# Patient Record
Sex: Female | Born: 1935 | Race: White | Hispanic: No | Smoking: Never smoker
Health system: Southern US, Community
[De-identification: ages and names within clinical notes are randomized; demographics above are authoritative.]

## PROBLEM LIST (undated history)

## (undated) DIAGNOSIS — J189 Pneumonia, unspecified organism: Secondary | ICD-10-CM

## (undated) DIAGNOSIS — I340 Nonrheumatic mitral (valve) insufficiency: Secondary | ICD-10-CM

## (undated) DIAGNOSIS — R569 Unspecified convulsions: Secondary | ICD-10-CM

## (undated) DIAGNOSIS — G40909 Epilepsy, unspecified, not intractable, without status epilepticus: Secondary | ICD-10-CM

## (undated) DIAGNOSIS — Z87442 Personal history of urinary calculi: Secondary | ICD-10-CM

## (undated) DIAGNOSIS — I4891 Unspecified atrial fibrillation: Secondary | ICD-10-CM

## (undated) DIAGNOSIS — K219 Gastro-esophageal reflux disease without esophagitis: Secondary | ICD-10-CM

## (undated) DIAGNOSIS — H3530 Unspecified macular degeneration: Secondary | ICD-10-CM

## (undated) DIAGNOSIS — F32A Depression, unspecified: Secondary | ICD-10-CM

## (undated) DIAGNOSIS — G8929 Other chronic pain: Secondary | ICD-10-CM

## (undated) DIAGNOSIS — M069 Rheumatoid arthritis, unspecified: Secondary | ICD-10-CM

## (undated) DIAGNOSIS — E785 Hyperlipidemia, unspecified: Secondary | ICD-10-CM

## (undated) DIAGNOSIS — C801 Malignant (primary) neoplasm, unspecified: Secondary | ICD-10-CM

## (undated) DIAGNOSIS — M1990 Unspecified osteoarthritis, unspecified site: Secondary | ICD-10-CM

## (undated) DIAGNOSIS — M353 Polymyalgia rheumatica: Secondary | ICD-10-CM

## (undated) DIAGNOSIS — F419 Anxiety disorder, unspecified: Secondary | ICD-10-CM

## (undated) DIAGNOSIS — I10 Essential (primary) hypertension: Secondary | ICD-10-CM

## (undated) DIAGNOSIS — D649 Anemia, unspecified: Secondary | ICD-10-CM

## (undated) HISTORY — PX: TONSILLECTOMY: SUR1361

## (undated) HISTORY — PX: BREAST SURGERY: SHX581

## (undated) HISTORY — PX: ABDOMINAL HYSTERECTOMY: SHX81

## (undated) HISTORY — PX: CARPAL TUNNEL RELEASE: SHX101

---

## 1998-09-08 ENCOUNTER — Encounter: Admission: RE | Admit: 1998-09-08 | Discharge: 1998-10-02 | Payer: Self-pay | Admitting: Family Medicine

## 1999-02-03 ENCOUNTER — Other Ambulatory Visit: Admission: RE | Admit: 1999-02-03 | Discharge: 1999-02-03 | Payer: Self-pay | Admitting: Family Medicine

## 2000-03-11 ENCOUNTER — Encounter: Payer: Self-pay | Admitting: Family Medicine

## 2000-03-11 ENCOUNTER — Encounter: Admission: RE | Admit: 2000-03-11 | Discharge: 2000-03-11 | Payer: Self-pay | Admitting: Family Medicine

## 2002-10-22 ENCOUNTER — Ambulatory Visit (HOSPITAL_COMMUNITY): Admission: RE | Admit: 2002-10-22 | Discharge: 2002-10-22 | Payer: Self-pay | Admitting: Gastroenterology

## 2004-11-27 ENCOUNTER — Encounter: Admission: RE | Admit: 2004-11-27 | Discharge: 2004-11-27 | Payer: Self-pay | Admitting: Family Medicine

## 2004-12-05 ENCOUNTER — Encounter: Admission: RE | Admit: 2004-12-05 | Discharge: 2004-12-05 | Payer: Self-pay | Admitting: Family Medicine

## 2005-02-01 ENCOUNTER — Encounter: Admission: RE | Admit: 2005-02-01 | Discharge: 2005-02-01 | Payer: Self-pay | Admitting: Orthopedic Surgery

## 2005-02-02 ENCOUNTER — Ambulatory Visit (HOSPITAL_COMMUNITY): Admission: RE | Admit: 2005-02-02 | Discharge: 2005-02-02 | Payer: Self-pay | Admitting: Orthopedic Surgery

## 2005-02-02 ENCOUNTER — Ambulatory Visit (HOSPITAL_BASED_OUTPATIENT_CLINIC_OR_DEPARTMENT_OTHER): Admission: RE | Admit: 2005-02-02 | Discharge: 2005-02-02 | Payer: Self-pay | Admitting: Orthopedic Surgery

## 2006-06-27 ENCOUNTER — Encounter: Admission: RE | Admit: 2006-06-27 | Discharge: 2006-06-27 | Payer: Self-pay | Admitting: Family Medicine

## 2008-04-03 ENCOUNTER — Emergency Department (HOSPITAL_COMMUNITY): Admission: EM | Admit: 2008-04-03 | Discharge: 2008-04-03 | Payer: Self-pay | Admitting: Family Medicine

## 2008-10-17 ENCOUNTER — Encounter: Admission: RE | Admit: 2008-10-17 | Discharge: 2008-10-17 | Payer: Self-pay | Admitting: Gastroenterology

## 2009-12-31 ENCOUNTER — Emergency Department (HOSPITAL_BASED_OUTPATIENT_CLINIC_OR_DEPARTMENT_OTHER): Admission: EM | Admit: 2009-12-31 | Discharge: 2009-12-31 | Payer: Self-pay | Admitting: Emergency Medicine

## 2009-12-31 ENCOUNTER — Ambulatory Visit: Payer: Self-pay | Admitting: Diagnostic Radiology

## 2010-01-03 ENCOUNTER — Ambulatory Visit (HOSPITAL_COMMUNITY): Admission: RE | Admit: 2010-01-03 | Discharge: 2010-01-03 | Payer: Self-pay | Admitting: Orthopedic Surgery

## 2010-08-22 LAB — CBC
HCT: 40.2 % (ref 36.0–46.0)
MCH: 30.9 pg (ref 26.0–34.0)
MCHC: 34 g/dL (ref 30.0–36.0)
Platelets: 157 10*3/uL (ref 150–400)
RDW: 13.7 % (ref 11.5–15.5)
WBC: 8.8 10*3/uL (ref 4.0–10.5)

## 2010-08-22 LAB — COMPREHENSIVE METABOLIC PANEL
Alkaline Phosphatase: 99 U/L (ref 39–117)
BUN: 8 mg/dL (ref 6–23)
CO2: 26 mEq/L (ref 19–32)
Chloride: 107 mEq/L (ref 96–112)
GFR calc Af Amer: >60 mL/min (ref >60)
GFR calc non Af Amer: >60 mL/min (ref >60)
Sodium: 141 mEq/L (ref 135–145)
Total Protein: 6.7 g/dL (ref 6.0–8.3)

## 2010-08-22 LAB — DIFFERENTIAL
Basophils Relative: 0 % (ref 0–1)
Eosinophils Relative: 2 % (ref 0–5)
Lymphs Abs: 4.2 10*3/uL — ABNORMAL HIGH (ref 0.7–4.0)
Monocytes Relative: 6 % (ref 3–12)
Neutro Abs: 3.9 10*3/uL (ref 1.7–7.7)

## 2010-08-22 LAB — SURGICAL PCR SCREEN

## 2010-10-23 NOTE — Op Note (Signed)
NAMEJAMESETTA, [name]             ACCOUNT NO.:  [id number]   MEDICAL RECORD NO.:  [id number]          PATIENT TYPE:  AMB   LOCATION:  DSC                          FACILITY:  [facility]   PHYSICIAN:  Leonides Grills, M.D.     DATE OF BIRTH:  1929/10/09   DATE OF PROCEDURE:  02/02/2005  DATE OF DISCHARGE:                                 OPERATIVE REPORT   PREOPERATIVE DIAGNOSIS:  Left second, third and fourth hammer toes.   POSTOPERATIVE DIAGNOSIS:  Left second, third and fourth hammer toes.   OPERATIONS:  1.  Left second, third and fourth toe metatarsophalangeal joint dorsal      capsulotomies with collateral release.  2.  Left second, third and fourth toe proximal phalanx head resections.  3.  Left second, third and fourth toe flexor digitorum longus to proximal      phalanx tendon transfers.  4.  Left second, third and fourth toe extensor digitorum brevis to extensor      digitorum longus tendon transfers.   ANESTHESIA:  General with block.   SURGEON:  Leonides Grills, M.D.   ASSISTANT:  Lianne Cure, P.A.   ESTIMATED BLOOD LOSS:  Minimal.   TOURNIQUET TIME:  Approximately 45 minutes.   COMPLICATIONS:  None.   DISPOSITION:  Stable to the PAR.   INDICATIONS:  This is a 77 year old female who has had longstanding left  hammer toe pain that was interfering with her life to the point that she  cannot do what she wants to do.  She was consented for the above procedure.  All risks, which include infection, nerve or vessel injury, persistent pain,  worse pain, prolonged recovery, stiffness, recurrence of deformity, were all  explained.  Questions were encouraged and answered.   OPERATION:  The patient was brought to the operating room and placed in  supine position after adequate general endotracheal tube anesthesia with  block was administered as well as Ancef 1 g IV piggyback.  The left lower  extremity was then prepped and draped in a sterile manner over a proximally-  placed  thigh tourniquet, and the limb was gravity-exsanguinated.  The  tourniquet was elevated to 290 mmHg.  A longitudinal incision over the  second toe was then made.  Dissection was carried down through skin, and  hemostasis was obtained.  The extensor digitorum longus and brevis tendons  were identified and tenotomized proximal medial and the brevis distal  lateral and retracted out of harm's way for later transfer.  MTP joint  dorsal capsulotomy was then performed with a 15 blade scalpel, protecting  soft tissues both medially and lateral by our system, and collateral  ligaments were released as well.  The distal aspect of the proximal phalanx  was then skeletonized sharply, protecting the soft tissues medially and  laterally, and the head was then removed with a rongeur, followed by a bone  cutter.  The FDL tendon was then identified by a longitudinal incision in  the plantar plate and once this was identified, this was tenotomized as  distal as possible for later transfer.  We then made consecutive drill  holes, 2.5-3.2 mm drill holes in the base of the proximal phalanx.  The FDL  tendon was then pulled from plantar to dorsal through the drill hole.  We  then placed a 0.045 K-wire antegrade through the middle and distal phalanx,  reduced the PIP joint with the toe held in a reduced position with the ankle  in neutral dorsiflexion and tension on the FDL tendon through the drill  hole.  The K-wire was then fired across the MTP joint.  This held the toe in  excellent position.  We then completed the transfer with the EDB to EDL  using 3-0 PDS suture, and this was sewn to the stump of the FDL tendon as  well.  This had outstanding repair.  We did the same exact procedure to the  third and fourth toes, respectively, through separate incisions.  Once all  toes were completed, the tourniquet was deflated and hemostasis was  obtained.  The toes pinked up nicely and bleeding out the tip of the toe.   Skin-relieving incisions were made on either side of the K-wire.  K-wires  were bent, cut and capped.  Wounds were closed with 4-0 nylon suture over  all wounds.  Sterile dressing was applied and a hard-soled shoe was applied.  The patient was stable to the PAR.      Leonides Grills, M.D.  Electronically Signed     PB/MEDQ  D:  02/02/2005  T:  02/02/2005  Job:  [id number]

## 2010-10-23 NOTE — Op Note (Signed)
   [name], [name]                       ACCOUNT NO.:  [id number]   MEDICAL RECORD NO.:  [id number]                   PATIENT TYPE:  AMB   LOCATION:  ENDO                                 FACILITY:  [facility]   PHYSICIAN:  John C. Madilyn Fireman, M.D.                 DATE OF BIRTH:  1929/08/26   DATE OF PROCEDURE:  10/22/2002  DATE OF DISCHARGE:                                 OPERATIVE REPORT   PROCEDURE:  Colonoscopy.   INDICATIONS FOR PROCEDURE:  Colon cancer screening.   DESCRIPTION OF PROCEDURE:  The patient was placed in the left lateral  decubitus position then placed on the pulse monitor with continuous low flow  oxygen delivered by nasal cannula. She was sedated with 50 mcg IV fentanyl  and 6 mg IV Versed. The Olympus video colonoscope was inserted into the  rectum and advanced to the cecum, confirmed by transillumination at  McBurney's point and visualization of the ileocecal valve and appendiceal  orifice. The prep was excellent. The cecum, ascending, transverse,  descending and sigmoid colon all appeared normal with no masses, polyps,  diverticula or other mucosal abnormalities. The rectum likewise appeared  normal and retroflexed view of the anus revealed no obvious internal  hemorrhoids. The colonoscope was then withdrawn and the patient returned to  the recovery room in stable condition. The patient tolerated the procedure  well and there were no immediate complications.   IMPRESSION:  Normal screening colonoscopy.   PLAN:  Next colon screening by sigmoidoscopy in five years.                                               John C. Madilyn Fireman, M.D.    JCH/MEDQ  D:  10/22/2002  T:  10/22/2002  Job:  [id number]   cc:   Talmadge Coventry, M.D.  [address], [address]  [place]  [place] [address]  Fax: [phone]

## 2013-03-07 ENCOUNTER — Other Ambulatory Visit: Payer: Self-pay | Admitting: Gastroenterology

## 2013-03-07 DIAGNOSIS — R109 Unspecified abdominal pain: Secondary | ICD-10-CM

## 2013-03-09 ENCOUNTER — Ambulatory Visit
Admission: RE | Admit: 2013-03-09 | Discharge: 2013-03-09 | Disposition: A | Discharge status: Home / Self Care | Payer: Medicare Other | Source: Ambulatory Visit | Attending: Gastroenterology | Admitting: Gastroenterology

## 2013-03-09 DIAGNOSIS — R109 Unspecified abdominal pain: Secondary | ICD-10-CM

## 2014-11-28 ENCOUNTER — Other Ambulatory Visit: Payer: Self-pay | Admitting: Gastroenterology

## 2014-11-28 DIAGNOSIS — R1310 Dysphagia, unspecified: Secondary | ICD-10-CM

## 2014-12-04 ENCOUNTER — Ambulatory Visit
Admission: RE | Admit: 2014-12-04 | Discharge: 2014-12-04 | Disposition: A | Discharge status: Home / Self Care | Payer: Medicare Other | Source: Ambulatory Visit | Attending: Gastroenterology | Admitting: Gastroenterology

## 2014-12-04 DIAGNOSIS — R1310 Dysphagia, unspecified: Secondary | ICD-10-CM

## 2015-02-11 ENCOUNTER — Encounter (INDEPENDENT_AMBULATORY_CARE_PROVIDER_SITE_OTHER): Payer: Medicare Other | Admitting: Ophthalmology

## 2015-02-11 DIAGNOSIS — H3531 Nonexudative age-related macular degeneration: Secondary | ICD-10-CM

## 2015-02-11 DIAGNOSIS — H43813 Vitreous degeneration, bilateral: Secondary | ICD-10-CM

## 2017-05-23 ENCOUNTER — Other Ambulatory Visit: Payer: Self-pay | Admitting: Physician Assistant

## 2017-05-23 DIAGNOSIS — R1012 Left upper quadrant pain: Secondary | ICD-10-CM

## 2017-05-23 DIAGNOSIS — R1011 Right upper quadrant pain: Secondary | ICD-10-CM

## 2017-05-27 ENCOUNTER — Ambulatory Visit
Admission: RE | Admit: 2017-05-27 | Discharge: 2017-05-27 | Disposition: A | Discharge status: Home / Self Care | Payer: Medicare Other | Source: Ambulatory Visit | Attending: Physician Assistant | Admitting: Physician Assistant

## 2017-05-27 DIAGNOSIS — R1011 Right upper quadrant pain: Secondary | ICD-10-CM

## 2017-05-27 DIAGNOSIS — R1012 Left upper quadrant pain: Secondary | ICD-10-CM

## 2018-07-09 ENCOUNTER — Encounter (HOSPITAL_BASED_OUTPATIENT_CLINIC_OR_DEPARTMENT_OTHER): Payer: Self-pay | Admitting: Emergency Medicine

## 2018-07-09 ENCOUNTER — Emergency Department (HOSPITAL_BASED_OUTPATIENT_CLINIC_OR_DEPARTMENT_OTHER)
Admission: EM | Admit: 2018-07-09 | Discharge: 2018-07-09 | Disposition: A | Discharge status: Home / Self Care | Payer: Medicare Other | Attending: Emergency Medicine | Admitting: Emergency Medicine

## 2018-07-09 ENCOUNTER — Other Ambulatory Visit: Payer: Self-pay

## 2018-07-09 ENCOUNTER — Emergency Department (HOSPITAL_BASED_OUTPATIENT_CLINIC_OR_DEPARTMENT_OTHER): Payer: Medicare Other

## 2018-07-09 DIAGNOSIS — W010XXA Fall on same level from slipping, tripping and stumbling without subsequent striking against object, initial encounter: Secondary | ICD-10-CM | POA: Diagnosis not present

## 2018-07-09 DIAGNOSIS — Y929 Unspecified place or not applicable: Secondary | ICD-10-CM | POA: Diagnosis not present

## 2018-07-09 DIAGNOSIS — S86911A Strain of unspecified muscle(s) and tendon(s) at lower leg level, right leg, initial encounter: Secondary | ICD-10-CM | POA: Insufficient documentation

## 2018-07-09 DIAGNOSIS — S8001XA Contusion of right knee, initial encounter: Secondary | ICD-10-CM | POA: Insufficient documentation

## 2018-07-09 DIAGNOSIS — Y999 Unspecified external cause status: Secondary | ICD-10-CM | POA: Diagnosis not present

## 2018-07-09 DIAGNOSIS — S0083XA Contusion of other part of head, initial encounter: Secondary | ICD-10-CM | POA: Diagnosis not present

## 2018-07-09 DIAGNOSIS — Y939 Activity, unspecified: Secondary | ICD-10-CM | POA: Insufficient documentation

## 2018-07-09 DIAGNOSIS — W19XXXA Unspecified fall, initial encounter: Secondary | ICD-10-CM

## 2018-07-09 DIAGNOSIS — S0990XA Unspecified injury of head, initial encounter: Secondary | ICD-10-CM

## 2018-07-09 MED ORDER — ONDANSETRON 4 MG PO TBDP
4.0000 mg | ORAL_TABLET | Freq: Once | ORAL | Status: AC
  Administered 2018-07-09: 4 mg via ORAL
  Filled 2018-07-09: qty 1

## 2018-07-09 MED ORDER — ACETAMINOPHEN 500 MG PO TABS: 1000.0000 mg | ORAL_TABLET | Freq: Four times a day (QID) | ORAL | 0 refills | Status: AC | PRN

## 2018-07-09 MED ORDER — ACETAMINOPHEN 500 MG PO TABS
1000.0000 mg | ORAL_TABLET | Freq: Once | ORAL | Status: AC
  Administered 2018-07-09: 1000 mg via ORAL
  Filled 2018-07-09: qty 2

## 2018-07-09 MED ORDER — ONDANSETRON 4 MG PO TBDP: 4.0000 mg | ORAL_TABLET | ORAL | 0 refills | Status: DC | PRN

## 2018-07-09 NOTE — ED Notes (Signed)
Patient transported to CT 

## 2018-07-09 NOTE — ED Notes (Signed)
ED Provider at bedside. 

## 2018-07-09 NOTE — ED Triage Notes (Signed)
Pt had fall on ice this morning and hit her head and right knee. Patient denies LOC with no blood thinners.

## 2018-07-09 NOTE — ED Provider Notes (Signed)
[facility] EMERGENCY DEPARTMENT Provider Note   CSN: [id number] Arrival date & time: 07/09/18  2376     History   Chief Complaint Chief Complaint  Patient presents with  . Fall    HPI [name] is a 85 y.o. female.  HPI Patient slipped on an outside deck incline.  She fell off the edge and struck her head with no loss of consciousness.  She reports she does have generalized headache.  Patient feels nauseated.  No visual changes.  No neck pain.  No weakness numbness or tingling of extremities.  Patient reports she also hit her right knee.  She reports her right knee is painful.  No pain at the hip or lower back.  Patient has been ambulatory.  No anticoagulants. History reviewed. No pertinent past medical history.  There are no active problems to display for this patient.   History reviewed. No pertinent surgical history.   OB History   No obstetric history on file.      Home Medications    Prior to Admission medications   Medication Sig Start Date End Date Taking? Authorizing Provider  amLODipine (NORVASC) 5 MG tablet Take by mouth. 07/03/16  Yes [provider]  celecoxib (CELEBREX) 200 MG capsule Take by mouth. 05/11/16  Yes [provider]  fluticasone (FLONASE) 50 MCG/ACT nasal spray Place into the nose. 02/03/18 02/03/19 Yes [provider]  pantoprazole (PROTONIX) 40 MG tablet Take by mouth. 01/14/14  Yes [provider]  PHENobarbital (LUMINAL) 97.2 MG tablet Take by mouth. 05/17/16  Yes [provider]  predniSONE (DELTASONE) 5 MG tablet Take by mouth. 02/22/18  Yes [provider]  acetaminophen (TYLENOL) 500 MG tablet Take 2 tablets (1,000 mg total) by mouth every 6 (six) hours as needed. 07/09/18   Charlesetta Shanks, MD  Multiple Vitamin (MULTIVITAMIN) tablet Take by mouth.    [provider]  ondansetron (ZOFRAN ODT) 4 MG disintegrating tablet Take 1 tablet (4 mg total) by mouth every 4  (four) hours as needed for nausea or vomiting. 07/09/18   Charlesetta Shanks, MD    Family History History reviewed. No pertinent family history.  Social History Social History   Tobacco Use  . Smoking status: Never Smoker  . Smokeless tobacco: Never Used  Substance Use Topics  . Alcohol use: Never    Frequency: Never  . Drug use: Never     Allergies   Penicillins   Review of Systems Review of Systems 10 Systems reviewed and are negative for acute change except as noted in the HPI.   Physical Exam Updated Vital Signs BP (!) 165/67 (BP Location: Left Arm)   Pulse 60   Temp 98.3 F (36.8 C) (Oral)   Resp 16   Ht 5' 2.5" (1.588 m)   Wt 68 kg   LMP  (LMP Unknown)   SpO2 98%   BMI 27.00 kg/m   Physical Exam Constitutional:      Appearance: She is well-developed.  HENT:     Head:     Comments: 4 cm hematoma to right forehead.  Superficial overlying abrasion.  No other facial trauma.    Nose: Nose normal.     Mouth/Throat:     Mouth: Mucous membranes are moist.     Pharynx: Oropharynx is clear.  Eyes:     Pupils: Pupils are equal, round, and reactive to light.  Neck:     Musculoskeletal: Neck supple.     Comments: No C-spine  tenderness to palpation. Cardiovascular:     Rate and Rhythm: Normal rate and regular rhythm.     Heart sounds: Normal heart sounds.  Pulmonary:     Effort: Pulmonary effort is normal.     Breath sounds: Normal breath sounds.  Abdominal:     General: Bowel sounds are normal. There is no distension.     Palpations: Abdomen is soft.     Tenderness: There is no abdominal tenderness.  Musculoskeletal: Normal range of motion.     Comments: Normal range of motion at the right hip and ankle.  Patient has pain to palpation along the right joint line.  No visible effusion at this time.  Minor contusion to the anterior aspect of the knee without swelling at this time.  Lower leg is soft and nontender.  Skin:    General: Skin is warm and dry.    Neurological:     General: No focal deficit present.     Mental Status: She is alert and oriented to person, place, and time.     GCS: GCS eye subscore is 4. GCS verbal subscore is 5. GCS motor subscore is 6.     Motor: No weakness.     Coordination: Coordination normal.  Psychiatric:        Mood and Affect: Mood normal.      ED Treatments / Results  Labs (all labs ordered are listed, but only abnormal results are displayed) Labs Reviewed - No data to display  EKG None  Radiology Ct Head Wo Contrast  Result Date: 07/09/2018 CLINICAL DATA:  Fall, right forehead injury EXAM: CT HEAD WITHOUT CONTRAST TECHNIQUE: Contiguous axial images were obtained from the base of the skull through the vertex without intravenous contrast. COMPARISON:  05/25/2012 FINDINGS: Brain: No evidence of acute infarction, hemorrhage, hydrocephalus, extra-axial collection or mass lesion/mass effect. Mild cortical atrophy. Old right basal ganglia lacunar infarct. Mild subcortical white matter and periventricular small vessel ischemic changes. Vascular: Mild intracranial atherosclerosis. Skull: Normal. Negative for fracture or focal lesion. Sinuses/Orbits: The visualized paranasal sinuses are essentially clear. The mastoid air cells are unopacified. Other: None. IMPRESSION: No evidence of acute intracranial abnormality. Mild atrophy with small vessel ischemic changes. Old right basal ganglia lacunar infarct. Electronically Signed   By: Julian Hy M.D.   On: 07/09/2018 11:09   Dg Knee Complete 4 Views Right  Result Date: 07/09/2018 CLINICAL DATA:  Fall EXAM: RIGHT KNEE - COMPLETE 4+ VIEW COMPARISON:  None. FINDINGS: No acute fracture. No dislocation. Unremarkable soft tissues. Moderate tricompartment osteoarthritic change. Small joint effusion. IMPRESSION: No acute bony pathology. Electronically Signed   By: Marybelle Killings M.D.   On: 07/09/2018 11:06    Procedures Procedures (including critical care  time)  Medications Ordered in ED Medications  acetaminophen (TYLENOL) tablet 1,000 mg (1,000 mg Oral Given 07/09/18 1036)  ondansetron (ZOFRAN-ODT) disintegrating tablet 4 mg (4 mg Oral Given 07/09/18 1037)     Initial Impression / Assessment and Plan / ED Course  I have reviewed the triage vital signs and the nursing notes.  Pertinent labs & imaging results that were available during my care of the patient were reviewed by me and considered in my medical decision making (see chart for details).     Patient has mechanical fall with head injury without loss of consciousness not on anticoagulants.  Normal mental status.  Recommend Tylenol for headache.  Follow-up plan reviewed.  Return precautions reviewed.  Patient also has right knee sprain.  Reviewed management  and follow-up plan.  Final Clinical Impressions(s) / ED Diagnoses   Final diagnoses:  Fall, initial encounter  Contusion of face, initial encounter  Minor head injury, initial encounter  Contusion of right knee, initial encounter  Strain of right knee, initial encounter    ED Discharge Orders         Ordered    acetaminophen (TYLENOL) 500 MG tablet  Every 6 hours PRN     07/09/18 1140    ondansetron (ZOFRAN ODT) 4 MG disintegrating tablet  Every 4 hours PRN     07/09/18 1140           Charlesetta Shanks, MD 07/09/18 1143

## 2018-12-28 ENCOUNTER — Ambulatory Visit: Payer: Medicare Other | Attending: Specialist | Admitting: Physical Therapy

## 2018-12-28 ENCOUNTER — Encounter: Payer: Self-pay | Admitting: Physical Therapy

## 2018-12-28 ENCOUNTER — Other Ambulatory Visit: Payer: Self-pay

## 2018-12-28 DIAGNOSIS — R262 Difficulty in walking, not elsewhere classified: Secondary | ICD-10-CM | POA: Insufficient documentation

## 2018-12-28 DIAGNOSIS — R2681 Unsteadiness on feet: Secondary | ICD-10-CM | POA: Diagnosis present

## 2018-12-28 DIAGNOSIS — M25561 Pain in right knee: Secondary | ICD-10-CM | POA: Insufficient documentation

## 2018-12-28 DIAGNOSIS — M6281 Muscle weakness (generalized): Secondary | ICD-10-CM | POA: Insufficient documentation

## 2018-12-28 DIAGNOSIS — M25661 Stiffness of right knee, not elsewhere classified: Secondary | ICD-10-CM | POA: Diagnosis present

## 2018-12-28 NOTE — Therapy (Signed)
[facility] [address]  [place], [place], [address] Phone: [phone]   Fax:  [phone]  Physical Therapy Evaluation  Patient Details  Name: [name] MRN: [id number] Date of Birth: 07-26-1929 Referring Provider (PT): Sydnee Cabal, MD   Encounter Date: 12/28/2018  PT End of Session - 12/28/18 1149    Visit Number  1    Number of Visits  13    Date for PT Re-Evaluation  02/08/19    Authorization Type  [employer] Medicare    PT Start Time  1105    PT Stop Time  1144    PT Time Calculation (min)  39 min    Activity Tolerance  Patient tolerated treatment well;Patient limited by pain    Behavior During Therapy  [facility] for tasks assessed/performed       History reviewed. No pertinent past medical history.  History reviewed. No pertinent surgical history.  There were no vitals filed for this visit.   Subjective Assessment - 12/28/18 1106    Subjective  Patient reports that this past Saturday she fell, landing on R knee and bumping her head. Had to have her daughter to help her get up from the floor. Denies HA, dizziness, nausea/vomiting since the fall. R knee is bruised and painful in medial and lateral aspects of knee. Worse with prolonged walking, stairs. Better with ice and elevation. Reports her balance is not good- feels wobbly when walking to her mailbox, trying to walk in a straight line.    Pertinent History  RA, GERD, epilepsy, anemia, HLD, polymyalgia rheumatica, HTN, hx breast CA    Limitations  Lifting;Standing;Walking;House hold activities    How long can you sit comfortably?  unlimited    How long can you stand comfortably?  1 hour    How long can you walk comfortably?  1/4 mile    Diagnostic tests  per patient- had xray which showed arthritis    Patient Stated Goals  get better at walking and keep that joint mobile    Currently in Pain?  Yes    Pain Score  0-No pain    Pain Location  Knee    Pain Orientation  Right;Medial;Lateral    Pain Type  Acute pain         [facility] PT Assessment - 12/28/18 1118      Assessment   Medical Diagnosis  Pain in R knee    Referring Provider (PT)  Sydnee Cabal, MD    Onset Date/Surgical Date  12/23/18    Next MD Visit  02/05/19    Prior Therapy  yes      Balance Screen   Has the patient fallen in the past 6 months  Yes    How many times?  1    Has the patient had a decrease in activity level because of a fear of falling?   No    Is the patient reluctant to leave their home because of a fear of falling?   No      Home Environment   Living Environment  Private residence    Living Arrangements  Alone    Available Help at Discharge  Family    Type of [place] - single point;Cane - quad;Walker - 4 wheels;Transport chair;Wheelchair - power;Shower seat  Prior Function   Level of Independence  Independent    Vocation  Retired    Leisure  walking for exercise      Cognition   Overall Cognitive Status  Within Functional Limits for tasks assessed      Observation/Other Assessments   Observations  bruising to lateral aspect of R patella; edema to medial aspect    Focus on Therapeutic Outcomes (FOTO)   Knee: 56 (44% limited, 32% predicted)      Sensation   Light Touch  Appears Intact      Coordination   Gross Motor Movements are Fluid and Coordinated  Yes      Posture/Postural Control   Posture/Postural Control  Postural limitations    Postural Limitations  Rounded Shoulders;Forward head;Weight shift left;Posterior pelvic tilt      ROM / Strength   AROM / PROM / Strength  AROM;PROM;Strength      AROM   AROM Assessment Site  Knee    Right/Left Knee  Left;Right    Right Knee Extension  3   pain   Right Knee Flexion  114   pain   Left Knee Extension  0    Left Knee Flexion  120      PROM   PROM Assessment Site  Knee    Right/Left Knee   Left;Right    Right Knee Extension  1   pain   Right Knee Flexion  115   pain   Left Knee Extension  0    Left Knee Flexion  123      Strength   Strength Assessment Site  Hip;Knee;Ankle    Right/Left Hip  Right;Left    Right Hip Flexion  4+/5    Right Hip ABduction  4/5    Right Hip ADduction  4/5    Left Hip Flexion  4/5    Left Hip ABduction  4+/5    Left Hip ADduction  4/5    Right/Left Knee  Right;Left    Right Knee Flexion  4/5    Right Knee Extension  4+/5    Left Knee Flexion  4-/5    Left Knee Extension  4+/5    Right/Left Ankle  Right;Left    Right Ankle Dorsiflexion  4+/5    Right Ankle Plantar Flexion  4/5    Left Ankle Dorsiflexion  4+/5    Left Ankle Plantar Flexion  4/5      Palpation   Patella mobility  R patella hypomobile in all directions    Palpation comment  R medial and lateral jt line tenderness as well as pes anserine and lateral aspect of knee      Ambulation/Gait   Assistive device  None    Gait Pattern  Step-to pattern;Step-through pattern;Decreased stance time - right;Decreased step length - left;Decreased hip/knee flexion - right;Decreased weight shift to right;Antalgic   increased R knee valgus and ankle pronation on R   Ambulation Surface  Level;Indoor    Gait velocity  decreased                Objective measurements completed on examination: See above findings.              PT Education - 12/28/18 1149    Education Details  prognosis, POC, HEP    Person(s) Educated  Patient    Methods  Explanation;Demonstration;Tactile cues;Verbal cues;Handout    Comprehension  Verbalized understanding;Returned demonstration       PT Short Term Goals - 12/28/18  [place] #1   Title  Patient to be independent with initial HEP.    Time  3    Period  Weeks    Status  New    Target Date  01/18/19        PT Long Term Goals - 12/28/18 1155      PT LONG TERM GOAL #1   Title  Patient to be independent with  advanced HEP.    Time  6    Period  Weeks    Status  New    Target Date  02/08/19      PT LONG TERM GOAL #2   Title  Patient to demonstrate >=4+/5 strength in B LEs.    Time  6    Period  Weeks    Status  New    Target Date  02/08/19      PT LONG TERM GOAL #3   Title  Patient to demonstrate R knee AROM nonpainful and symmetrical to opposite LE.    Time  6    Period  Weeks    Status  New    Target Date  02/08/19      PT LONG TERM GOAL #4   Title  Patient to demonstrate floor to stand transfer with min A.    Time  6    Period  Weeks    Status  New    Target Date  02/08/19      PT LONG TERM GOAL #5   Title  Patient to score >19/24 on DGI to decrease risk of falls.    Time  6    Period  Weeks    Status  New    Target Date  02/08/19             Plan - 12/28/18 1149    Clinical Impression Statement  Patient is an 85y/o F presenting to OPPT with c/o R knee pain after a fall on 12/23/18. Reports pain is located over medial and lateral aspects of knee. Worse with prolonged walking, stairs. Also notes that her balance is not good- feels wobbly when walking to her mailbox and trying to walk in a straight line. Patient today with visible bruising and edema to R knee, decreased and painful R knee ROM, decreased LE strength, decreased R patellar mobility, and gait deviations. Educated patient on gentle strengthening, stretching, and balance HEP- advised patient to use a chair for standing exercises d/t patient's c/o buckling and catching in her knee. Patient reported understanding. Would benefit form skilled PT services 2x/week for 6 weeks to address aforementioned impairments.    Personal Factors and Comorbidities  Age;Comorbidity 3+;Time since onset of injury/illness/exacerbation;Past/Current Experience;Fitness    Comorbidities  RA, GERD, epilepsy, anemia, HLD, polymyalgia rheumatica, HTN, hx breast CA    Examination-Activity Limitations  Bathing;Bend;Squat;Caring for  Others;Stairs;Carry;Stand;Lift;Locomotion Level;Reach Overhead;Transfers    Examination-Participation Restrictions  Church;Cleaning;Shop;Community Activity;Driving;Yard Work;Interpersonal Relationship;Laundry;Meal Prep    Stability/Clinical Decision Making  Stable/Uncomplicated    Clinical Decision Making  Low    Rehab Potential  Good    PT Frequency  2x / week    PT Duration  6 weeks    PT Treatment/Interventions  ADLs/Self Care Home Management;Cryotherapy;Electrical Stimulation;Iontophoresis 4mg /ml Dexamethasone;Moist Heat;Balance training;Therapeutic exercise;Therapeutic activities;Functional mobility training;Stair training;Gait training;Ultrasound;Neuromuscular re-education;Patient/family education;Manual techniques;Vasopneumatic Device;Taping;Energy conservation;Dry needling;Passive range of motion    PT Next Visit Plan  reassess HEP; DGI    Consulted and Agree with Plan of Care  Patient       Patient will benefit from skilled therapeutic intervention in order to improve the following deficits and impairments:  Abnormal gait, Hypomobility, Increased edema, Decreased activity tolerance, Decreased strength, Pain, Difficulty walking, Decreased balance, Decreased range of motion, Improper body mechanics, Postural dysfunction, Impaired flexibility  Visit Diagnosis: 1. Acute pain of right knee   2. Stiffness of right knee, not elsewhere classified   3. Muscle weakness (generalized)   4. Difficulty in walking, not elsewhere classified   5. Unsteadiness on feet        Problem List There are no active problems to display for this patient.   Janene Harvey, PT, DPT 12/28/18 11:58 AM   [facility] [address]  [place], [place], [address] Phone: [phone]   Fax:  [phone]  Name: [name] MRN: [id number] Date of Birth: [birth year]

## 2019-01-02 ENCOUNTER — Other Ambulatory Visit: Payer: Self-pay

## 2019-01-02 ENCOUNTER — Other Ambulatory Visit: Payer: Self-pay | Admitting: Chiropractic Medicine

## 2019-01-02 ENCOUNTER — Ambulatory Visit: Payer: Medicare Other

## 2019-01-02 DIAGNOSIS — M25661 Stiffness of right knee, not elsewhere classified: Secondary | ICD-10-CM

## 2019-01-02 DIAGNOSIS — M5416 Radiculopathy, lumbar region: Secondary | ICD-10-CM

## 2019-01-02 DIAGNOSIS — M6281 Muscle weakness (generalized): Secondary | ICD-10-CM

## 2019-01-02 DIAGNOSIS — M25561 Pain in right knee: Secondary | ICD-10-CM

## 2019-01-02 DIAGNOSIS — R2681 Unsteadiness on feet: Secondary | ICD-10-CM

## 2019-01-02 DIAGNOSIS — R262 Difficulty in walking, not elsewhere classified: Secondary | ICD-10-CM

## 2019-01-02 NOTE — Therapy (Signed)
[facility] [address]  [place], [place], [address] Phone: [phone]   Fax:  [phone]  Physical Therapy Treatment  Patient Details  Name: [name] MRN: [id number] Date of Birth: [birth year] Referring Provider (PT): Sydnee Cabal, MD   Encounter Date: 01/02/2019  PT End of Session - 01/02/19 1118    Visit Number  2    Number of Visits  13    Date for PT Re-Evaluation  02/08/19    Authorization Type  UHC Medicare    PT Start Time  1103    PT Stop Time  1200    PT Time Calculation (min)  57 min    Activity Tolerance  Patient tolerated treatment well;Patient limited by pain    Behavior During Therapy  [facility] for tasks assessed/performed       No past medical history on file.  No past surgical history on file.  There were no vitals filed for this visit.  Subjective Assessment - 01/02/19 1120    Subjective  Doing well.  Pt. reporting she was able to walk around [employer] yesterday for 45 min    Pertinent History  RA, GERD, epilepsy, anemia, HLD, polymyalgia rheumatica, HTN, hx breast CA    Diagnostic tests  per patient- had xray which showed arthritis    Patient Stated Goals  get better at walking and keep that joint mobile    Currently in Pain?  No/denies    Pain Score  0-No pain         [facility] PT Assessment - 01/02/19 0001      AROM   AROM Assessment Site  Knee    Right/Left Knee  Left;Right    Right Knee Flexion  124      Standardized Balance Assessment   Standardized Balance Assessment  Dynamic Gait Index      Dynamic Gait Index   Level Surface  Normal    Change in Gait Speed  Mild Impairment    Gait with Horizontal Head Turns  Mild Impairment    Gait with Vertical Head Turns  Normal    Gait and Pivot Turn  Mild Impairment    Step Over Obstacle  Mild Impairment    Step Around Obstacles  Mild Impairment    Steps  Moderate Impairment    Total Score  17    DGI comment:  17/24                    [facility] Adult PT Treatment/Exercise - 01/02/19 0001      Knee/Hip Exercises: Stretches   Hip Flexor Stretch  Right;30 seconds;2 reps    Hip Flexor Stretch Limitations  mod thomas position with strap       Knee/Hip Exercises: Supine   Quad Sets  Right;10 reps;Strengthening    Quad Sets Limitations  5" hold     Heel Slides  Right;10 reps;AROM    Heel Slides Limitations  towel around foot    Straight Leg Raises  Right;10 reps;Strengthening    Straight Leg Raises Limitations  no visible quad lag    Knee Flexion  Right;10 reps;AAROM    Knee Flexion Limitations  peanut p-ball with strap       Modalities   Modalities  Vasopneumatic      Vasopneumatic   Number Minutes Vasopneumatic   10 minutes    Vasopnuematic Location   Knee    Vasopneumatic Pressure  Low  Vasopneumatic Temperature   coldest temp.       Manual Therapy   Manual Therapy  Joint mobilization    Manual therapy comments  supine    Joint Mobilization  R patellar mobs all directions - min limited superior/inferior               PT Short Term Goals - 01/02/19 1118      PT SHORT TERM GOAL #1   Title  Patient to be independent with initial HEP.    Time  3    Period  Weeks    Status  On-going    Target Date  01/18/19        PT Long Term Goals - 01/02/19 1119      PT LONG TERM GOAL #1   Title  Patient to be independent with advanced HEP.    Time  6    Period  Weeks    Status  On-going      PT LONG TERM GOAL #2   Title  Patient to demonstrate >=4+/5 strength in B LEs.    Time  6    Period  Weeks    Status  On-going      PT LONG TERM GOAL #3   Title  Patient to demonstrate R knee AROM nonpainful and symmetrical to opposite LE.    Time  6    Period  Weeks    Status  On-going      PT LONG TERM GOAL #4   Title  Patient to demonstrate floor to stand transfer with min A.    Time  6    Period  Weeks    Status  On-going      PT LONG TERM GOAL #5   Title  Patient to score  >19/24 on DGI to decrease risk of falls.    Time  6    Period  Weeks    Status  On-going            Plan - 01/02/19 1212    Clinical Impression Statement  [place] reporting some improvement in R knee pain since starting therapy.  Reviewed HEP with only min correction required for proper technique with quad set.  Pt. demonstrating some unsteadiness with multitasking with gait as evident with score of 17/24 on Dynamic Gait Index today. Pt. most unsteady with step-over activities and horizontal head turns with overt stray off straight line gait pattern.  Was able to demonstrate much improved R knee flexion ROM to 124 dg today and able to demo AROM extension to 0dg with quad set.  progressing well toward established goals.    Personal Factors and Comorbidities  Age;Comorbidity 3+;Time since onset of injury/illness/exacerbation;Past/Current Experience;Fitness    Comorbidities  RA, GERD, epilepsy, anemia, HLD, polymyalgia rheumatica, HTN, hx breast CA    Rehab Potential  Good    PT Treatment/Interventions  ADLs/Self Care Home Management;Cryotherapy;Electrical Stimulation;Iontophoresis 4mg /ml Dexamethasone;Moist Heat;Balance training;Therapeutic exercise;Therapeutic activities;Functional mobility training;Stair training;Gait training;Ultrasound;Neuromuscular re-education;Patient/family education;Manual techniques;Vasopneumatic Device;Taping;Energy conservation;Dry needling;Passive range of motion    PT Next Visit Plan  Review tandem balance at counter top    Consulted and Agree with Plan of Care  Patient       Patient will benefit from skilled therapeutic intervention in order to improve the following deficits and impairments:  Abnormal gait, Hypomobility, Increased edema, Decreased activity tolerance, Decreased strength, Pain, Difficulty walking, Decreased balance, Decreased range of motion, Improper body mechanics, Postural dysfunction, Impaired flexibility  Visit Diagnosis: 1. Acute pain  of  right knee   2. Stiffness of right knee, not elsewhere classified   3. Muscle weakness (generalized)   4. Difficulty in walking, not elsewhere classified   5. Unsteadiness on feet        Problem List There are no active problems to display for this patient.   Bess Harvest, PTA 01/02/19 12:26 PM   [facility] [address]  [place], [place], [address] Phone: [phone]   Fax:  [phone]  Name: YAHIRA TIMBERMAN MRN: [id number] Date of Birth: [birth year]

## 2019-01-04 ENCOUNTER — Other Ambulatory Visit: Payer: Self-pay

## 2019-01-04 ENCOUNTER — Encounter: Payer: Self-pay | Admitting: Physical Therapy

## 2019-01-04 ENCOUNTER — Ambulatory Visit: Payer: Medicare Other | Admitting: Physical Therapy

## 2019-01-04 DIAGNOSIS — M25561 Pain in right knee: Secondary | ICD-10-CM

## 2019-01-04 DIAGNOSIS — M6281 Muscle weakness (generalized): Secondary | ICD-10-CM

## 2019-01-04 DIAGNOSIS — M25661 Stiffness of right knee, not elsewhere classified: Secondary | ICD-10-CM

## 2019-01-04 DIAGNOSIS — R262 Difficulty in walking, not elsewhere classified: Secondary | ICD-10-CM

## 2019-01-04 DIAGNOSIS — R2681 Unsteadiness on feet: Secondary | ICD-10-CM

## 2019-01-04 NOTE — Therapy (Signed)
[facility] [address]  [place], [place], [address] Phone: [phone]   Fax:  [phone]  Physical Therapy Treatment  Patient Details  Name: [name] MRN: [id number] Date of Birth: [birth year] Referring Provider (PT): Sydnee Cabal, MD   Encounter Date: 01/04/2019  PT End of Session - 01/04/19 1205    Visit Number  3    Number of Visits  13    Date for PT Re-Evaluation  02/08/19    Authorization Type  UHC Medicare    PT Start Time  1016    PT Stop Time  1100    PT Time Calculation (min)  44 min    Activity Tolerance  Patient tolerated treatment well    Behavior During Therapy  [facility] for tasks assessed/performed       History reviewed. No pertinent past medical history.  History reviewed. No pertinent surgical history.  There were no vitals filed for this visit.  Subjective Assessment - 01/04/19 1018    Subjective  Knee is hurting her this AM. Has been performing HEP.    Pertinent History  RA, GERD, epilepsy, anemia, HLD, polymyalgia rheumatica, HTN, hx breast CA    Diagnostic tests  per patient- had xray which showed arthritis    Patient Stated Goals  get better at walking and keep that joint mobile    Currently in Pain?  Yes    Pain Score  6     Pain Location  Knee    Pain Orientation  Right;Medial;Lateral    Pain Descriptors / Indicators  Aching;Constant         [facility] PT Assessment - 01/04/19 0001      AROM   Right Knee Flexion  120                   [facility] Adult PT Treatment/Exercise - 01/04/19 0001      Exercises   Exercises  Knee/Hip      Knee/Hip Exercises: Stretches   Passive Hamstring Stretch  Right;2 reps;30 seconds    Passive Hamstring Stretch Limitations  supine strap    Hip Flexor Stretch  Right;30 seconds;2 reps    Hip Flexor Stretch Limitations  mod thomas position with strap     Gastroc Stretch  Right;Left;2 reps;30 seconds    Gastroc Stretch Limitations   at counter top      Knee/Hip Exercises: Aerobic   Stationary Bike  L1 x 70min   pillow behind back     Knee/Hip Exercises: Seated   Long Arc Quad  Strengthening;Right;1 set;10 reps;Weights    Long Arc Quad Weight  3 lbs.    Long [employer] Limitations  good control and speed    Sit to [employer]  1 set;10 reps;without UE support   cues for set up and increased eccentric lowering     Knee/Hip Exercises: Supine   Public librarian Limitations  10" hold    Heel Slides  AAROM;Right;1 set;10 reps    Heel Slides Limitations  with strap and pball    Bridges  Strengthening;Both;1 set;10 reps    Bridges Limitations  good speed but with hip instability    Straight Leg Raises  Right;10 reps;Strengthening    Straight Leg Raises Limitations  cues for quad set before each rep             PT Education - 01/04/19 1204    Education Details  update/consolidation of HEP    Person(s) Educated  Patient    Methods  Explanation;Demonstration;Tactile cues;Verbal cues;Handout    Comprehension  Verbalized understanding;Returned demonstration       PT Short Term Goals - 01/04/19 1207      PT SHORT TERM GOAL #1   Title  Patient to be independent with initial HEP.    Time  3    Period  Weeks    Status  Achieved    Target Date  01/18/19        PT Long Term Goals - 01/02/19 1119      PT LONG TERM GOAL #1   Title  Patient to be independent with advanced HEP.    Time  6    Period  Weeks    Status  On-going      PT LONG TERM GOAL #2   Title  Patient to demonstrate >=4+/5 strength in B LEs.    Time  6    Period  Weeks    Status  On-going      PT LONG TERM GOAL #3   Title  Patient to demonstrate R knee AROM nonpainful and symmetrical to opposite LE.    Time  6    Period  Weeks    Status  On-going      PT LONG TERM GOAL #4   Title  Patient to demonstrate floor to stand transfer with min A.    Time  6    Period  Weeks    Status  On-going      PT LONG  TERM GOAL #5   Title  Patient to score >19/24 on DGI to decrease risk of falls.    Time  6    Period  Weeks    Status  On-going            Plan - 01/04/19 1205    Clinical Impression Statement  Patient arrived with no new complaints. Notes that HEP is getting easier and is able to walk better. R knee TTP to distal medial HS at beginning of session and with visible edema to medial knee. Thus, initiated HS stretching for hopeful pain relief and tissue extensibility. Demonstrating 120 degrees of R knee flexion after performing heel slides, now symmetrical with opposite LE. Introduced bridges with patient demonstrating hip instability d/t hip and core weakness. Worked on Astronomer with patient showing difficulty with tandem stance- better ability to perform semi tandem. Thus updated and consolidated HEP with exercises that were well-tolerated today. Patient showing good tolerance for exercise progression. No complaints at end of session.    Comorbidities  RA, GERD, epilepsy, anemia, HLD, polymyalgia rheumatica, HTN, hx breast CA    Rehab Potential  Good    PT Treatment/Interventions  ADLs/Self Care Home Management;Cryotherapy;Electrical Stimulation;Iontophoresis 4mg /ml Dexamethasone;Moist Heat;Balance training;Therapeutic exercise;Therapeutic activities;Functional mobility training;Stair training;Gait training;Ultrasound;Neuromuscular re-education;Patient/family education;Manual techniques;Vasopneumatic Device;Taping;Energy conservation;Dry needling;Passive range of motion    PT Next Visit Plan  progress quad strengthening and balance exercises    Consulted and Agree with Plan of Care  Patient       Patient will benefit from skilled therapeutic intervention in order to improve the following deficits and impairments:  Abnormal gait, Hypomobility, Increased edema, Decreased activity tolerance, Decreased strength, Pain, Difficulty walking, Decreased balance, Decreased range of motion,  Improper body mechanics, Postural dysfunction, Impaired flexibility  Visit Diagnosis: 1. Acute pain of right knee   2. Stiffness of right knee, not elsewhere classified   3. Muscle weakness (  generalized)   4. Difficulty in walking, not elsewhere classified   5. Unsteadiness on feet        Problem List There are no active problems to display for this patient.    Janene Harvey, PT, DPT 01/04/19 12:09 PM   [facility] [address]  [address] [place], [place], [address] Phone: [phone]   Fax:  [phone]  Name: ROSEY EIDE MRN: [id number] Date of Birth: February 11, 1930

## 2019-01-09 ENCOUNTER — Ambulatory Visit: Payer: Medicare Other | Attending: Specialist

## 2019-01-09 ENCOUNTER — Other Ambulatory Visit: Payer: Self-pay

## 2019-01-09 DIAGNOSIS — M6281 Muscle weakness (generalized): Secondary | ICD-10-CM | POA: Insufficient documentation

## 2019-01-09 DIAGNOSIS — M25561 Pain in right knee: Secondary | ICD-10-CM | POA: Diagnosis not present

## 2019-01-09 DIAGNOSIS — R2681 Unsteadiness on feet: Secondary | ICD-10-CM | POA: Insufficient documentation

## 2019-01-09 DIAGNOSIS — R262 Difficulty in walking, not elsewhere classified: Secondary | ICD-10-CM | POA: Diagnosis present

## 2019-01-09 DIAGNOSIS — M25661 Stiffness of right knee, not elsewhere classified: Secondary | ICD-10-CM | POA: Diagnosis present

## 2019-01-09 NOTE — Therapy (Signed)
[facility] [address]  [place], [place], [address] Phone: [phone]   Fax:  [phone]  Physical Therapy Treatment  Patient Details  Name: [name] MRN: [id number] Date of Birth: Jun 27, 1929 Referring Provider (PT): Sydnee Cabal, MD   Encounter Date: 01/09/2019  PT End of Session - 01/09/19 1102    Visit Number  4    Number of Visits  13    Date for PT Re-Evaluation  02/08/19    Authorization Type  UHC Medicare    PT Start Time  1057    PT Stop Time  1145    PT Time Calculation (min)  48 min    Activity Tolerance  Patient tolerated treatment well    Behavior During Therapy  [facility] for tasks assessed/performed       No past medical history on file.  No past surgical history on file.  There were no vitals filed for this visit.  Subjective Assessment - 01/09/19 1101    Subjective  Pt. noting R knee pain is less frequent than it used to be.    Pertinent History  RA, GERD, epilepsy, anemia, HLD, polymyalgia rheumatica, HTN, hx breast CA    Diagnostic tests  per patient- had xray which showed arthritis    Patient Stated Goals  get better at walking and keep that joint mobile    Currently in Pain?  No/denies    Pain Score  0-No pain   pain rising to 3/10   Pain Location  Knee    Pain Orientation  Right;Medial;Lateral    Pain Descriptors / Indicators  Aching    Pain Type  Acute pain    Pain Frequency  Intermittent                       [facility] Adult PT Treatment/Exercise - 01/09/19 0001      Self-Care   Self-Care  Other Self-Care Comments    Other Self-Care Comments   Discussion of current HEP to check for tolerance for updated portion of HEP and to check for understanding and challange level with staggered vs tandem stance balance training activities;  Pt. currently reporting challange with staggered stance balance at counter top      Neuro Re-ed    Neuro Re-ed Details   B tandem  stance balance at chair with intermittent UE support 3 x 10 sec each way; therapist supervision       Knee/Hip Exercises: Stretches   Passive Hamstring Stretch  Right;30 seconds;1 rep    Passive Hamstring Stretch Limitations  supine strap      Knee/Hip Exercises: Aerobic   Stationary Bike  L1 x 37min      Knee/Hip Exercises: Standing   Terminal Knee Extension  Right;15 reps;Theraband;Strengthening;1 set    Theraband Level (Terminal Knee Extension)  Level 3 (Green)    Terminal Knee Extension Limitations  Cues for positioning and TKE      Knee/Hip Exercises: Seated   Long Arc Quad  Right;10 reps;Weights;Strengthening;1 set    [employer] Weight  3 lbs.    Long Arc [employer] for [employer]    Other Seated Knee/Hip Exercises  R seated leg press (1 blue, 1 black band) x 10 rpes       Knee/Hip Exercises: Supine   Straight Leg Raises  Right   x 12 rpes    Straight Leg Raises Limitations  no quad lag initially  in set however fatigues near end       Knee/Hip Exercises: Sidelying   Clams  R clam shell x 10 resp with red TB at knees                PT Short Term Goals - 01/04/19 1207      PT SHORT TERM GOAL #1   Title  Patient to be independent with initial HEP.    Time  3    Period  Weeks    Status  Achieved    Target Date  01/18/19        PT Long Term Goals - 01/02/19 1119      PT LONG TERM GOAL #1   Title  Patient to be independent with advanced HEP.    Time  6    Period  Weeks    Status  On-going      PT LONG TERM GOAL #2   Title  Patient to demonstrate >=4+/5 strength in B LEs.    Time  6    Period  Weeks    Status  On-going      PT LONG TERM GOAL #3   Title  Patient to demonstrate R knee AROM nonpainful and symmetrical to opposite LE.    Time  6    Period  Weeks    Status  On-going      PT LONG TERM GOAL #4   Title  Patient to demonstrate floor to stand transfer with min A.    Time  6    Period  Weeks    Status  On-going      PT LONG TERM  GOAL #5   Title  Patient to score >19/24 on DGI to decrease risk of falls.    Time  6    Period  Weeks    Status  On-going            Plan - 01/09/19 1215    Clinical Impression Statement  Pt. seen to start session ambulating without AD.  Pt. reporting she plans to bring [facility] in to session next visit and therapist briefly instructed pt. in proper SPC height using clinic cane and instructed to use cane on L UE as pt. use on right UE.  Session focused on progression of closed chain quad strengthening and mild progression of tandem stance balance activities.  Pt. with intermittent low-level R knee pain during standing therex which quickly subsided with rest.  Ended visit with pt. reporting R knee pain free thus deferred modalities.  Will plan to further instruction with SPC use in future visits.    Personal Factors and Comorbidities  Age;Comorbidity 3+;Time since onset of injury/illness/exacerbation;Past/Current Experience;Fitness    Comorbidities  RA, GERD, epilepsy, anemia, HLD, polymyalgia rheumatica, HTN, hx breast CA    Rehab Potential  Good    PT Treatment/Interventions  ADLs/Self Care Home Management;Cryotherapy;Electrical Stimulation;Iontophoresis 4mg /ml Dexamethasone;Moist Heat;Balance training;Therapeutic exercise;Therapeutic activities;Functional mobility training;Stair training;Gait training;Ultrasound;Neuromuscular re-education;Patient/family education;Manual techniques;Vasopneumatic Device;Taping;Energy conservation;Dry needling;Passive range of motion    PT Next Visit Plan  progress quad strengthening and balance exercises    Consulted and Agree with Plan of Care  Patient       Patient will benefit from skilled therapeutic intervention in order to improve the following deficits and impairments:  Abnormal gait, Hypomobility, Increased edema, Decreased activity tolerance, Decreased strength, Pain, Difficulty walking, Decreased balance, Decreased range of motion, Improper body  mechanics, Postural dysfunction, Impaired flexibility  Visit Diagnosis: 1. Acute pain of  right knee   2. Stiffness of right knee, not elsewhere classified   3. Muscle weakness (generalized)   4. Difficulty in walking, not elsewhere classified   5. Unsteadiness on feet        Problem List There are no active problems to display for this patient.   Bess Harvest, PTA 01/09/19 12:20 PM   [facility] [address]  [place], [place], [address] Phone: [phone]   Fax:  [phone]  Name: [name] MRN: [id number] Date of Birth: 1930/05/02

## 2019-01-11 ENCOUNTER — Other Ambulatory Visit: Payer: Self-pay | Admitting: Physician Assistant

## 2019-01-11 ENCOUNTER — Ambulatory Visit: Payer: Medicare Other

## 2019-01-11 ENCOUNTER — Other Ambulatory Visit: Payer: Self-pay

## 2019-01-11 DIAGNOSIS — M6281 Muscle weakness (generalized): Secondary | ICD-10-CM

## 2019-01-11 DIAGNOSIS — R1013 Epigastric pain: Secondary | ICD-10-CM

## 2019-01-11 DIAGNOSIS — K219 Gastro-esophageal reflux disease without esophagitis: Secondary | ICD-10-CM

## 2019-01-11 DIAGNOSIS — R2681 Unsteadiness on feet: Secondary | ICD-10-CM

## 2019-01-11 DIAGNOSIS — M25561 Pain in right knee: Secondary | ICD-10-CM

## 2019-01-11 DIAGNOSIS — M25661 Stiffness of right knee, not elsewhere classified: Secondary | ICD-10-CM

## 2019-01-11 DIAGNOSIS — R262 Difficulty in walking, not elsewhere classified: Secondary | ICD-10-CM

## 2019-01-11 NOTE — Therapy (Addendum)
[facility] [address]  [place], [place], [address] Phone: [phone]   Fax:  [phone]  Physical Therapy Treatment  Patient Details  Name: [name] MRN: [id number] Date of Birth: 09/30/1929 Referring Provider (PT): Sydnee Cabal, MD   Encounter Date: 01/11/2019  PT End of Session - 01/11/19 1320    Visit Number  5    Number of Visits  13    Date for PT Re-Evaluation  02/08/19    Authorization Type  [employer] Medicare    PT Start Time  1324    PT Stop Time  1405    PT Time Calculation (min)  50 min    Activity Tolerance  Patient tolerated treatment well    Behavior During Therapy  [facility] for tasks assessed/performed       No past medical history on file.  No past surgical history on file.  There were no vitals filed for this visit.  Subjective Assessment - 01/11/19 1321    Subjective  Pt. reporting abdominal discomfort this morning which has subsided and attributes this to her suspected IBS flare-up.    Pertinent History  RA, GERD, epilepsy, anemia, HLD, polymyalgia rheumatica, HTN, hx breast CA    Diagnostic tests  per patient- had xray which showed arthritis    Patient Stated Goals  get better at walking and keep that joint mobile    Currently in Pain?  Yes    Pain Score  5     Pain Location  Knee    Pain Orientation  Right;Lateral    Pain Descriptors / Indicators  Aching    Pain Type  Acute pain    Multiple Pain Sites  No                       [facility] Adult PT Treatment/Exercise - 01/11/19 0001      Knee/Hip Exercises: Stretches   Passive Hamstring Stretch  Right;30 seconds;1 rep    Passive Hamstring Stretch Limitations  supine strap    Hip Flexor Stretch  Right;30 seconds;2 reps    Hip Flexor Stretch Limitations  mod thomas position with strap     Piriformis Stretch  Right;2 reps;30 seconds    Piriformis Stretch Limitations  KTOS; Figure-4 with towel        Knee/Hip  Exercises: Aerobic   Nustep  Lvl 3, 6 min (UE/LE)      Knee/Hip Exercises: Standing   Terminal Knee Extension  Right;20 reps;Strengthening    Theraband Level (Terminal Knee Extension)  Level 3 (Green)    Terminal Knee Extension Limitations  Cues for positioning and TKE      Knee/Hip Exercises: Seated   Long Arc Quad  Right;15 reps;Strengthening    Long Arc Quad Weight  3 lbs.    Long Arc [employer] Limitations  Cues for [employer]      Knee/Hip Exercises: Supine   Bridges  --   x 12 reps    Knee Flexion  Right;10 reps;AAROM    Knee Flexion Limitations  peanut p-ball     Other Supine Knee/Hip Exercises  Hooklying alternating clam shell with red looped TB at knees x 10 reps       Vasopneumatic   Number Minutes Vasopneumatic   10 minutes    Vasopnuematic Location   Knee    Vasopneumatic Pressure  Low    Vasopneumatic Temperature   coldest temp.       Manual  Therapy   Manual Therapy  Passive ROM;Joint mobilization    Manual therapy comments  supine     Joint Mobilization  R patellar mobs all directions - min limited superior/inferior    Passive ROM  Manual R glute, piriformis, ITB, HS stretch with therapist              PT Education - 01/11/19 1601    Education Details  HEP update    Person(s) Educated  Patient    Methods  Explanation;Demonstration;Verbal cues;Handout    Comprehension  Verbalized understanding;Returned demonstration;Verbal cues required       PT Short Term Goals - 01/04/19 1207      PT SHORT TERM GOAL #1   Title  Patient to be independent with initial HEP.    Time  3    Period  Weeks    Status  Achieved    Target Date  01/18/19        PT Long Term Goals - 01/02/19 1119      PT LONG TERM GOAL #1   Title  Patient to be independent with advanced HEP.    Time  6    Period  Weeks    Status  On-going      PT LONG TERM GOAL #2   Title  Patient to demonstrate >=4+/5 strength in B LEs.    Time  6    Period  Weeks    Status  On-going      PT LONG TERM  GOAL #3   Title  Patient to demonstrate R knee AROM nonpainful and symmetrical to opposite LE.    Time  6    Period  Weeks    Status  On-going      PT LONG TERM GOAL #4   Title  Patient to demonstrate floor to stand transfer with min A.    Time  6    Period  Weeks    Status  On-going      PT LONG TERM GOAL #5   Title  Patient to score >19/24 on DGI to decrease risk of falls.    Time  6    Period  Weeks    Status  On-going            Plan - 01/11/19 1323    Clinical Impression Statement  Pt. seen to start session ambulating without AD reporting she forgot and left SPC at home.  Gait training with [place] deferred today with pt. verbalizing she will bring [facility] into next session.  Tolerated all proximal hip/quad strengthening activities well today.  Does require cueing with standing TB resisted TKE and stretching for proper pacing and duration of stretch at times however good carryover following cueing.  LE stretching targeting tightness in R lateral hip/LE musculature likely contributing to lateral knee pain today.  Ended visit with ice/compression to R knee as pt. noting some pain following standing therex.  Progressing well toward goals.    Personal Factors and Comorbidities  Age;Comorbidity 3+;Time since onset of injury/illness/exacerbation;Past/Current Experience;Fitness    Comorbidities  RA, GERD, epilepsy, anemia, HLD, polymyalgia rheumatica, HTN, hx breast CA    Rehab Potential  Good    PT Treatment/Interventions  ADLs/Self Care Home Management;Cryotherapy;Electrical Stimulation;Iontophoresis 4mg /ml Dexamethasone;Moist Heat;Balance training;Therapeutic exercise;Therapeutic activities;Functional mobility training;Stair training;Gait training;Ultrasound;Neuromuscular re-education;Patient/family education;Manual techniques;Vasopneumatic Device;Taping;Energy conservation;Dry needling;Passive range of motion    PT Next Visit Plan  progress quad strengthening and balance exercises     Consulted and Agree with Plan of Care  Patient       Patient will benefit from skilled therapeutic intervention in order to improve the following deficits and impairments:  Abnormal gait, Hypomobility, Increased edema, Decreased activity tolerance, Decreased strength, Pain, Difficulty walking, Decreased balance, Decreased range of motion, Improper body mechanics, Postural dysfunction, Impaired flexibility  Visit Diagnosis: 1. Acute pain of right knee   2. Stiffness of right knee, not elsewhere classified   3. Muscle weakness (generalized)   4. Difficulty in walking, not elsewhere classified   5. Unsteadiness on feet        Problem List There are no active problems to display for this patient.   Bess Harvest, PTA 01/11/19 4:01 PM     [facility] [address]  [place], [place], [address] Phone: [phone]   Fax:  [phone]  Name: KACHINA NIEDERER MRN: [id number] Date of Birth: 19-Apr-1930

## 2019-01-16 ENCOUNTER — Ambulatory Visit: Payer: Medicare Other

## 2019-01-16 ENCOUNTER — Other Ambulatory Visit: Payer: Self-pay

## 2019-01-16 DIAGNOSIS — R2681 Unsteadiness on feet: Secondary | ICD-10-CM

## 2019-01-16 DIAGNOSIS — R262 Difficulty in walking, not elsewhere classified: Secondary | ICD-10-CM

## 2019-01-16 DIAGNOSIS — M25561 Pain in right knee: Secondary | ICD-10-CM

## 2019-01-16 DIAGNOSIS — M6281 Muscle weakness (generalized): Secondary | ICD-10-CM

## 2019-01-16 DIAGNOSIS — M25661 Stiffness of right knee, not elsewhere classified: Secondary | ICD-10-CM

## 2019-01-16 NOTE — Therapy (Addendum)
[facility] [address]  [place], [place], [address] Phone: [phone]   Fax:  [phone]  Physical Therapy Treatment  Patient Details  Name: [name] MRN: [id number] Date of Birth: 05/30/1930 Referring Provider (PT): Sydnee Cabal, MD   Encounter Date: 01/16/2019  PT End of Session - 01/16/19 1114    Visit Number  6    Number of Visits  13    Date for PT [name]  02/08/19    Authorization Type  [employer] Medicare    PT Start Time  1104    PT Stop Time  1142    PT Time Calculation (min)  38 min    Activity Tolerance  Patient tolerated treatment well    Behavior During Therapy  [facility] for tasks assessed/performed       No past medical history on file.  No past surgical history on file.  There were no vitals filed for this visit.  Subjective Assessment - 01/16/19 1108    Subjective  Pt. reporting some R knee "stiffness" and pain mostly when standing and walking.    Pertinent History  RA, GERD, epilepsy, anemia, HLD, polymyalgia rheumatica, HTN, hx breast CA    Diagnostic tests  per patient- had xray which showed arthritis    Patient Stated Goals  get better at walking and keep that joint mobile    Currently in Pain?  Yes    Pain Score  2     Pain Location  Knee    Pain Orientation  Right;Lateral    Pain Descriptors / Indicators  Aching    Pain Type  Acute pain    Aggravating Factors   weight bearing, walking    Pain Relieving Factors  sitting    Multiple Pain Sites  No         [facility] PT Assessment - 01/16/19 0001      Assessment   Medical Diagnosis  Pain in R knee    Referring Provider (PT)  Sydnee Cabal, MD    Onset Date/Surgical Date  12/23/18    Next MD Visit  02/05/19    Prior Therapy  yes      AROM   AROM Assessment Site  Knee    Right/Left Knee  Right    Right Knee Extension  0   no quad lag present with SLR   Right Knee Flexion  124                   [facility]  Adult PT Treatment/Exercise - 01/16/19 0001      Neuro Nancy-ed    Neuro Nancy-ed Details   Alternating one nock over/righting focusing on SLS stability (lateral hip weakness evident) 2 x 7 cones; 1 light HH support from therapist       Knee/Hip Exercises: Stretches   Passive Hamstring Stretch  Right;30 seconds;1 rep    Passive Hamstring Stretch Limitations  supine strap      Knee/Hip Exercises: Aerobic   Nustep  Lvl 4, 6 min (UE/LE)      Knee/Hip Exercises: Standing   Heel Raises  Both;15 reps    Heel Raises Limitations  chair    Hip Abduction  Right;Left;10 reps;Knee straight    Abduction Limitations  yellow band at ankles    Hip Extension  Right;Left;10 reps;Knee straight;Stengthening    Extension Limitations  yellow band at ankles       Knee/Hip Exercises: Seated   Long [employer]  Right;15 reps;Strengthening    Long Arc Quad Weight  3 lbs.    Long Arc [employer] for [employer]    Other Seated Knee/Hip Exercises  R seated leg press (1 blue, 1 black band) x 15      Knee/Hip Exercises: Supine   Bridges  Both;15 reps    Straight Leg Raises  Right;15 reps;Strengthening             PT Education - 01/16/19 1247    Education Details  HEP update; yellow looped TB issued to pt. for standing hip abduction, ext    Person(s) Educated  Patient    Methods  Explanation;Demonstration;Verbal cues    Comprehension  Verbalized understanding;Returned demonstration;Verbal cues required       PT Short Term Goals - 01/04/19 1207      PT SHORT TERM GOAL #1   Title  Patient to be independent with initial HEP.    Time  3    Period  Weeks    Status  Achieved    Target Date  01/18/19        PT Long Term Goals - 01/16/19 1215      PT LONG TERM GOAL #1   Title  Patient to be independent with advanced HEP.    Time  6    Period  Weeks    Status  On-going      PT LONG TERM GOAL #2   Title  Patient to demonstrate >=4+/5 strength in B LEs.    Time  6    Period  Weeks    Status   On-going      PT LONG TERM GOAL #3   Title  Patient to demonstrate R knee AROM nonpainful and symmetrical to opposite LE.    Time  6    Period  Weeks    Status  Partially Met      PT LONG TERM GOAL #4   Title  Patient to demonstrate floor to stand transfer with min A.    Time  6    Period  Weeks    Status  On-going      PT LONG TERM GOAL #5   Title  Patient to score >19/24 on DGI to decrease risk of falls.    Time  6    Period  Weeks    Status  On-going            Plan - 01/16/19 1206    Clinical Impression Statement  [name] progressing well with therapy.  partially achieved LTG #2.  Now able to perform SLR without quad lag evident and R knee AROM 0-124 dg nearly symmetrical to L opposite knee.  Seems to be progressing toward LTG #3 demonstrating improved quad/hamstring strength however not formally measured with MMT today.  Some lateral glute weakness evident today thus progressed standing glute max/med strengthening and updated HEP accordingly.  [name] progressing well toward remaining LTGs.    Personal Factors and Comorbidities  Age;Comorbidity 3+;Time since onset of injury/illness/exacerbation;Past/Current Experience;Fitness    Comorbidities  RA, GERD, epilepsy, anemia, HLD, polymyalgia rheumatica, HTN, hx breast CA    Examination-Participation Restrictions  Church;Cleaning;Shop;Community Activity;Driving;Yard Work;Interpersonal Relationship;Laundry;Meal Prep    Rehab Potential  Good    PT Treatment/Interventions  ADLs/Self Care Home Management;Cryotherapy;Electrical Stimulation;Iontophoresis 74m/ml Dexamethasone;Moist Heat;Balance training;Therapeutic exercise;Therapeutic activities;Functional mobility training;Stair training;Gait training;Ultrasound;Neuromuscular Nancy-education;Patient/family education;Manual techniques;Vasopneumatic Device;Taping;Energy conservation;Dry needling;Passive range of motion    Consulted and Agree with Plan of Care  Patient  Patient will  benefit from skilled therapeutic intervention in order to improve the following deficits and impairments:  Abnormal gait, Hypomobility, Increased edema, Decreased activity tolerance, Decreased strength, Pain, Difficulty walking, Decreased balance, Decreased range of motion, Improper body mechanics, Postural dysfunction, Impaired flexibility  Visit Diagnosis: 1. Acute pain of right knee   2. Stiffness of right knee, not elsewhere classified   3. Muscle weakness (generalized)   4. Difficulty in walking, not elsewhere classified   5. Unsteadiness on feet        Problem List There are no active problems to display for this patient.   Bess Harvest, PTA 01/16/19 12:48 PM     [facility] [address]  [place], [place], [address] Phone: [phone]   Fax:  [phone]  Name: [name] MRN: [id number] Date of Birth: 1929/10/01

## 2019-01-18 ENCOUNTER — Encounter: Payer: Self-pay | Admitting: Physical Therapy

## 2019-01-18 ENCOUNTER — Other Ambulatory Visit: Payer: Self-pay

## 2019-01-18 ENCOUNTER — Ambulatory Visit: Payer: Medicare Other | Admitting: Physical Therapy

## 2019-01-18 DIAGNOSIS — M25661 Stiffness of right knee, not elsewhere classified: Secondary | ICD-10-CM

## 2019-01-18 DIAGNOSIS — R262 Difficulty in walking, not elsewhere classified: Secondary | ICD-10-CM

## 2019-01-18 DIAGNOSIS — M25561 Pain in right knee: Secondary | ICD-10-CM

## 2019-01-18 DIAGNOSIS — R2681 Unsteadiness on feet: Secondary | ICD-10-CM

## 2019-01-18 DIAGNOSIS — M6281 Muscle weakness (generalized): Secondary | ICD-10-CM

## 2019-01-18 NOTE — Therapy (Signed)
[facility] [address]  [place], [place], [address] Phone: [phone]   Fax:  [phone]  Physical Therapy Treatment  Patient Details  Name: [name] MRN: [id number] Date of Birth: 05/26/1930 Referring Provider (PT): Sydnee Cabal, MD   Encounter Date: 01/18/2019  PT End of Session - 01/18/19 1153    Visit Number  7    Number of Visits  13    Date for PT Re-Evaluation  02/08/19    Authorization Type  [employer]HC Medicare    PT Start Time  1100    PT Stop Time  1143    PT Time Calculation (min)  43 min    Equipment Utilized During Treatment  Gait belt    Activity Tolerance  Patient tolerated treatment well    Behavior During Therapy  [facility] for tasks assessed/performed       History reviewed. No pertinent past medical history.  History reviewed. No pertinent surgical history.  There were no vitals filed for this visit.  Subjective Assessment - 01/18/19 1102    Subjective  Feels like her knee is feeling better. Still having pain but believes this is d/t arthritis.    Pertinent History  RA, GERD, epilepsy, anemia, HLD, polymyalgia rheumatica, HTN, hx breast CA    Diagnostic tests  per patient- had xray which showed arthritis    Patient Stated Goals  get better at walking and keep that joint mobile    Currently in Pain?  Yes    Pain Score  4     Pain Location  Knee    Pain Orientation  Right    Pain Descriptors / Indicators  Aching    Pain Type  Acute pain    Multiple Pain Sites  Yes    Pain Score  4    Pain Location  Back    Pain Orientation  Right;Left;Lower    Pain Descriptors / Indicators  Aching    Pain Type  Chronic pain                       [facility] Adult PT Treatment/Exercise - 01/18/19 0001      Neuro Re-ed    Neuro Re-ed Details   lateral stepping over cone with hands hovering over counter x15      Knee/Hip Exercises: Stretches   Piriformis Stretch  Right;2 reps;30 seconds     Piriformis Stretch Limitations  supine figure 4    Gastroc Stretch  Right;Left;30 seconds;1 rep    Press photographer Limitations  at counter top    Other Knee/Hip Stretches  sitting R knee flexion stretch 5x10" to tolerance      Knee/Hip Exercises: Aerobic   Nustep  Lvl 4, 6 min (UE/LE)      Knee/Hip Exercises: Standing   Heel Raises  Both;15 reps    Heel Raises Limitations  hands hovering over sink    Other Standing Knee Exercises  R/L step up + down on foam x10 each with intermittent UE support on R, consistent support on L   cues for larger step and foot placement   Other Standing Knee Exercises  B toe raise with hands hovering over counter top x15      Knee/Hip Exercises: Supine   Short Arc Quad Sets  Strengthening;Right;1 set;15 reps    Short Arc Quad Sets Limitations  3# with foam under knees  PT Education - 01/18/19 1153    Education Details  update to HEP    Person(s) Educated  Patient    Methods  Explanation;Demonstration;Tactile cues;Verbal cues;Handout    Comprehension  Verbalized understanding;Returned demonstration       PT Short Term Goals - 01/04/19 1207      PT SHORT TERM GOAL #1   Title  Patient to be independent with initial HEP.    Time  3    Period  Weeks    Status  Achieved    Target Date  01/18/19        PT Long Term Goals - 01/16/19 1215      PT LONG TERM GOAL #1   Title  Patient to be independent with advanced HEP.    Time  6    Period  Weeks    Status  On-going      PT LONG TERM GOAL #2   Title  Patient to demonstrate >=4+/5 strength in B LEs.    Time  6    Period  Weeks    Status  On-going      PT LONG TERM GOAL #3   Title  Patient to demonstrate R knee AROM nonpainful and symmetrical to opposite LE.    Time  6    Period  Weeks    Status  Partially Met      PT LONG TERM GOAL #4   Title  Patient to demonstrate floor to stand transfer with min A.    Time  6    Period  Weeks    Status  On-going      PT LONG  TERM GOAL #5   Title  Patient to score >19/24 on DGI to decrease risk of falls.    Time  6    Period  Weeks    Status  On-going            Plan - 01/18/19 1153    Clinical Impression Statement  Patient arrived to session with report of improvement in R knee, but remarking that she is still struggling with balance. Thus, focused on dynamic balance training with firm and compliant surfaces. Patient with tendency to rush through movements and look straight ahead rather than looking where she places her feet, leading to imbalance. Better control demonstrated with decrease in speed after cues given. Patient with one episode of requiring mod A to recover balance with lateral stepping activity. Also demonstrated tendency to catch toes during stepping on foam, thus initiated gastroc stretch and updated HEP with this exercise. Updated HEP with other exercises that patient performed safely today. Patient reported understanding and with no complaints at end of session.    Personal Factors and Comorbidities  Age;Comorbidity 3+;Time since onset of injury/illness/exacerbation;Past/Current Experience;Fitness    Comorbidities  RA, GERD, epilepsy, anemia, HLD, polymyalgia rheumatica, HTN, hx breast CA    Examination-Participation Restrictions  Church;Cleaning;Shop;Community Activity;Driving;Yard Work;Interpersonal Relationship;Laundry;Meal Prep    Rehab Potential  Good    PT Treatment/Interventions  ADLs/Self Care Home Management;Cryotherapy;Electrical Stimulation;Iontophoresis 72m/ml Dexamethasone;Moist Heat;Balance training;Therapeutic exercise;Therapeutic activities;Functional mobility training;Stair training;Gait training;Ultrasound;Neuromuscular re-education;Patient/family education;Manual techniques;Vasopneumatic Device;Taping;Energy conservation;Dry needling;Passive range of motion    PT Next Visit Plan  progress quad strengthening and balance exercises    Consulted and Agree with Plan of Care  Patient        Patient will benefit from skilled therapeutic intervention in order to improve the following deficits and impairments:  Abnormal gait, Hypomobility, Increased edema, Decreased activity tolerance, Decreased strength,  Pain, Difficulty walking, Decreased balance, Decreased range of motion, Improper body mechanics, Postural dysfunction, Impaired flexibility  Visit Diagnosis: 1. Acute pain of right knee   2. Stiffness of right knee, not elsewhere classified   3. Muscle weakness (generalized)   4. Difficulty in walking, not elsewhere classified   5. Unsteadiness on feet        Problem List There are no active problems to display for this patient.    Janene Harvey, PT, DPT 01/18/19 12:01 PM   [facility] [address]  [place], [place], [address] Phone: [phone]   Fax:  [phone]  Name: [name] MRN: [id number] Date of Birth: [birth year]

## 2019-01-19 ENCOUNTER — Ambulatory Visit
Admission: RE | Admit: 2019-01-19 | Discharge: 2019-01-19 | Disposition: A | Discharge status: Home / Self Care | Payer: Medicare Other | Source: Ambulatory Visit | Attending: Physician Assistant | Admitting: Physician Assistant

## 2019-01-19 DIAGNOSIS — K219 Gastro-esophageal reflux disease without esophagitis: Secondary | ICD-10-CM

## 2019-01-19 DIAGNOSIS — R1013 Epigastric pain: Secondary | ICD-10-CM

## 2019-01-22 ENCOUNTER — Ambulatory Visit: Payer: Medicare Other | Admitting: Physical Therapy

## 2019-01-22 ENCOUNTER — Other Ambulatory Visit: Payer: Self-pay

## 2019-01-22 ENCOUNTER — Encounter: Payer: Self-pay | Admitting: Physical Therapy

## 2019-01-22 VITALS — BP 142/65 | HR 79

## 2019-01-22 DIAGNOSIS — M25661 Stiffness of right knee, not elsewhere classified: Secondary | ICD-10-CM

## 2019-01-22 DIAGNOSIS — M25561 Pain in right knee: Secondary | ICD-10-CM

## 2019-01-22 DIAGNOSIS — M6281 Muscle weakness (generalized): Secondary | ICD-10-CM

## 2019-01-22 DIAGNOSIS — R262 Difficulty in walking, not elsewhere classified: Secondary | ICD-10-CM

## 2019-01-22 DIAGNOSIS — R2681 Unsteadiness on feet: Secondary | ICD-10-CM

## 2019-01-22 NOTE — Therapy (Signed)
[facility] [address]  [place], [place], [address] Phone: [phone]   Fax:  [phone]  Physical Therapy Treatment  Patient Details  Name: [name] MRN: [id number] Date of Birth: 10-11-1929 Referring Provider (PT): Sydnee Cabal, MD   Encounter Date: 01/22/2019  PT End of Session - 01/22/19 1147    Visit Number  8    Number of Visits  13    Date for PT Re-Evaluation  02/08/19    Authorization Type  [employer] Medicare    PT Start Time  1102    PT Stop Time  1146    PT Time Calculation (min)  44 min    Equipment Utilized During Treatment  Gait belt    Activity Tolerance  Patient tolerated treatment well    Behavior During Therapy  [facility] for tasks assessed/performed       History reviewed. No pertinent past medical history.  History reviewed. No pertinent surgical history.  Vitals:   01/22/19 1103  BP: (!) 142/65  Pulse: 79  SpO2: 96%    Subjective Assessment - 01/22/19 1103    Subjective  Reports that she is feeling pretty good.    Pertinent History  RA, GERD, epilepsy, anemia, HLD, polymyalgia rheumatica, HTN, hx breast CA    Diagnostic tests  per patient- had xray which showed arthritis    Patient Stated Goals  get better at walking and keep that joint mobile    Currently in Pain?  Yes    Pain Score  3     Pain Location  Knee    Pain Orientation  Right    Pain Descriptors / Indicators  Aching    Pain Type  Acute pain                       [facility] Adult PT Treatment/Exercise - 01/22/19 0001      Neuro Re-ed    Neuro Re-ed Details   backwards walking, tandem walking with 1 UE on TM rail, ant/pos and M/L wt shifts on foam       Knee/Hip Exercises: Stretches   Gastroc Stretch  Right;Left;30 seconds;1 rep    Press photographer Limitations  at [employer] rail      Knee/Hip Exercises: Aerobic   Stationary Bike  L2 x 2mn      Knee/Hip Exercises: Standing   Other Standing Knee Exercises   sidestepping over pincushion with 2 UE, 1 UE, no UE support on TM rail x20   heavy cues for foot placement and slower speed   Other Standing Knee Exercises  heel/toe walking with 1 UE on TM rail x4 min    mild R knee pain     Knee/Hip Exercises: Supine   Bridges with Clamshell  Strengthening;Both;1 set;10 reps   red TB above knees   Other Supine Knee/Hip Exercises  hooklying alt marching with red TB around toes x20      Knee/Hip Exercises: Sidelying   Clams  clam shell x 15 with red TB at knees each LE             PT Education - 01/22/19 1147    Education Details  update to HEP- to be performed at counter top and with chair behind for safety    Person(s) Educated  Patient    Methods  Explanation;Demonstration;Tactile cues;Verbal cues;Handout    Comprehension  Verbalized understanding;Returned demonstration       PT Short Term  Goals - 01/04/19 1207      PT SHORT TERM GOAL #1   Title  Patient to be independent with initial HEP.    Time  3    Period  Weeks    Status  Achieved    Target Date  01/18/19        PT Long Term Goals - 01/16/19 1215      PT LONG TERM GOAL #1   Title  Patient to be independent with advanced HEP.    Time  6    Period  Weeks    Status  On-going      PT LONG TERM GOAL #2   Title  Patient to demonstrate >=4+/5 strength in B LEs.    Time  6    Period  Weeks    Status  On-going      PT LONG TERM GOAL #3   Title  Patient to demonstrate R knee AROM nonpainful and symmetrical to opposite LE.    Time  6    Period  Weeks    Status  Partially Met      PT LONG TERM GOAL #4   Title  Patient to demonstrate floor to stand transfer with min A.    Time  6    Period  Weeks    Status  On-going      PT LONG TERM GOAL #5   Title  Patient to score >19/24 on DGI to decrease risk of falls.    Time  6    Period  Weeks    Status  On-going            Plan - 01/22/19 1148    Clinical Impression Statement  Patient without new complaints.  Worked on progressive hip strengthening ther-ex on mat with patient demonstrating good form and tolerance. Proceeded to challenge dynamic balance in standing with intermittent UE support on treadmill rail. Patient continues to have challenges with lateral stepping over obstacle, but able to improve safety today with cues to decrease speed. Attempted heel/toe walking with patient demonstrating decreased B ankle strength and ROM. Patient already performing gastroc stretch at home which should help with this. Patient requiring intermittent sitting rest breaks d/t fatigue- reporting that she has been having stomach problems lately. Vitals WFL. Updated HEP with standing weight shifts at counter top to continue challenging balance, as patient with difficulty with posterior weight shift today. Patient reported understanding and with no complaints at end of session.    Personal Factors and Comorbidities  Age;Comorbidity 3+;Time since onset of injury/illness/exacerbation;Past/Current Experience;Fitness    Comorbidities  RA, GERD, epilepsy, anemia, HLD, polymyalgia rheumatica, HTN, hx breast CA    Examination-Participation Restrictions  Church;Cleaning;Shop;Community Activity;Driving;Yard Work;Interpersonal Relationship;Laundry;Meal Prep    Rehab Potential  Good    PT Treatment/Interventions  ADLs/Self Care Home Management;Cryotherapy;Electrical Stimulation;Iontophoresis '4mg'$ /ml Dexamethasone;Moist Heat;Balance training;Therapeutic exercise;Therapeutic activities;Functional mobility training;Stair training;Gait training;Ultrasound;Neuromuscular re-education;Patient/family education;Manual techniques;Vasopneumatic Device;Taping;Energy conservation;Dry needling;Passive range of motion    PT Next Visit Plan  progress quad strengthening and balance exercises    Consulted and Agree with Plan of Care  Patient       Patient will benefit from skilled therapeutic intervention in order to improve the following deficits and  impairments:  Abnormal gait, Hypomobility, Increased edema, Decreased activity tolerance, Decreased strength, Pain, Difficulty walking, Decreased balance, Decreased range of motion, Improper body mechanics, Postural dysfunction, Impaired flexibility  Visit Diagnosis: 1. Acute pain of right knee   2. Stiffness of right knee, not elsewhere classified  3. Muscle weakness (generalized)   4. Difficulty in walking, not elsewhere classified   5. Unsteadiness on feet        Problem List There are no active problems to display for this patient.    Janene Harvey, PT, DPT 01/22/19 11:53 AM   [facility] [place], [place], [address] Phone: [phone]   Fax:  [phone]  Name: [name] MRN: [id number] Date of Birth: 1930/01/17

## 2019-01-25 ENCOUNTER — Other Ambulatory Visit: Payer: Self-pay

## 2019-01-25 ENCOUNTER — Ambulatory Visit: Payer: Medicare Other

## 2019-01-25 DIAGNOSIS — R2681 Unsteadiness on feet: Secondary | ICD-10-CM

## 2019-01-25 DIAGNOSIS — M25561 Pain in right knee: Secondary | ICD-10-CM

## 2019-01-25 DIAGNOSIS — M6281 Muscle weakness (generalized): Secondary | ICD-10-CM

## 2019-01-25 DIAGNOSIS — M25661 Stiffness of right knee, not elsewhere classified: Secondary | ICD-10-CM

## 2019-01-25 DIAGNOSIS — R262 Difficulty in walking, not elsewhere classified: Secondary | ICD-10-CM

## 2019-01-25 NOTE — Therapy (Signed)
[facility] [address]  [place], [place], [address] Phone: [phone]   Fax:  [phone]  Physical Therapy Treatment  Patient Details  Name: [name] MRN: [id number] Date of Birth: August 08, 1929 Referring Provider (PT): Sydnee Cabal, MD   Encounter Date: 01/25/2019  PT End of Session - 01/25/19 1112    Visit Number  9    Number of Visits  13    Date for PT Re-Evaluation  02/08/19    Authorization Type  [employer]HC Medicare    PT Start Time  1055    PT Stop Time  1140    PT Time Calculation (min)  45 min    Equipment Utilized During Treatment  Gait belt    Activity Tolerance  Patient tolerated treatment well    Behavior During Therapy  [facility] for tasks assessed/performed       No past medical history on file.  No past surgical history on file.  There were no vitals filed for this visit.  Subjective Assessment - 01/25/19 1101    Subjective  Pt. getting back injection epidural on 02/06/19.    Pertinent History  RA, GERD, epilepsy, anemia, HLD, polymyalgia rheumatica, HTN, hx breast CA    Diagnostic tests  per patient- had xray which showed arthritis    Patient Stated Goals  get better at walking and keep that joint mobile    Currently in Pain?  Yes    Pain Score  3     Pain Location  Knee    Pain Orientation  Right    Pain Descriptors / Indicators  Aching    Pain Type  Acute pain                       [facility] Adult PT Treatment/Exercise - 01/25/19 0001      Transfers   Transfers  Floor to Transfer    Floor to Transfer  5: Supervision    Floor to Transfer Details (indicate cue type and reason)  floor to mat table transfer started with pt. in long-sitting position; performed with pt. min A UE assistance from mat table in order to stand up from floor/mats       Neuro Re-ed    Neuro Re-ed Details   Standing on double red mat; alternating toe-clears to 6" step with CGA from therapist x 10 rpes  each LE; Alternating step to 6" step with cross-over UE reach to stack cones on white bolster x 7 cones; CGA from therapist      Knee/Hip Exercises: Stretches   Gastroc Stretch  Right;Left;30 seconds;1 rep    Press photographer Limitations  counter      Knee/Hip Exercises: Aerobic   Nustep  Lvl 4, 6 min (UE/LE)      Knee/Hip Exercises: Standing   Heel Raises  Both;20 reps    Heel Raises Limitations  counter    Wall Squat  10 reps;3 seconds   + adduction ball squeeze    Wall Squat Limitations  shallow "mini" sits                PT Short Term Goals - 01/04/19 1207      PT SHORT TERM GOAL #1   Title  Patient to be independent with initial HEP.    Time  3    Period  Weeks    Status  Achieved    Target Date  01/18/19  PT Long Term Goals - 01/25/19 1302      PT LONG TERM GOAL #1   Title  Patient to be independent with advanced HEP.    Time  6    Period  Weeks    Status  On-going      PT LONG TERM GOAL #2   Title  Patient to demonstrate >=4+/5 strength in B LEs.    Time  6    Period  Weeks    Status  On-going      PT LONG TERM GOAL #3   Title  Patient to demonstrate R knee AROM nonpainful and symmetrical to opposite LE.    Time  6    Period  Weeks    Status  Partially Met      PT LONG TERM GOAL #4   Title  Patient to demonstrate floor to stand transfer with min A.    Time  6    Period  Weeks    Status  Achieved      PT LONG TERM GOAL #5   Title  Patient to score >19/24 on DGI to decrease risk of falls.    Time  6    Period  Weeks    Status  On-going            Plan - 01/25/19 1112    Clinical Impression Statement  Pt. making good progress with therapy.  Notes she has started back with walking program around neighborhood yesterday and wanting to get back to more consistent exercise at home.  Able to achieve LTG #4 today demonstrating floor to mat table transfer with min A UE assistance on edge of table without requiring therapist manual  assistance.  Pt. reporting she feels R knee ROM has improved significantly since starting therapy however still having some overall R knee dull soreness after long walks.  Ended visit pain free despite physically exerting session today.  Progressing well toward LTGs.    Personal Factors and Comorbidities  Age;Comorbidity 3+;Time since onset of injury/illness/exacerbation;Past/Current Experience;Fitness    Comorbidities  RA, GERD, epilepsy, anemia, HLD, polymyalgia rheumatica, HTN, hx breast CA    Rehab Potential  Good    PT Treatment/Interventions  ADLs/Self Care Home Management;Cryotherapy;Electrical Stimulation;Iontophoresis 21m/ml Dexamethasone;Moist Heat;Balance training;Therapeutic exercise;Therapeutic activities;Functional mobility training;Stair training;Gait training;Ultrasound;Neuromuscular re-education;Patient/family education;Manual techniques;Vasopneumatic Device;Taping;Energy conservation;Dry needling;Passive range of motion    PT Next Visit Plan  progress quad strengthening and balance exercises    Consulted and Agree with Plan of Care  Patient       Patient will benefit from skilled therapeutic intervention in order to improve the following deficits and impairments:  Abnormal gait, Hypomobility, Increased edema, Decreased activity tolerance, Decreased strength, Pain, Difficulty walking, Decreased balance, Decreased range of motion, Improper body mechanics, Postural dysfunction, Impaired flexibility  Visit Diagnosis: Acute pain of right knee  Stiffness of right knee, not elsewhere classified  Muscle weakness (generalized)  Difficulty in walking, not elsewhere classified  Unsteadiness on feet     Problem List There are no active problems to display for this patient.   MBess Harvest PTA 01/25/19 3:54 PM   [facility] [address] [place] [address]Phone: [phone]  Fax:  [phone] Name:  Nancy FREEBURGMRN: [id number] of Birth: 203-19-1931

## 2019-01-29 ENCOUNTER — Encounter: Payer: Self-pay | Admitting: Physical Therapy

## 2019-01-29 ENCOUNTER — Other Ambulatory Visit: Payer: Self-pay

## 2019-01-29 ENCOUNTER — Ambulatory Visit: Payer: Medicare Other | Admitting: Physical Therapy

## 2019-01-29 VITALS — BP 131/54 | HR 83

## 2019-01-29 DIAGNOSIS — M25661 Stiffness of right knee, not elsewhere classified: Secondary | ICD-10-CM

## 2019-01-29 DIAGNOSIS — M6281 Muscle weakness (generalized): Secondary | ICD-10-CM

## 2019-01-29 DIAGNOSIS — R262 Difficulty in walking, not elsewhere classified: Secondary | ICD-10-CM

## 2019-01-29 DIAGNOSIS — R2681 Unsteadiness on feet: Secondary | ICD-10-CM

## 2019-01-29 DIAGNOSIS — M25561 Pain in right knee: Secondary | ICD-10-CM

## 2019-01-29 NOTE — Therapy (Signed)
[facility] [address]  [place], [place], [address] Phone: [phone]   Fax:  [phone]  Physical Therapy Progress Note  Patient Details  Name: [name] MRN: [id number] Date of Birth: Apr 07, 1930 Referring Provider (PT): Sydnee Cabal, MD  Progress Note Reporting Period 12/28/18 to 01/29/19  See note below for Objective Data and Assessment of Progress/Goals.     Encounter Date: 01/29/2019  PT End of Session - 01/29/19 1150    Visit Number  10    Number of Visits  13    Date for PT Re-Evaluation  02/08/19    Authorization Type  [employer]HC Medicare    PT Start Time  1102    PT Stop Time  1147    PT Time Calculation (min)  45 min    Equipment Utilized During Treatment  Gait belt    Activity Tolerance  Patient tolerated treatment well    Behavior During Therapy  WFL for tasks assessed/performed       History reviewed. No pertinent past medical history.  History reviewed. No pertinent surgical history.  Vitals:   01/29/19 1106  BP: (!) 131/54  Pulse: 83  SpO2: 95%    Subjective Assessment - 01/29/19 1103    Subjective  Reports that she didn't get very much sleep last night. Feels like she had a good session on Thursday. Reports that she notices quite a bit of improvement in the R knee and notes that it is no longer stiff. Notes 75-80% improvement. Would like to continue working on balance.    Pertinent History  RA, GERD, epilepsy, anemia, HLD, polymyalgia rheumatica, HTN, hx breast CA    Diagnostic tests  per patient- had xray which showed arthritis    Patient Stated Goals  get better at walking and keep that joint mobile    Currently in Pain?  No/denies         [facility] PT Assessment - 01/29/19 0001      Observation/Other Assessments   Focus on Therapeutic Outcomes (FOTO)   Knee: 63 (37% limited, 32% predicted)      AROM   Right Knee Extension  0    Right Knee Flexion  126      PROM   Right Knee Extension  0    Right Knee Flexion  130      Strength   Right Hip Flexion  4/5    Right Hip ABduction  4+/5    Right Hip ADduction  4+/5    Left Hip Flexion  4+/5    Left Hip ABduction  4+/5    Left Hip ADduction  4+/5    Right Knee Flexion  5/5    Right Knee Extension  5/5    Left Knee Flexion  4/5    Left Knee Extension  5/5    Right Ankle Dorsiflexion  5/5    Right Ankle Plantar Flexion  4+/5    Left Ankle Dorsiflexion  4+/5    Left Ankle Plantar Flexion  4+/5      Dynamic Gait Index   Level Surface  Normal    Change in Gait Speed  Mild Impairment    Gait with Horizontal Head Turns  Normal    Gait with Vertical Head Turns  Mild Impairment    Gait and Pivot Turn  Mild Impairment    Step Over Obstacle  Moderate Impairment    Step Around Obstacles  Normal    Steps  Moderate Impairment    Total Score  17                   [facility] Adult PT Treatment/Exercise - 01/29/19 0001      Knee/Hip Exercises: Aerobic   Stationary Bike  L2 x 55mn      Manual Therapy   Manual Therapy  Taping    Kinesiotex  Create Space      Kinesiotix   Create Space  R knee chondramalacia pattern             PT Education - 01/29/19 1150    Education Details  discussion on objective progress with PT thus far; edu on KT tape benefits, wear time, removal, precautions    Person(s) Educated  Patient    Methods  Explanation;Demonstration;Tactile cues;Verbal cues;Handout    Comprehension  Verbalized understanding;Returned demonstration       PT Short Term Goals - 01/29/19 1116      PT SHORT TERM GOAL #1   Title  Patient to be independent with initial HEP.    Time  3    Period  Weeks    Status  Achieved    Target Date  01/18/19        PT Long Term Goals - 01/29/19 1116      PT LONG TERM GOAL #1   Title  Patient to be independent with advanced HEP.    Time  6    Period  Weeks    Status  Partially Met   met for current     PT LONG TERM GOAL #2   Title   Patient to demonstrate >=4+/5 strength in B LEs.    Time  6    Period  Weeks    Status  Partially Met   improvements in R hip abduction, B adduction, B knee flexion and extension, B PF, and R DF strength. Still limited in R hip flexion and L knee flexion.     PT LONG TERM GOAL #3   Title  Patient to demonstrate R knee AROM nonpainful and symmetrical to opposite LE.    Time  6    Period  Weeks    Status  Achieved   AROM 0-126 degrees, PROM 0-130 degrees without pain     PT LONG TERM GOAL #4   Title  Patient to demonstrate floor to stand transfer with min A.    Time  6    Period  Weeks    Status  Achieved      PT LONG TERM GOAL #5   Title  Patient to score >19/24 on DGI to decrease risk of falls.    Time  6    Period  Weeks    Status  On-going   17/24           Plan - 01/29/19 1159    Clinical Impression Statement  Patient arrived to session with report of 75-80% improvement in R knee since starting PT. Notes that her knee no longer feels stiff, but would like to continue working on her balance. Strength testing revealed improvements in R hip abduction, B adduction, B knee flexion and extension, B PF, and R DF strength. Still limited in R hip flexion and L knee flexion. Patient has now met ROM goal, with R knee AROM 0-126 degrees, PROM 0-130 degrees without pain. Patient has also previously met floor transfer goal. Still scored 17/24 on DGI this session, indicating increased risk of falls. Overall, patient  is demonstrating good progress with PT. Would like to continue addressing strength limitations and balance to decrease patient's fall risk. Patient still with intermittent catching sensation in R knee throughout testing today, thus ended session with KT tape to R knee for hopeful improvement in pain and stability. Patient reported understanding to all edu provided today, and with no complaints at end of session.    Comorbidities  RA, GERD, epilepsy, anemia, HLD, polymyalgia  rheumatica, HTN, hx breast CA    Rehab Potential  Good    PT Treatment/Interventions  ADLs/Self Care Home Management;Cryotherapy;Electrical Stimulation;Iontophoresis 52m/ml Dexamethasone;Moist Heat;Balance training;Therapeutic exercise;Therapeutic activities;Functional mobility training;Stair training;Gait training;Ultrasound;Neuromuscular re-education;Patient/family education;Manual techniques;Vasopneumatic Device;Taping;Energy conservation;Dry needling;Passive range of motion    PT Next Visit Plan  progress hip and flexion strength and balance exercises    Consulted and Agree with Plan of Care  Patient       Patient will benefit from skilled therapeutic intervention in order to improve the following deficits and impairments:  Abnormal gait, Hypomobility, Increased edema, Decreased activity tolerance, Decreased strength, Pain, Difficulty walking, Decreased balance, Decreased range of motion, Improper body mechanics, Postural dysfunction, Impaired flexibility  Visit Diagnosis: Acute pain of right knee  Stiffness of right knee, not elsewhere classified  Muscle weakness (generalized)  Difficulty in walking, not elsewhere classified  Unsteadiness on feet     Problem List There are no active problems to display for this patient.    YJanene Harvey PT, DPT 01/29/19 12:01 PM   [facility] [address] [address]Phone: [phone]  Fax:  [phone] Name: Nancy ESQUERMRN: [id number] of Birth: [birth year]

## 2019-02-01 ENCOUNTER — Encounter: Payer: Self-pay | Admitting: Physical Therapy

## 2019-02-01 ENCOUNTER — Emergency Department (HOSPITAL_BASED_OUTPATIENT_CLINIC_OR_DEPARTMENT_OTHER)
Admission: EM | Admit: 2019-02-01 | Discharge: 2019-02-01 | Disposition: A | Discharge status: Home / Self Care | Payer: Medicare Other | Source: Home / Self Care | Attending: Emergency Medicine | Admitting: Emergency Medicine

## 2019-02-01 ENCOUNTER — Other Ambulatory Visit: Payer: Self-pay

## 2019-02-01 ENCOUNTER — Encounter (HOSPITAL_BASED_OUTPATIENT_CLINIC_OR_DEPARTMENT_OTHER): Payer: Self-pay

## 2019-02-01 ENCOUNTER — Emergency Department (HOSPITAL_BASED_OUTPATIENT_CLINIC_OR_DEPARTMENT_OTHER): Payer: Medicare Other

## 2019-02-01 ENCOUNTER — Ambulatory Visit: Payer: Medicare Other | Admitting: Physical Therapy

## 2019-02-01 VITALS — BP 150/92 | HR 110

## 2019-02-01 DIAGNOSIS — R110 Nausea: Secondary | ICD-10-CM | POA: Insufficient documentation

## 2019-02-01 DIAGNOSIS — R2681 Unsteadiness on feet: Secondary | ICD-10-CM

## 2019-02-01 DIAGNOSIS — M25561 Pain in right knee: Secondary | ICD-10-CM

## 2019-02-01 DIAGNOSIS — R262 Difficulty in walking, not elsewhere classified: Secondary | ICD-10-CM

## 2019-02-01 DIAGNOSIS — M6281 Muscle weakness (generalized): Secondary | ICD-10-CM

## 2019-02-01 DIAGNOSIS — Z79899 Other long term (current) drug therapy: Secondary | ICD-10-CM | POA: Insufficient documentation

## 2019-02-01 DIAGNOSIS — I10 Essential (primary) hypertension: Secondary | ICD-10-CM | POA: Insufficient documentation

## 2019-02-01 DIAGNOSIS — K219 Gastro-esophageal reflux disease without esophagitis: Secondary | ICD-10-CM

## 2019-02-01 DIAGNOSIS — M25661 Stiffness of right knee, not elsewhere classified: Secondary | ICD-10-CM

## 2019-02-01 HISTORY — DX: Polymyalgia rheumatica: M35.3

## 2019-02-01 HISTORY — DX: Unspecified osteoarthritis, unspecified site: M19.90

## 2019-02-01 HISTORY — DX: Unspecified convulsions: R56.9

## 2019-02-01 HISTORY — DX: Gastro-esophageal reflux disease without esophagitis: K21.9

## 2019-02-01 HISTORY — DX: Essential (primary) hypertension: I10

## 2019-02-01 LAB — BASIC METABOLIC PANEL
Anion gap: 11 (ref 5–15)
BUN: 10 mg/dL (ref 8–23)
CO2: 24 mmol/L (ref 22–32)
Calcium: 8.9 mg/dL (ref 8.9–10.3)
Chloride: 102 mmol/L (ref 98–111)
Creatinine, Ser: 0.63 mg/dL (ref 0.44–1.00)
GFR calc Af Amer: >60 mL/min (ref >60)
GFR calc non Af Amer: >60 mL/min (ref >60)
Glucose, Bld: 105 mg/dL — ABNORMAL HIGH (ref 70–99)
Potassium: 4 mmol/L (ref 3.5–5.1)
Sodium: 137 mmol/L (ref 135–145)

## 2019-02-01 LAB — CBC WITH DIFFERENTIAL/PLATELET
Abs Immature Granulocytes: 0.04 10*3/uL (ref 0.00–0.07)
Basophils Absolute: 0 10*3/uL (ref 0.0–0.1)
Basophils Relative: 0 %
Eosinophils Absolute: 0.1 10*3/uL (ref 0.0–0.5)
Eosinophils Relative: 1 %
HCT: 40.1 % (ref 36.0–46.0)
Hemoglobin: 13 g/dL (ref 12.0–15.0)
Immature Granulocytes: 0 %
Lymphocytes Relative: 40 %
Lymphs Abs: 5.9 10*3/uL — ABNORMAL HIGH (ref 0.7–4.0)
MCH: 30.6 pg (ref 26.0–34.0)
MCHC: 32.4 g/dL (ref 30.0–36.0)
MCV: 94.4 fL (ref 80.0–100.0)
Monocytes Absolute: 0.8 10*3/uL (ref 0.1–1.0)
Monocytes Relative: 6 %
Neutro Abs: 7.9 10*3/uL — ABNORMAL HIGH (ref 1.7–7.7)
Neutrophils Relative %: 53 %
Platelets: 177 10*3/uL (ref 150–400)
RBC: 4.25 MIL/uL (ref 3.87–5.11)
RDW: 13.9 % (ref 11.5–15.5)
WBC: 14.6 10*3/uL — ABNORMAL HIGH (ref 4.0–10.5)
nRBC: 0 % (ref 0.0–0.2)

## 2019-02-01 LAB — TROPONIN I (HIGH SENSITIVITY)
Troponin I (High Sensitivity): 7 ng/L (ref <18)
Troponin I (High Sensitivity): 8 ng/L (ref <18)

## 2019-02-01 MED ORDER — ONDANSETRON 4 MG PO TBDP
4.0000 mg | ORAL_TABLET | Freq: Once | ORAL | Status: AC
  Administered 2019-02-01: 14:00:00 4 mg via ORAL
  Filled 2019-02-01: qty 1

## 2019-02-01 MED ORDER — ALUM & MAG HYDROXIDE-SIMETH 200-200-20 MG/5ML PO SUSP
30.0000 mL | Freq: Once | ORAL | Status: AC
  Administered 2019-02-01: 17:00:00 30 mL via ORAL
  Filled 2019-02-01: qty 30

## 2019-02-01 NOTE — Discharge Instructions (Addendum)
Your lab work is reassuring today. Continue treating your acid reflux as directed by your PCP/Gi doctor. Follow up with your primary care regarding  your visit today. Return to the ER for new or concerning symptoms.

## 2019-02-01 NOTE — ED Notes (Signed)
Pt amb to [facility]

## 2019-02-01 NOTE — ED Provider Notes (Signed)
[facility] EMERGENCY DEPARTMENT Provider Note   CSN: [id number] Arrival date & time: 02/01/19  1152     History   Chief Complaint Chief Complaint  Patient presents with  . Tachycardia    HPI [name] is a 85 y.o. female with past medical history of hypertension, GERD, polymyalgia rheumatica, presenting to the emergency department with complaint of abdominal upset and nausea since this morning.  She states she has a problem with GERD, and treats this with Pepcid and Protonix.  She states this morning she felt as though her stomach was bothering her little bit more than usual.  She also had some associated nausea.  She went to physical therapy for her right knee today and was working on balance and using a cane.  She states she was still feeling bad from this morning and her physical therapist noticed it.  When she sat down she took her vital signs and noticed that her heart rate was 119 and she was hypertensive.  She was also feeling more nauseous at the time.  She states she is not had any chest pain, shortness of breath, diaphoresis, or other associated symptoms.  Her symptoms do feel typical of her GERD, however were bothering her more today.  She feels better on arrival to the ED though is still feeling somewhat nauseous.  She had a reassuring myocardial perfusion scan done in 2017.  No known history of CAD.     The history is provided by the patient and medical records.    Past Medical History:  Diagnosis Date  . Arthritis   . GERD (gastroesophageal reflux disease)   . Hypertension   . Polymyalgia rheumatica ([place])   . Seizures ([place])     There are no active problems to display for this patient.   History reviewed. No pertinent surgical history.   OB History   No obstetric history on file.      Home Medications    Prior to Admission medications   Medication Sig Start Date End Date Taking? Authorizing Provider  acetaminophen (TYLENOL) 500 MG tablet  Take 2 tablets (1,000 mg total) by mouth every 6 (six) hours as needed. 07/09/18   Charlesetta Shanks, MD  amLODipine (NORVASC) 5 MG tablet Take by mouth. 07/03/16   [provider]  celecoxib (CELEBREX) 200 MG capsule Take by mouth. 05/11/16   [provider]  famotidine (PEPCID) 20 MG tablet Take 20 mg by mouth 2 (two) times daily.    [provider]  fluticasone (FLONASE) 50 MCG/ACT nasal spray Place into the nose. 02/03/18 02/03/19  [provider]  Multiple Vitamin (MULTIVITAMIN) tablet Take by mouth.    [provider]  ondansetron (ZOFRAN ODT) 4 MG disintegrating tablet Take 1 tablet (4 mg total) by mouth every 4 (four) hours as needed for nausea or vomiting. Patient not taking: Reported on 12/28/2018 07/09/18   Charlesetta Shanks, MD  pantoprazole (PROTONIX) 40 MG tablet Take by mouth. 01/14/14   [provider]  PHENobarbital (LUMINAL) 97.2 MG tablet Take by mouth. 05/17/16   [provider]  predniSONE (DELTASONE) 5 MG tablet Take by mouth. 02/22/18   [provider]    Family History No family history on file.  Social History Social History   Tobacco Use  . Smoking status: Never Smoker  . Smokeless tobacco: Never Used  Substance Use Topics  . Alcohol use: Never    Frequency: Never  . Drug use: Never  Allergies   Penicillins   Review of Systems Review of Systems  Respiratory: Negative for shortness of breath.   Cardiovascular: Negative for chest pain.  Gastrointestinal: Positive for abdominal pain and nausea. Negative for vomiting.  All other systems reviewed and are negative.    Physical Exam Updated Vital Signs BP (!) 140/58   Pulse 66   Temp 99 F (37.2 C) (Oral)   Resp 17   Ht 5\' 2"  (1.575 m)   Wt 69.4 kg   LMP  (LMP Unknown)   SpO2 95%   BMI 27.98 kg/m   Physical Exam Vitals signs and nursing note reviewed.  Constitutional:      General: She is not in acute distress.    Appearance: She  is well-developed. She is not ill-appearing.  HENT:     Head: Normocephalic and atraumatic.  Eyes:     Conjunctiva/sclera: Conjunctivae normal.  Neck:     Musculoskeletal: Normal range of motion and neck supple.  Cardiovascular:     Rate and Rhythm: Normal rate and regular rhythm.  Pulmonary:     Effort: Pulmonary effort is normal. No respiratory distress.     Breath sounds: Normal breath sounds.  Abdominal:     General: Bowel sounds are normal.     Palpations: Abdomen is soft.     Tenderness: There is no abdominal tenderness. There is no guarding or rebound.  Musculoskeletal:     Right lower leg: No edema.     Left lower leg: No edema.  Skin:    General: Skin is warm.  Neurological:     Mental Status: She is alert.  Psychiatric:        Behavior: Behavior normal.      ED Treatments / Results  Labs (all labs ordered are listed, but only abnormal results are displayed) Labs Reviewed  BASIC METABOLIC PANEL - Abnormal; Notable for the following components:      Result Value   Glucose, Bld 105 (*)    All other components within normal limits  CBC WITH DIFFERENTIAL/PLATELET - Abnormal; Notable for the following components:   WBC 14.6 (*)    Neutro Abs 7.9 (*)    Lymphs Abs 5.9 (*)    All other components within normal limits  TROPONIN I (HIGH SENSITIVITY)  TROPONIN I (HIGH SENSITIVITY)    EKG EKG Interpretation  Date/Time:  Thursday February 01 2019 12:27:26 EDT Ventricular Rate:  76 PR Interval:  144 QRS Duration: 66 QT Interval:  366 QTC Calculation: 411 R Axis:   0 Text Interpretation:  Normal sinus rhythm Cannot rule out Anterior infarct , age undetermined Abnormal ECG Confirmed by Veryl Speak [id number]) on 02/01/2019 2:46:35 PM   Radiology Dg Chest 2 View  Result Date: 02/01/2019 CLINICAL DATA:  Epigastric pain, tachycardia, nausea EXAM: CHEST - 2 VIEW COMPARISON:  02/02/2018 FINDINGS: The heart size and mediastinal contours are within normal limits. Both  lungs are clear. Disc degenerative disease of the thoracic spine. IMPRESSION: No acute abnormality of the lungs. Electronically Signed   By: Eddie Candle M.D.   On: 02/01/2019 14:18    Procedures Procedures (including critical care time)  Medications Ordered in ED Medications  ondansetron (ZOFRAN-ODT) disintegrating tablet 4 mg (4 mg Oral Given 02/01/19 1348)  alum & mag hydroxide-simeth (MAALOX/MYLANTA) 200-200-20 MG/5ML suspension 30 mL (30 mLs Oral Given 02/01/19 1658)     Initial Impression / Assessment and Plan / ED Course  I have reviewed the triage vital signs and the nursing  notes.  Pertinent labs & imaging results that were available during my care of the patient were reviewed by me and considered in my medical decision making (see chart for details).        Pt w hx of GERD, presenting with worsening symptoms today. She states she woke up with abdominal upset and nausea consistent with her typical GERD sx, though it was bothering her more throughout the day.  She was at physical therapy today for her knee, and began feeling more nauseated when she was working on balance and walking with a cane.  She is on sat down and they took her vital signs and noted a tachycardia of 119 with some hypertension.  She denies any associated chest pain, shortness of breath, diaphoresis.  On arrival to the ED she is feeling somewhat better though is still having some nausea.  She had recent myocardial perfusion scan done in 2017 which was normal.  Exam today is reassuring.  Vital signs mild hypertension, otherwise unremarkable.  EKG reviewed by Dr. Stark Jock, without concern for acute ischemia.  Troponin is within normal limits x2.  Chest x-ray is negative.  Patient discussed with and evaluated by Dr. Stark Jock.  Shared decision making was done with patient.  She feels as though her symptoms today of nausea and abdominal discomfort are very consistent with her ongoing troubles with GERD.  She is feeling better after  Zofran.  Lower suspicion for cardiac etiology of symptoms given reassuring work-up.  At this time patient is agreeable to discharge with close primary care follow-up.  She is instructed return to the emergency department for any new or worsening symptoms.  Discussed results, findings, treatment and follow up. Patient advised of return precautions. Patient verbalized understanding and agreed with plan.  Final Clinical Impressions(s) / ED Diagnoses   Final diagnoses:  Nausea  Gastroesophageal reflux disease, esophagitis presence not specified    ED Discharge Orders    None       , [place] N, PA-C 02/01/19 1740    Veryl Speak, MD 02/02/19 [id number]

## 2019-02-01 NOTE — Therapy (Signed)
[facility] [address]  [place], [place], [address] Phone: [phone]   Fax:  [phone]  Physical Therapy Treatment  Patient Details  Name: [name] MRN: [id number] Date of Birth: [birth year] Referring Provider (PT): Sydnee Cabal, MD   Encounter Date: 02/01/2019  PT End of Session - 02/01/19 1139    Visit Number  11    Number of Visits  13    Date for PT Re-Evaluation  02/08/19    Authorization Type  [employer] Medicare    PT Start Time  1101    PT Stop Time  1136    PT Time Calculation (min)  35 min    Equipment Utilized During Treatment  Gait belt    Activity Tolerance  Patient tolerated treatment well;Other (comment);Patient limited by fatigue   limited by nausea   Behavior During Therapy  [facility] for tasks assessed/performed       History reviewed. No pertinent past medical history.  History reviewed. No pertinent surgical history.  Vitals:   02/01/19 1102 02/01/19 1126  BP:  (!) 150/92  Pulse: (!) 120 (!) 110  SpO2: 91% 95%    Subjective Assessment - 02/01/19 1102    Subjective  Reports that she has been taking meds for her stomach that have been making her nervous. Planning to F/U with her MD about this.    Pertinent History  RA, GERD, epilepsy, anemia, HLD, polymyalgia rheumatica, HTN, hx breast CA    Diagnostic tests  per patient- had xray which showed arthritis    Patient Stated Goals  get better at walking and keep that joint mobile    Currently in Pain?  Yes    Pain Score  4     Pain Location  Knee    Pain Orientation  Right;Left    Pain Descriptors / Indicators  Aching    Pain Type  Acute pain                       [facility] Adult PT Treatment/Exercise - 02/01/19 0001      Ambulation/Gait   Ambulation Distance (Feet)  270 Feet    Assistive device  Straight cane    Gait Pattern  Step-to pattern;Step-through pattern;Decreased stance time - right;Decreased step length -  left;Decreased hip/knee flexion - right;Decreased weight shift to right;Antalgic    Gait Comments  gait training with SPC sequencing cues      Knee/Hip Exercises: Aerobic   Stationary Bike  L1 x 11mn   could not tolerate L2 d/t fatigue     Knee/Hip Exercises: Standing   Other Standing Knee Exercises  sidestepping over pincushion with hands hovering over counter top x10   cues to slow down and increase step length            PT Education - 02/01/19 1138    Education Details  consolidation of HEP, advised patient to F/U with MD d/t increasing SOB and HR with activity    Person(s) Educated  Patient    Methods  Explanation;Demonstration;Tactile cues;Verbal cues    Comprehension  Verbalized understanding;Returned demonstration       PT Short Term Goals - 01/29/19 1116      PT SHORT TERM GOAL #1   Title  Patient to be independent with initial HEP.    Time  3    Period  Weeks    Status  Achieved    Target Date  01/18/19  PT Long Term Goals - 01/29/19 1116      PT LONG TERM GOAL #1   Title  Patient to be independent with advanced HEP.    Time  6    Period  Weeks    Status  Partially Met   met for current     PT LONG TERM GOAL #2   Title  Patient to demonstrate >=4+/5 strength in B LEs.    Time  6    Period  Weeks    Status  Partially Met   improvements in R hip abduction, B adduction, B knee flexion and extension, B PF, and R DF strength. Still limited in R hip flexion and L knee flexion.     PT LONG TERM GOAL #3   Title  Patient to demonstrate R knee AROM nonpainful and symmetrical to opposite LE.    Time  6    Period  Weeks    Status  Achieved   AROM 0-126 degrees, PROM 0-130 degrees without pain     PT LONG TERM GOAL #4   Title  Patient to demonstrate floor to stand transfer with min A.    Time  6    Period  Weeks    Status  Achieved      PT LONG TERM GOAL #5   Title  Patient to score >19/24 on DGI to decrease risk of falls.    Time  6    Period   Weeks    Status  On-going   17/24           Plan - 02/01/19 1139    Clinical Impression Statement  Patient arrived to session with report of increase in nervousness since starting a new medication for her stomach. Limited by fatigue and SOB with warm up. Adjusted patient's HuriCane to proper height for improved comfort with ambulation. Worked on gait training with the cane with cues for sequencing. Patient unsteady when attempting to slow down he gait pattern, requiring CGA/min A intermittent to regain balance. Attempted to work on Dietitian exercises at counter top, however, patient visibly with increased respiratory rate with relatively low level of activity. Took vitals, with patient presenting as tachycardic and with elevated BP. Patient also reporting nausea. Allowed patient a sitting rest break and educated patient on slow diaphragmatic breathing. HR improved after break. Advised patient to F/U with her MD about recent medication change d/t recent change in symptoms. Patient reported understanding and with no further complaints at end of session.    Comorbidities  RA, GERD, epilepsy, anemia, HLD, polymyalgia rheumatica, HTN, hx breast CA    Rehab Potential  Good    PT Treatment/Interventions  ADLs/Self Care Home Management;Cryotherapy;Electrical Stimulation;Iontophoresis 60m/ml Dexamethasone;Moist Heat;Balance training;Therapeutic exercise;Therapeutic activities;Functional mobility training;Stair training;Gait training;Ultrasound;Neuromuscular re-education;Patient/family education;Manual techniques;Vasopneumatic Device;Taping;Energy conservation;Dry needling;Passive range of motion    PT Next Visit Plan  progress hip and flexion strength and balance exercises    Consulted and Agree with Plan of Care  Patient       Patient will benefit from skilled therapeutic intervention in order to improve the following deficits and impairments:  Abnormal gait, Hypomobility, Increased edema,  Decreased activity tolerance, Decreased strength, Pain, Difficulty walking, Decreased balance, Decreased range of motion, Improper body mechanics, Postural dysfunction, Impaired flexibility  Visit Diagnosis: Acute pain of right knee  Stiffness of right knee, not elsewhere classified  Muscle weakness (generalized)  Difficulty in walking, not elsewhere classified  Unsteadiness on feet     Problem List  There are no active problems to display for this patient.    Janene Harvey, PT, DPT 02/01/19 11:45 AM   [facility] [place], [place], [address] Phone: [phone]   Fax:  [phone]  Name: [name] MRN: [id number] Date of Birth: 1929/09/06

## 2019-02-01 NOTE — ED Triage Notes (Signed)
Pt states she was at PT for her knee when it was stopped due to her "heart rate and blood pressure went up and my oxygen level went down"-pt c/o nausea-denies pain-NAD-steady slow gait to triage

## 2019-02-05 ENCOUNTER — Other Ambulatory Visit: Payer: Self-pay

## 2019-02-05 ENCOUNTER — Encounter: Payer: Self-pay | Admitting: Physical Therapy

## 2019-02-05 ENCOUNTER — Ambulatory Visit: Payer: Medicare Other | Admitting: Physical Therapy

## 2019-02-05 VITALS — BP 137/78 | HR 132

## 2019-02-05 DIAGNOSIS — M25661 Stiffness of right knee, not elsewhere classified: Secondary | ICD-10-CM

## 2019-02-05 DIAGNOSIS — R2681 Unsteadiness on feet: Secondary | ICD-10-CM

## 2019-02-05 DIAGNOSIS — R262 Difficulty in walking, not elsewhere classified: Secondary | ICD-10-CM

## 2019-02-05 DIAGNOSIS — M6281 Muscle weakness (generalized): Secondary | ICD-10-CM

## 2019-02-05 DIAGNOSIS — M25561 Pain in right knee: Secondary | ICD-10-CM

## 2019-02-05 NOTE — Therapy (Signed)
[facility] [address]  [place], [place], [address] Phone: [phone]   Fax:  [phone]  Physical Therapy Treatment  Patient Details  Name: [name] MRN: [id number] Date of Birth: 05/10/1930 Referring Provider (PT): Sydnee Cabal, MD   Encounter Date: 02/05/2019  PT End of Session - 02/05/19 1146    Visit Number  12    Number of Visits  13    Date for PT Re-Evaluation  02/08/19    Authorization Type  [employer] Medicare    PT Start Time  1103    PT Stop Time  1142    PT Time Calculation (min)  39 min    Equipment Utilized During Treatment  Gait belt    Activity Tolerance  Patient tolerated treatment well;Patient limited by fatigue    Behavior During Therapy  [facility] for tasks assessed/performed       Past Medical History:  Diagnosis Date  . Arthritis   . GERD (gastroesophageal reflux disease)   . Hypertension   . Polymyalgia rheumatica ([place])   . Seizures ([place])     History reviewed. No pertinent surgical history.  Vitals:   02/05/19 1107 02/05/19 1121 02/05/19 1131  BP: 137/78    Pulse: 77 (!) 134 (!) 132  SpO2: 98% 93% 93%    Subjective Assessment - 02/05/19 1103    Subjective  Reports that she went to the ED after last session- was so sick that she could hardly get to her car. They advised her that she had acid reflux and tachycardia. Had another episode of her heart racing again this weekend- had stomach pain and was a bit anxious.  Has a stress test scheduled Thursday.MD took her off of her new medication that she believes was causing increased HR.    Pertinent History  RA, GERD, epilepsy, anemia, HLD, polymyalgia rheumatica, HTN, hx breast CA    Diagnostic tests  per patient- had xray which showed arthritis    Patient Stated Goals  get better at walking and keep that joint mobile    Currently in Pain?  No/denies                       [facility] Adult PT Treatment/Exercise -  02/05/19 0001      Neuro Re-ed    Neuro Re-ed Details   midline crossing midline reach to cone on bolster with airex x10 each side with CGA   cues to slow down and correct foot placement     Knee/Hip Exercises: Aerobic   Stationary Bike  L1 x 66mn      Knee/Hip Exercises: Standing   Hip Flexion  Stengthening;Right;Left;1 set;20 reps;Knee bent    Hip Flexion Limitations  resisted marching with red TB at toes    Other Standing Knee Exercises  sidestepping 2x along counter top             PT Education - 02/05/19 1145    Education Details  advised patient to discontinue HEP for the time being d/t elevation of HR    Person(s) Educated  Patient    Methods  Explanation    Comprehension  Verbalized understanding       PT Short Term Goals - 01/29/19 1116      PT SHORT TERM GOAL #1   Title  Patient to be independent with initial HEP.    Time  3    Period  Weeks    Status  Achieved    Target Date  01/18/19        PT Long Term Goals - 01/29/19 1116      PT LONG TERM GOAL #1   Title  Patient to be independent with advanced HEP.    Time  6    Period  Weeks    Status  Partially Met   met for current     PT LONG TERM GOAL #2   Title  Patient to demonstrate >=4+/5 strength in B LEs.    Time  6    Period  Weeks    Status  Partially Met   improvements in R hip abduction, B adduction, B knee flexion and extension, B PF, and R DF strength. Still limited in R hip flexion and L knee flexion.     PT LONG TERM GOAL #3   Title  Patient to demonstrate R knee AROM nonpainful and symmetrical to opposite LE.    Time  6    Period  Weeks    Status  Achieved   AROM 0-126 degrees, PROM 0-130 degrees without pain     PT LONG TERM GOAL #4   Title  Patient to demonstrate floor to stand transfer with min A.    Time  6    Period  Weeks    Status  Achieved      PT LONG TERM GOAL #5   Title  Patient to score >19/24 on DGI to decrease risk of falls.    Time  6    Period  Weeks     Status  On-going   17/24           Plan - 02/05/19 1146    Clinical Impression Statement  Patient arrived to session with report of having and ED visit after last session d/t feeling "so sick that I couldn't get to my car." Was advised that she was tachycardic and had acid reflux. Had another episode of "racing heart" this weekend without known cause. Is scheduled for a stress test on Thursday. Vitals WFL at beginning of session and patient asymptomatic. Worked on standing LE strengthening exercises- patient became fatigued quickly and with increased respiratory rate. HR 134bpm, thus allowed for sitting rest break to allow HR to drop to below 100bpm. Patient still with tendency to rush during balance exercises, requiring cues to slow down and correct foot position and form. Monitored HR throughout session, with sitting breaks to allowed HR to normalize. Patient reported no complaints at end of session and declined assistance out to her car.    Comorbidities  RA, GERD, epilepsy, anemia, HLD, polymyalgia rheumatica, HTN, hx breast CA    Rehab Potential  Good    PT Treatment/Interventions  ADLs/Self Care Home Management;Cryotherapy;Electrical Stimulation;Iontophoresis 67m/ml Dexamethasone;Moist Heat;Balance training;Therapeutic exercise;Therapeutic activities;Functional mobility training;Stair training;Gait training;Ultrasound;Neuromuscular re-education;Patient/family education;Manual techniques;Vasopneumatic Device;Taping;Energy conservation;Dry needling;Passive range of motion    PT Next Visit Plan  progress hip and flexion strength and balance exercises    Consulted and Agree with Plan of Care  Patient       Patient will benefit from skilled therapeutic intervention in order to improve the following deficits and impairments:  Abnormal gait, Hypomobility, Increased edema, Decreased activity tolerance, Decreased strength, Pain, Difficulty walking, Decreased balance, Decreased range of motion,  Improper body mechanics, Postural dysfunction, Impaired flexibility  Visit Diagnosis: Acute pain of right knee  Stiffness of right knee, not elsewhere classified  Muscle weakness (generalized)  Difficulty in walking, not elsewhere classified  Unsteadiness  on feet     Problem List There are no active problems to display for this patient.    Janene Harvey, PT, DPT 02/05/19 11:49 AM   [facility] [address]  [place], [place], [address] Phone: [phone]   Fax:  [phone]  Name: [name] MRN: [id number] Date of Birth: Dec 19, 1929

## 2019-02-07 ENCOUNTER — Encounter: Payer: Self-pay | Admitting: Physical Therapy

## 2019-02-07 ENCOUNTER — Other Ambulatory Visit: Payer: Self-pay

## 2019-02-07 ENCOUNTER — Ambulatory Visit: Payer: Medicare Other | Attending: Specialist | Admitting: Physical Therapy

## 2019-02-07 VITALS — BP 130/90 | HR 121

## 2019-02-07 DIAGNOSIS — R262 Difficulty in walking, not elsewhere classified: Secondary | ICD-10-CM | POA: Diagnosis present

## 2019-02-07 DIAGNOSIS — R2681 Unsteadiness on feet: Secondary | ICD-10-CM | POA: Insufficient documentation

## 2019-02-07 DIAGNOSIS — M6281 Muscle weakness (generalized): Secondary | ICD-10-CM | POA: Insufficient documentation

## 2019-02-07 DIAGNOSIS — M25661 Stiffness of right knee, not elsewhere classified: Secondary | ICD-10-CM | POA: Diagnosis present

## 2019-02-07 DIAGNOSIS — M25561 Pain in right knee: Secondary | ICD-10-CM | POA: Diagnosis present

## 2019-02-07 NOTE — Therapy (Signed)
[facility] [address]  [place], [place], [address] Phone: [phone]   Fax:  [phone]  Physical Therapy Treatment  Patient Details  Name: [name] MRN: [id number] Date of Birth: 09-08-1929 Referring Provider (PT): Sydnee Cabal, MD   Encounter Date: 02/07/2019  PT End of Session - 02/07/19 1145    Visit Number  13    Number of Visits  25    Date for PT Re-Evaluation  03/21/19    Authorization Type  [employer] Medicare    PT Start Time  1101    PT Stop Time  1139    PT Time Calculation (min)  38 min    Equipment Utilized During Treatment  Gait belt    Activity Tolerance  Patient tolerated treatment well;Patient limited by fatigue    Behavior During Therapy  [facility] for tasks assessed/performed       Past Medical History:  Diagnosis Date  . Arthritis   . GERD (gastroesophageal reflux disease)   . Hypertension   . Polymyalgia rheumatica ([place])   . Seizures ([place])     History reviewed. No pertinent surgical history.  Vitals:   02/07/19 1102 02/07/19 1113 02/07/19 1130  BP: 130/90    Pulse: 72 (!) 107 (!) 121  SpO2: 95% 91% 92%    Subjective Assessment - 02/07/19 1106    Subjective  Reports that she is still dealing with a racing heart when she gets up and moves. Feels fine once she rests. Reports 75% improvement since starting PT. Has noticed good improvement in R knee ROM. Still struggling with balance, Had a lumbar epidural yesterday and is having less pain.    Pertinent History  RA, GERD, epilepsy, anemia, HLD, polymyalgia rheumatica, HTN, hx breast CA    Diagnostic tests  per patient- had xray which showed arthritis    Patient Stated Goals  get better at walking and keep that joint mobile    Currently in Pain?  No/denies         [facility] PT Assessment - 02/07/19 0001      Assessment   Medical Diagnosis  Pain in R knee    Referring Provider (PT)  Sydnee Cabal, MD    Onset Date/Surgical Date   12/23/18      Strength   Right Hip Flexion  4/5    Right Hip ABduction  4+/5    Right Hip ADduction  4+/5    Left Hip Flexion  4+/5    Left Hip ABduction  4+/5    Left Hip ADduction  4+/5    Right Knee Flexion  5/5    Right Knee Extension  4+/5    Left Knee Flexion  4+/5    Left Knee Extension  5/5    Right Ankle Dorsiflexion  4+/5    Right Ankle Plantar Flexion  4+/5    Left Ankle Dorsiflexion  4+/5    Left Ankle Plantar Flexion  4+/5      Dynamic Gait Index   Level Surface  Normal    Change in Gait Speed  Normal    Gait with Horizontal Head Turns  Normal    Gait with Vertical Head Turns  Mild Impairment    Gait and Pivot Turn  Severe Impairment    Step Over Obstacle  Moderate Impairment    Step Around Obstacles  Moderate Impairment    Steps  Moderate Impairment    Total Score  14  [facility] PT Treatment/Exercise - 02/07/19 0001      Knee/Hip Exercises: Aerobic   Nustep  Lvl 4, 6 min (LEs only)             PT Education - 02/07/19 1144    Education Details  update to HEP; discussion on objective progress with PT, discussion on benefits of fitness program once cleared by MD    Person(s) Educated  Patient    Methods  Explanation;Demonstration;Tactile cues;Verbal cues;Handout    Comprehension  Verbalized understanding;Returned demonstration       PT Short Term Goals - 02/07/19 1111      PT SHORT TERM GOAL #1   Title  Patient to be independent with initial HEP.    Time  3    Period  Weeks    Status  Achieved    Target Date  01/18/19        PT Long Term Goals - 02/07/19 1111      PT LONG TERM GOAL #1   Title  Patient to be independent with advanced HEP.    Time  6    Period  Weeks    Status  Partially Met   met for current   Target Date  03/21/19      PT LONG TERM GOAL #2   Title  Patient to demonstrate >=4+/5 strength in B LEs.    Time  6    Period  Weeks    Status  Partially Met   only limited in R hip flexion    Target Date  03/21/19      PT LONG TERM GOAL #3   Title  Patient to demonstrate R knee AROM nonpainful and symmetrical to opposite LE.    Time  6    Period  Weeks    Status  Achieved   AROM 0-126 degrees, PROM 0-130 degrees without pain     PT LONG TERM GOAL #4   Title  Patient to demonstrate floor to stand transfer with min A.    Time  6    Period  Weeks    Status  Achieved      PT LONG TERM GOAL #5   Title  Patient to score >19/24 on DGI to decrease risk of falls.    Time  6    Period  Weeks    Status  On-going   14/24   Target Date  03/21/19      Additional Long Term Goals   Additional Long Term Goals  Yes      PT LONG TERM GOAL #6   Title  Patient to demonstrate reciprocal stair climbing up/down 13 steps with 1 handrail for support with good stability.    Time  6    Period  Weeks    Status  New    Target Date  03/21/19      PT LONG TERM GOAL #7   Title  Patient to report tolerance of walking 1 mile without fatigue or knee pain limiting.    Time  6    Period  Weeks    Status  New    Target Date  03/21/19            Plan - 02/07/19 1154    Clinical Impression Statement  Patient arrived to session with report of 75% improvement in R knee since starting PT. Still having elevated HR with activity. Notes good improvement in ROM, but still struggling with balance. Patient is demonstrating good  LE strength, with R hip flexion as her only limitation. Patient has previously met her ROM and floor transfer goals. Still demonstrating imbalance; today scored 14/24 on DGI and requiring min A to recover balance during pivot turn today. Discussed patient's progress and remaining impairments. Believe that patient would continue to benefit from skilled PT services 2x/week for 6 weeks to address balance impairments in order to decrease risk of fall and injury. Patient agreeable. Advised patient to perform stretching and light balance HEP, avoiding heavy activity to elevate HR until  her stress test results come back. Patient agreeable and without complaints at end of session.    Comorbidities  RA, GERD, epilepsy, anemia, HLD, polymyalgia rheumatica, HTN, hx breast CA    Rehab Potential  Good    PT Frequency  2x / week    PT Duration  6 weeks    PT Treatment/Interventions  ADLs/Self Care Home Management;Cryotherapy;Electrical Stimulation;Iontophoresis 100m/ml Dexamethasone;Moist Heat;Balance training;Therapeutic exercise;Therapeutic activities;Functional mobility training;Stair training;Gait training;Ultrasound;Neuromuscular re-education;Patient/family education;Manual techniques;Vasopneumatic Device;Taping;Energy conservation;Dry needling;Passive range of motion    PT Next Visit Plan  progress hip flexion strength and balance exercises    Consulted and Agree with Plan of Care  Patient       Patient will benefit from skilled therapeutic intervention in order to improve the following deficits and impairments:  Abnormal gait, Hypomobility, Increased edema, Decreased activity tolerance, Decreased strength, Pain, Difficulty walking, Decreased balance, Decreased range of motion, Improper body mechanics, Postural dysfunction, Impaired flexibility  Visit Diagnosis: Acute pain of right knee  Stiffness of right knee, not elsewhere classified  Muscle weakness (generalized)  Difficulty in walking, not elsewhere classified  Unsteadiness on feet     Problem List There are no active problems to display for this patient.    YJanene Harvey PT, DPT 02/07/19 11:55 AM   [facility] [address]Phone: [phone]  Fax:  [phone] Name: EJHANIYA BRISKIMRN: [id number] of Birth: 2December 29, 1931

## 2019-02-08 ENCOUNTER — Encounter: Payer: Medicare Other | Admitting: Physical Therapy

## 2019-02-14 ENCOUNTER — Other Ambulatory Visit: Payer: Self-pay

## 2019-02-14 ENCOUNTER — Ambulatory Visit: Payer: Medicare Other | Admitting: Physical Therapy

## 2019-02-14 ENCOUNTER — Encounter: Payer: Self-pay | Admitting: Physical Therapy

## 2019-02-14 VITALS — BP 115/70 | HR 132

## 2019-02-14 DIAGNOSIS — M6281 Muscle weakness (generalized): Secondary | ICD-10-CM

## 2019-02-14 DIAGNOSIS — M25561 Pain in right knee: Secondary | ICD-10-CM

## 2019-02-14 DIAGNOSIS — R2681 Unsteadiness on feet: Secondary | ICD-10-CM

## 2019-02-14 DIAGNOSIS — M25661 Stiffness of right knee, not elsewhere classified: Secondary | ICD-10-CM

## 2019-02-14 DIAGNOSIS — R262 Difficulty in walking, not elsewhere classified: Secondary | ICD-10-CM

## 2019-02-14 NOTE — Therapy (Signed)
[facility] [address]  [place], [place], [address] Phone: [phone]   Fax:  [phone]  Physical Therapy Treatment  Patient Details  Name: [name] MRN: [id number] Date of Birth: 30-Jan-1930 Referring Provider (PT): Sydnee Cabal, MD   Encounter Date: 02/14/2019  PT End of Session - 02/14/19 1021    Visit Number  14    Number of Visits  25    Date for PT Re-Evaluation  03/21/19    Authorization Type  [employer]HC Medicare    PT Start Time  0934    PT Stop Time  1014    PT Time Calculation (min)  40 min    Equipment Utilized During Treatment  Gait belt    Activity Tolerance  Patient tolerated treatment well;Patient limited by fatigue    Behavior During Therapy  [facility] for tasks assessed/performed       Past Medical History:  Diagnosis Date  . Arthritis   . GERD (gastroesophageal reflux disease)   . Hypertension   . Polymyalgia rheumatica ([place])   . Seizures ([place])     History reviewed. No pertinent surgical history.  Vitals:   02/14/19 0936 02/14/19 0953  BP: 115/70   Pulse: 78 (!) 132  SpO2: 96% 95%    Subjective Assessment - 02/14/19 0941    Subjective  Did not sleep well last d/t her husband being in poor health. Was told that she had normal results on her stress test on Thursday. Has been walking for 20-30 min outside but is limited by LBP and knee pain.    Pertinent History  RA, GERD, epilepsy, anemia, HLD, polymyalgia rheumatica, HTN, hx breast CA    Diagnostic tests  per patient- had xray which showed arthritis    Patient Stated Goals  get better at walking and keep that joint mobile    Currently in Pain?  Yes    Pain Score  3     Pain Location  Knee    Pain Orientation  Right    Pain Descriptors / Indicators  Aching    Pain Type  Acute pain                       [facility] Adult PT Treatment/Exercise - 02/14/19 0001      Neuro Re-ed    Neuro Re-ed Details   R/L  forward/backward stepping with head turns up/down x10 each       Knee/Hip Exercises: Aerobic   Stationary Bike  L1 x 66mn      Knee/Hip Exercises: Standing   Forward Step Up  Right;Left;1 set;10 reps;Hand Hold: 2;Step Height: 6"    Forward Step Up Limitations  2 ski poles             PT Education - 02/14/19 0959    Education Details  educated patient on incremental increase in walking duration, starting at 10 min to avoid fatigue and pain; instruction and education on use of rhythmic diaphragmatic breathing to decrease HR; edu on taking HR on self    Person(s) Educated  Patient    Methods  Explanation;Demonstration;Tactile cues;Verbal cues    Comprehension  Verbalized understanding;Returned demonstration       PT Short Term Goals - 02/07/19 1111      PT SHORT TERM GOAL #1   Title  Patient to be independent with initial HEP.    Time  3    Period  Weeks  Status  Achieved    Target Date  01/18/19        PT Long Term Goals - 02/14/19 1024      PT LONG TERM GOAL #1   Title  Patient to be independent with advanced HEP.    Time  6    Period  Weeks    Status  Partially Met   met for current     PT LONG TERM GOAL #2   Title  Patient to demonstrate >=4+/5 strength in B LEs.    Time  6    Period  Weeks    Status  Partially Met   only limited in R hip flexion     PT LONG TERM GOAL #3   Title  Patient to demonstrate R knee AROM nonpainful and symmetrical to opposite LE.    Time  6    Period  Weeks    Status  Achieved   AROM 0-126 degrees, PROM 0-130 degrees without pain     PT LONG TERM GOAL #4   Title  Patient to demonstrate floor to stand transfer with min A.    Time  6    Period  Weeks    Status  Achieved      PT LONG TERM GOAL #5   Title  Patient to score >19/24 on DGI to decrease risk of falls.    Time  6    Period  Weeks    Status  On-going   14/24     PT LONG TERM GOAL #6   Title  Patient to demonstrate reciprocal stair climbing up/down 13 steps  with 1 handrail for support with good stability.    Time  6    Period  Weeks    Status  On-going      PT LONG TERM GOAL #7   Title  Patient to report tolerance of walking 1 mile without fatigue or knee pain limiting.    Time  6    Period  Weeks    Status  On-going            Plan - 02/14/19 1021    Clinical Impression Statement  Patient reporting poor night's sleep d/t her husband's poor health. Mentions that her stress test on Thursday was normal. Vitals WFL at beginning of session. Worked on anterior/posterior stepping with head turns and gaze fixation with patient requiring cues for sequencing and step length. Better performance on L LE. Monitored vitals throughout session, with patient requiring sitting rest breaks to allow HR to reach <100bpm. Educated and instructed patient on diaphragmatic breathing to decrease HR as well as instructed patient on how to take her own pulse. Patient reporting that she will be contacting her PCP about her vitals today. Able to perform step ups with much improved speed and care, and requiring less cues to correct form than previous sessions. Ended session with no complaints.    Comorbidities  RA, GERD, epilepsy, anemia, HLD, polymyalgia rheumatica, HTN, hx breast CA    Rehab Potential  Good    PT Frequency  2x / week    PT Duration  6 weeks    PT Treatment/Interventions  ADLs/Self Care Home Management;Cryotherapy;Electrical Stimulation;Iontophoresis 67m/ml Dexamethasone;Moist Heat;Balance training;Therapeutic exercise;Therapeutic activities;Functional mobility training;Stair training;Gait training;Ultrasound;Neuromuscular re-education;Patient/family education;Manual techniques;Vasopneumatic Device;Taping;Energy conservation;Dry needling;Passive range of motion    PT Next Visit Plan  progress hip flexion strength and balance exercises    Consulted and Agree with Plan of Care  Patient  Patient will benefit from skilled therapeutic intervention in  order to improve the following deficits and impairments:  Abnormal gait, Hypomobility, Increased edema, Decreased activity tolerance, Decreased strength, Pain, Difficulty walking, Decreased balance, Decreased range of motion, Improper body mechanics, Postural dysfunction, Impaired flexibility  Visit Diagnosis: Acute pain of right knee  Stiffness of right knee, not elsewhere classified  Muscle weakness (generalized)  Difficulty in walking, not elsewhere classified  Unsteadiness on feet     Problem List There are no active problems to display for this patient.    Janene Harvey, PT, DPT 02/14/19 10:26 AM   [facility] [address]  [address] [place] [place], [place], [address] Phone: [phone]   Fax:  [phone]  Name: [name] MRN: [id number] Date of Birth: 22-Jun-1929

## 2019-02-15 ENCOUNTER — Ambulatory Visit: Payer: Medicare Other

## 2019-02-15 ENCOUNTER — Other Ambulatory Visit: Payer: Self-pay

## 2019-02-15 VITALS — BP 138/70 | HR 78

## 2019-02-15 DIAGNOSIS — M6281 Muscle weakness (generalized): Secondary | ICD-10-CM

## 2019-02-15 DIAGNOSIS — M25561 Pain in right knee: Secondary | ICD-10-CM | POA: Diagnosis not present

## 2019-02-15 DIAGNOSIS — R262 Difficulty in walking, not elsewhere classified: Secondary | ICD-10-CM

## 2019-02-15 DIAGNOSIS — M25661 Stiffness of right knee, not elsewhere classified: Secondary | ICD-10-CM

## 2019-02-15 DIAGNOSIS — R2681 Unsteadiness on feet: Secondary | ICD-10-CM

## 2019-02-15 NOTE — Therapy (Signed)
[facility] [address]  [place], [place], [address] Phone: [phone]   Fax:  [phone]  Physical Therapy Treatment  Patient Details  Name: [name] MRN: [id number] Date of Birth: 1930-03-11 Referring Provider (PT): Sydnee Cabal, MD   Encounter Date: 02/15/2019  PT End of Session - 02/15/19 0953    Visit Number  15    Number of Visits  25    Date for PT Re-Evaluation  03/21/19    Authorization Type  [employer]HC Medicare    PT Start Time  0933    PT Stop Time  1015    PT Time Calculation (min)  42 min    Equipment Utilized During Treatment  Gait belt    Activity Tolerance  Patient tolerated treatment well;Patient limited by fatigue    Behavior During Therapy  [facility] for tasks assessed/performed       Past Medical History:  Diagnosis Date  . Arthritis   . GERD (gastroesophageal reflux disease)   . Hypertension   . Polymyalgia rheumatica ([place])   . Seizures ([place])     No past surgical history on file.  Vitals:   02/15/19 0945 02/15/19 0946  BP: 138/70   Pulse: 78   SpO2: 96% 94%    Subjective Assessment - 02/15/19 0952    Subjective  Pt. reporting she had her primary care MD f/u moved to this month due to concerns over her elevated HR with exercise.    Pertinent History  RA, GERD, epilepsy, anemia, HLD, polymyalgia rheumatica, HTN, hx breast CA    Diagnostic tests  per patient- had xray which showed arthritis    Patient Stated Goals  get better at walking and keep that joint mobile    Currently in Pain?  No/denies    Pain Score  0-No pain    Multiple Pain Sites  No                       [facility] Adult PT Treatment/Exercise - 02/15/19 0001      Ambulation/Gait   Pre-Gait Activities  Ambulation x 180 ft with head turns vertical, horizontal, with cueing from therapist working on maintaining straight line path; pt. still with frequent lateral deviation from straight line path with head  turns and conversation       High Level Balance   High Level Balance Activities  Tandem walking    High Level Balance Comments  x 2 laps down back at counter       Knee/Hip Exercises: Aerobic   Nustep  Lvl 4, 7 min (LEs only)      Knee/Hip Exercises: Standing   Heel Raises  Both;20 reps    Heel Raises Limitations  heel and toe raise     Other Standing Knee Exercises  Alternating toe-clears to 8" step x 12 rpes each LE; light 1 HH support    Other Standing Knee Exercises  Standing alteranting trunk rotation at counter top x 10 reps       Knee/Hip Exercises: Supine   Bridges  Both;15 reps    Straight Leg Raises  Right;Left;10 reps;Strengthening;2 sets    Straight Leg Raises Limitations  Cues for proper ROM               PT Short Term Goals - 02/07/19 1111      PT SHORT TERM GOAL #1   Title  Patient to be independent with initial HEP.  Time  3    Period  Weeks    Status  Achieved    Target Date  01/18/19        PT Long Term Goals - 02/14/19 1024      PT LONG TERM GOAL #1   Title  Patient to be independent with advanced HEP.    Time  6    Period  Weeks    Status  Partially Met   met for current     PT LONG TERM GOAL #2   Title  Patient to demonstrate >=4+/5 strength in B LEs.    Time  6    Period  Weeks    Status  Partially Met   only limited in R hip flexion     PT LONG TERM GOAL #3   Title  Patient to demonstrate R knee AROM nonpainful and symmetrical to opposite LE.    Time  6    Period  Weeks    Status  Achieved   AROM 0-126 degrees, PROM 0-130 degrees without pain     PT LONG TERM GOAL #4   Title  Patient to demonstrate floor to stand transfer with min A.    Time  6    Period  Weeks    Status  Achieved      PT LONG TERM GOAL #5   Title  Patient to score >19/24 on DGI to decrease risk of falls.    Time  6    Period  Weeks    Status  On-going   14/24     PT LONG TERM GOAL #6   Title  Patient to demonstrate reciprocal stair climbing  up/down 13 steps with 1 handrail for support with good stability.    Time  6    Period  Weeks    Status  On-going      PT LONG TERM GOAL #7   Title  Patient to report tolerance of walking 1 mile without fatigue or knee pain limiting.    Time  6    Period  Weeks    Status  On-going            Plan - 02/15/19 0954    Clinical Impression Statement  Pt. reporting she is feeling better today however did have primary care doctor f/u moved up to end of this month due to concerns over elevated HR with exercise as previously discussed with supervising PT last session.  Did require occasional sitting rest breaks throughout standing therex to recover HR to <100 bpm.  Session focused on hip flexion strengthening for improved LE clearance and worked on maintaining straight line balance with gait with head turns as lateral deviation still evident with multitasking with gait.  Ended visit pain free and pt. noting ~ 70% improvement in knee pain since starting therapy.    Personal Factors and Comorbidities  Age;Comorbidity 3+;Time since onset of injury/illness/exacerbation;Past/Current Experience;Fitness    Comorbidities  RA, GERD, epilepsy, anemia, HLD, polymyalgia rheumatica, HTN, hx breast CA    Rehab Potential  Good    PT Treatment/Interventions  ADLs/Self Care Home Management;Cryotherapy;Electrical Stimulation;Iontophoresis '4mg'$ /ml Dexamethasone;Moist Heat;Balance training;Therapeutic exercise;Therapeutic activities;Functional mobility training;Stair training;Gait training;Ultrasound;Neuromuscular re-education;Patient/family education;Manual techniques;Vasopneumatic Device;Taping;Energy conservation;Dry needling;Passive range of motion    PT Next Visit Plan  progress hip flexion strength and balance exercises    Consulted and Agree with Plan of Care  Patient       Patient will benefit from skilled therapeutic intervention in order to improve  the following deficits and impairments:  Abnormal gait,  Hypomobility, Increased edema, Decreased activity tolerance, Decreased strength, Pain, Difficulty walking, Decreased balance, Decreased range of motion, Improper body mechanics, Postural dysfunction, Impaired flexibility  Visit Diagnosis: Acute pain of right knee  Stiffness of right knee, not elsewhere classified  Muscle weakness (generalized)  Difficulty in walking, not elsewhere classified  Unsteadiness on feet     Problem List There are no active problems to display for this patient.   Bess Harvest, PTA 02/15/19 12:52 PM   [facility] [address]  [place], [place], [address] Phone: [phone]   Fax:  [phone]  Name: [name] MRN: [id number] Date of Birth: [birth year]

## 2019-02-19 ENCOUNTER — Ambulatory Visit: Payer: Medicare Other

## 2019-02-19 ENCOUNTER — Other Ambulatory Visit: Payer: Self-pay

## 2019-02-19 VITALS — BP 120/78 | HR 77

## 2019-02-19 DIAGNOSIS — R262 Difficulty in walking, not elsewhere classified: Secondary | ICD-10-CM

## 2019-02-19 DIAGNOSIS — M6281 Muscle weakness (generalized): Secondary | ICD-10-CM

## 2019-02-19 DIAGNOSIS — M25561 Pain in right knee: Secondary | ICD-10-CM

## 2019-02-19 DIAGNOSIS — M25661 Stiffness of right knee, not elsewhere classified: Secondary | ICD-10-CM

## 2019-02-19 DIAGNOSIS — R2681 Unsteadiness on feet: Secondary | ICD-10-CM

## 2019-02-19 NOTE — Therapy (Signed)
[facility] [address]  [place], [place], [address] Phone: [phone]   Fax:  [phone]  Physical Therapy Treatment  Patient Details  Name: [name] MRN: [id number] Date of Birth: [birth year] Referring Provider (PT): Sydnee Cabal, MD   Encounter Date: 02/19/2019  PT End of Session - 02/19/19 1121    Visit Number  16    Number of Visits  25    Date for PT Re-Evaluation  03/21/19    Authorization Type  [employer] Medicare    PT Start Time  1105    PT Stop Time  1145    PT Time Calculation (min)  40 min    Equipment Utilized During Treatment  Gait belt    Activity Tolerance  Patient tolerated treatment well;Patient limited by fatigue    Behavior During Therapy  [facility] for tasks assessed/performed       Past Medical History:  Diagnosis Date  . Arthritis   . GERD (gastroesophageal reflux disease)   . Hypertension   . Polymyalgia rheumatica ([place])   . Seizures ([place])     No past surgical history on file.  Vitals:   02/19/19 1117  BP: 120/78  Pulse: 77  SpO2: 95%    Subjective Assessment - 02/19/19 1117    Subjective  Pt. reporting MD told her she has a "diastolic heart abnormality" and put her on diuretic medication.    Pertinent History  RA, GERD, epilepsy, anemia, HLD, polymyalgia rheumatica, HTN, hx breast CA    Diagnostic tests  per patient- had xray which showed arthritis    Patient Stated Goals  get better at walking and keep that joint mobile    Currently in Pain?  No/denies    Pain Score  0-No pain    Multiple Pain Sites  No                       [facility] Adult PT Treatment/Exercise - 02/19/19 0001      High Level Balance   High Level Balance Activities  Tandem walking;Negotiating over obstacles    High Level Balance Comments  tandem balance forward/backwards x 2 laps down back at counter; Alternating 6" bolster, 1/2 bolster, and 4" bolster step overs with gait belt/CGA and  working on maintaining consistent pacing and avoiding circumduction 2 laps down back 16 step overs       Knee/Hip Exercises: Aerobic   Nustep  Lvl 4, 7 min (LEs only)      Knee/Hip Exercises: Standing   Heel Raises  Both;20 reps    Heel Raises Limitations  heel and toe raise     Hip Flexion  Right;Left;10 reps;Knee straight;Stengthening    Hip Flexion Limitations  yellow looped TB at ankles     Hip Abduction  Right;Left;10 reps;Knee straight;Stengthening    Abduction Limitations  yellow looped TB at ankles     Hip Extension  Right;Left;10 reps;Knee straight;Stengthening    Extension Limitations  yellow TB at ankles     Functional Squat  15 reps;3 seconds    Functional Squat Limitations  counter     Other Standing Knee Exercises  Alternating cone nock over/righting with 2# at ankles and CGA/supervision from therapist x 7 cones                PT Short Term Goals - 02/07/19 1111      PT SHORT TERM GOAL #1   Title  Patient to  be independent with initial HEP.    Time  3    Period  Weeks    Status  Achieved    Target Date  01/18/19        PT Long Term Goals - 02/14/19 1024      PT LONG TERM GOAL #1   Title  Patient to be independent with advanced HEP.    Time  6    Period  Weeks    Status  Partially Met   met for current     PT LONG TERM GOAL #2   Title  Patient to demonstrate >=4+/5 strength in B LEs.    Time  6    Period  Weeks    Status  Partially Met   only limited in R hip flexion     PT LONG TERM GOAL #3   Title  Patient to demonstrate R knee AROM nonpainful and symmetrical to opposite LE.    Time  6    Period  Weeks    Status  Achieved   AROM 0-126 degrees, PROM 0-130 degrees without pain     PT LONG TERM GOAL #4   Title  Patient to demonstrate floor to stand transfer with min A.    Time  6    Period  Weeks    Status  Achieved      PT LONG TERM GOAL #5   Title  Patient to score >19/24 on DGI to decrease risk of falls.    Time  6    Period   Weeks    Status  On-going   14/24     PT LONG TERM GOAL #6   Title  Patient to demonstrate reciprocal stair climbing up/down 13 steps with 1 handrail for support with good stability.    Time  6    Period  Weeks    Status  On-going      PT LONG TERM GOAL #7   Title  Patient to report tolerance of walking 1 mile without fatigue or knee pain limiting.    Time  6    Period  Weeks    Status  On-going            Plan - 02/19/19 1123    Clinical Impression Statement  [name] she started taking MD prescribed diuretic on Saturday however has not felt any adverse side effects.  Vitals remained WFL throughout session today with session focusing on hip flexor strengthening, LE clearance with step-overs, and SLS stability.  Pt. with most difficulty in session with cone nock over/righting with 2# ankle weights demonstrating ongoing proximal hip instability which is also seen with lateral deviation with gait.  Pt. ended visit reporting LE fatigue however pain free.    Personal Factors and Comorbidities  Age;Comorbidity 3+;Time since onset of injury/illness/exacerbation;Past/Current Experience;Fitness    Comorbidities  RA, GERD, epilepsy, anemia, HLD, polymyalgia rheumatica, HTN, hx breast CA    Rehab Potential  Good    PT Treatment/Interventions  ADLs/Self Care Home Management;Cryotherapy;Electrical Stimulation;Iontophoresis 46m/ml Dexamethasone;Moist Heat;Balance training;Therapeutic exercise;Therapeutic activities;Functional mobility training;Stair training;Gait training;Ultrasound;Neuromuscular re-education;Patient/family education;Manual techniques;Vasopneumatic Device;Taping;Energy conservation;Dry needling;Passive range of motion    PT Next Visit Plan  progress hip flexion strength and balance exercises    Consulted and Agree with Plan of Care  Patient       Patient will benefit from skilled therapeutic intervention in order to improve the following deficits and impairments:  Abnormal gait,  Hypomobility, Increased edema, Decreased activity tolerance, Decreased strength,  Pain, Difficulty walking, Decreased balance, Decreased range of motion, Improper body mechanics, Postural dysfunction, Impaired flexibility  Visit Diagnosis: Acute pain of right knee  Stiffness of right knee, not elsewhere classified  Muscle weakness (generalized)  Difficulty in walking, not elsewhere classified  Unsteadiness on feet     Problem List There are no active problems to display for this patient.   Bess Harvest, PTA 02/19/19 12:31 PM   [facility] [address]  [place], [place], [address] Phone: [phone]   Fax:  [phone]  Name: [name] MRN: [id number] Date of Birth: [birth year]

## 2019-02-22 ENCOUNTER — Other Ambulatory Visit: Payer: Self-pay

## 2019-02-22 ENCOUNTER — Ambulatory Visit: Payer: Medicare Other | Admitting: Physical Therapy

## 2019-02-22 ENCOUNTER — Encounter: Payer: Self-pay | Admitting: Physical Therapy

## 2019-02-22 VITALS — BP 120/80 | HR 117

## 2019-02-22 DIAGNOSIS — M6281 Muscle weakness (generalized): Secondary | ICD-10-CM

## 2019-02-22 DIAGNOSIS — M25661 Stiffness of right knee, not elsewhere classified: Secondary | ICD-10-CM

## 2019-02-22 DIAGNOSIS — M25561 Pain in right knee: Secondary | ICD-10-CM

## 2019-02-22 DIAGNOSIS — R262 Difficulty in walking, not elsewhere classified: Secondary | ICD-10-CM

## 2019-02-22 DIAGNOSIS — R2681 Unsteadiness on feet: Secondary | ICD-10-CM

## 2019-02-22 NOTE — Therapy (Signed)
[facility] [address]  [place], [place], [address] Phone: [phone]   Fax:  [phone]  Physical Therapy Treatment  Patient Details  Name: [name] MRN: [id number] Date of Birth: [birth year] Referring Provider (PT): Sydnee Cabal, MD   Encounter Date: 02/22/2019  PT End of Session - 02/22/19 1148    Visit Number  17    Number of Visits  25    Date for PT Re-Evaluation  03/21/19    Authorization Type  UHC Medicare    PT Start Time  1100    PT Stop Time  1146    PT Time Calculation (min)  46 min    Equipment Utilized During Treatment  Gait belt    Activity Tolerance  Patient tolerated treatment well;Patient limited by fatigue    Behavior During Therapy  [facility] for tasks assessed/performed       Past Medical History:  Diagnosis Date  . Arthritis   . GERD (gastroesophageal reflux disease)   . Hypertension   . Polymyalgia rheumatica ([place])   . Seizures ([place])     History reviewed. No pertinent surgical history.  Vitals:   02/22/19 1102 02/22/19 1114 02/22/19 1118  BP: 120/80    Pulse: 76 (!) 122 (!) 117  SpO2: 94% 90% 92%    Subjective Assessment - 02/22/19 1105    Subjective  Reporting improvement in racing heart since being put on a diuretic. Walking 3x/day for 10 minutes- knee is tolerating this well and denies dyspnea with this.    Pertinent History  RA, GERD, epilepsy, anemia, HLD, polymyalgia rheumatica, HTN, hx breast CA    Diagnostic tests  per patient- had xray which showed arthritis    Patient Stated Goals  get better at walking and keep that joint mobile    Currently in Pain?  Yes    Pain Score  4     Pain Location  Knee    Pain Orientation  Right;Lateral    Pain Descriptors / Indicators  Aching    Pain Type  Acute pain                       [facility] Adult PT Treatment/Exercise - 02/22/19 0001      Neuro Re-ed    Neuro Re-ed Details   walking around track +  throwing/catching or bouncing/catching ball while reciting recipe and engaging in conversation; walking and picking up beanbags from the ground      Knee/Hip Exercises: Stretches   Gastroc Stretch  Right;Left;30 seconds;2 reps    Press photographer Limitations  counter      Knee/Hip Exercises: Aerobic   Nustep  Lvl 5, 6 min (LEs only)      Knee/Hip Exercises: Standing   Other Standing Knee Exercises  step up on foam + multiplication by 3's A67 each LE   foot flat and small step length     Knee/Hip Exercises: Seated   Other Seated Knee/Hip Exercises  B dorsiflexion with red TB x15 each LE             PT Education - 02/22/19 1147    Education Details  Update to HEP; edu on use of gastroc stretching and anterior tib strengthening to correct shufflinf gait and reduce risk of falls    Person(s) Educated  Patient    Methods  Explanation;Demonstration;Tactile cues;Verbal cues;Handout    Comprehension  Verbalized understanding;Returned demonstration  PT Short Term Goals - 02/07/19 1111      PT SHORT TERM GOAL #1   Title  Patient to be independent with initial HEP.    Time  3    Period  Weeks    Status  Achieved    Target Date  01/18/19        PT Long Term Goals - 02/14/19 1024      PT LONG TERM GOAL #1   Title  Patient to be independent with advanced HEP.    Time  6    Period  Weeks    Status  Partially Met   met for current     PT LONG TERM GOAL #2   Title  Patient to demonstrate >=4+/5 strength in B LEs.    Time  6    Period  Weeks    Status  Partially Met   only limited in R hip flexion     PT LONG TERM GOAL #3   Title  Patient to demonstrate R knee AROM nonpainful and symmetrical to opposite LE.    Time  6    Period  Weeks    Status  Achieved   AROM 0-126 degrees, PROM 0-130 degrees without pain     PT LONG TERM GOAL #4   Title  Patient to demonstrate floor to stand transfer with min A.    Time  6    Period  Weeks    Status  Achieved      PT LONG  TERM GOAL #5   Title  Patient to score >19/24 on DGI to decrease risk of falls.    Time  6    Period  Weeks    Status  On-going   14/24     PT LONG TERM GOAL #6   Title  Patient to demonstrate reciprocal stair climbing up/down 13 steps with 1 handrail for support with good stability.    Time  6    Period  Weeks    Status  On-going      PT LONG TERM GOAL #7   Title  Patient to report tolerance of walking 1 mile without fatigue or knee pain limiting.    Time  6    Period  Weeks    Status  On-going            Plan - 02/22/19 1148    Clinical Impression Statement  Patient reporting improvement in tachycardia with exertion since being put on a diuretic. Worked on walking with dual task with patient requiring cues to avoid shuffling and increase step height. Monitored vitals during this activity d/t HR and SOB increasing slightly with movement. Allowed for vitals to return to normal before continuing with activities. Worked on stepping up on foam with dual task, with patient demonstrating more instability with L step. Patient continued with shuffling gait with balance exercises, thus, worked on gastroc stretching and dorsiflexion strengthening. Educated patient on importance of the use of gastroc stretching and anterior tib strengthening to correct shuffling gait and reduce risk of falls. Patient reported understanding and without cues at end of session.    Personal Factors and Comorbidities  Age;Comorbidity 3+;Time since onset of injury/illness/exacerbation;Past/Current Experience;Fitness    Comorbidities  RA, GERD, epilepsy, anemia, HLD, polymyalgia rheumatica, HTN, hx breast CA    Rehab Potential  Good    PT Treatment/Interventions  ADLs/Self Care Home Management;Cryotherapy;Electrical Stimulation;Iontophoresis 37m/ml Dexamethasone;Moist Heat;Balance training;Therapeutic exercise;Therapeutic activities;Functional mobility training;Stair training;Gait training;Ultrasound;Neuromuscular  re-education;Patient/family education;Manual techniques;Vasopneumatic  Device;Taping;Energy conservation;Dry needling;Passive range of motion    PT Next Visit Plan  progress hip flexion strength and balance exercises    Consulted and Agree with Plan of Care  Patient       Patient will benefit from skilled therapeutic intervention in order to improve the following deficits and impairments:  Abnormal gait, Hypomobility, Increased edema, Decreased activity tolerance, Decreased strength, Pain, Difficulty walking, Decreased balance, Decreased range of motion, Improper body mechanics, Postural dysfunction, Impaired flexibility  Visit Diagnosis: Acute pain of right knee  Stiffness of right knee, not elsewhere classified  Muscle weakness (generalized)  Difficulty in walking, not elsewhere classified  Unsteadiness on feet     Problem List There are no active problems to display for this patient.    Janene Harvey, PT, DPT 02/22/19 11:58 AM   [facility] [address]  [place], [place], [address] Phone: [phone]   Fax:  [phone]  Name: SHATERIA PATERNOSTRO MRN: [id number] Date of Birth: Apr 20, 1930

## 2019-02-26 ENCOUNTER — Ambulatory Visit: Payer: Medicare Other

## 2019-02-26 ENCOUNTER — Other Ambulatory Visit: Payer: Self-pay

## 2019-02-26 DIAGNOSIS — R2681 Unsteadiness on feet: Secondary | ICD-10-CM

## 2019-02-26 DIAGNOSIS — R262 Difficulty in walking, not elsewhere classified: Secondary | ICD-10-CM

## 2019-02-26 DIAGNOSIS — M25561 Pain in right knee: Secondary | ICD-10-CM

## 2019-02-26 DIAGNOSIS — M25661 Stiffness of right knee, not elsewhere classified: Secondary | ICD-10-CM

## 2019-02-26 DIAGNOSIS — M6281 Muscle weakness (generalized): Secondary | ICD-10-CM

## 2019-02-26 NOTE — Therapy (Signed)
[facility] [address]  [place], [place], [address] Phone: [phone]   Fax:  [phone]  Physical Therapy Treatment  Patient Details  Name: [name] MRN: [id number] Date of Birth: August 31, 1929 Referring Provider (PT): Sydnee Cabal, MD   Encounter Date: 02/26/2019  PT End of Session - 02/26/19 1112    Visit Number  18    Number of Visits  25    Date for PT Re-Evaluation  03/21/19    Authorization Type  UHC Medicare    PT Start Time  1102    PT Stop Time  1149    PT Time Calculation (min)  47 min    Equipment Utilized During Treatment  Gait belt    Activity Tolerance  Patient tolerated treatment well;Patient limited by fatigue    Behavior During Therapy  [facility] for tasks assessed/performed       Past Medical History:  Diagnosis Date  . Arthritis   . GERD (gastroesophageal reflux disease)   . Hypertension   . Polymyalgia rheumatica ([place])   . Seizures ([place])     No past surgical history on file.  There were no vitals filed for this visit.  Subjective Assessment - 02/26/19 1111    Subjective  Pt. noting she is walking 15 min x 2/day and feels she is responding well to this walking increase.    Pertinent History  RA, GERD, epilepsy, anemia, HLD, polymyalgia rheumatica, HTN, hx breast CA    Diagnostic tests  per patient- had xray which showed arthritis    Patient Stated Goals  get better at walking and keep that joint mobile    Currently in Pain?  Yes    Pain Location  Knee    Pain Orientation  Right;Lateral    Pain Descriptors / Indicators  Aching    Pain Type  Acute pain    Pain Frequency  Intermittent    Aggravating Factors   weight bearing, walking    Pain Relieving Factors  sitting down, ice    Effect of Pain on Daily Activities  limits prolonged walking and standing with shopping    Multiple Pain Sites  No                       [facility] Adult PT Treatment/Exercise - 02/26/19  0001      High Level Balance   High Level Balance Activities  Other (comment)    High Level Balance Comments  on airex pad with CGA from therapist: standard stance and staggered stance bean bag toss with varying UE reach x 3 min each way       Knee/Hip Exercises: Aerobic   Nustep  Lvl 5, 6 min (LEs only)      Knee/Hip Exercises: Standing   Hip Flexion  Right;Left;Knee straight;Stengthening   x 12 reps; at TM rail     Hip Flexion Limitations  yellow looped TB at ankles     Hip Abduction  Right;Left;Knee straight;Stengthening   x 12 reps; at TM rail    Abduction Limitations  yellow looped TB at ankles     Hip Extension  Right;Left;Knee straight;Stengthening   x 12 reps; at TM rail    Extension Limitations  yellow TB at ankles       Knee/Hip Exercises: Seated   Sit to Sand  1 set;10 reps;without UE support   + yellow TB at hip ER/abd at knees  PT Education - 02/26/19 1200    Education Details  HEP update; 3way hip kicker with yellow TB issued to pt. for ankles    Person(s) Educated  Patient    Methods  Explanation;Demonstration;Verbal cues;Handout    Comprehension  Verbalized understanding;Returned demonstration;Verbal cues required       PT Short Term Goals - 02/07/19 1111      PT SHORT TERM GOAL #1   Title  Patient to be independent with initial HEP.    Time  3    Period  Weeks    Status  Achieved    Target Date  01/18/19        PT Long Term Goals - 02/14/19 1024      PT LONG TERM GOAL #1   Title  Patient to be independent with advanced HEP.    Time  6    Period  Weeks    Status  Partially Met   met for current     PT LONG TERM GOAL #2   Title  Patient to demonstrate >=4+/5 strength in B LEs.    Time  6    Period  Weeks    Status  Partially Met   only limited in R hip flexion     PT LONG TERM GOAL #3   Title  Patient to demonstrate R knee AROM nonpainful and symmetrical to opposite LE.    Time  6    Period  Weeks    Status  Achieved    AROM 0-126 degrees, PROM 0-130 degrees without pain     PT LONG TERM GOAL #4   Title  Patient to demonstrate floor to stand transfer with min A.    Time  6    Period  Weeks    Status  Achieved      PT LONG TERM GOAL #5   Title  Patient to score >19/24 on DGI to decrease risk of falls.    Time  6    Period  Weeks    Status  On-going   14/24     PT LONG TERM GOAL #6   Title  Patient to demonstrate reciprocal stair climbing up/down 13 steps with 1 handrail for support with good stability.    Time  6    Period  Weeks    Status  On-going      PT LONG TERM GOAL #7   Title  Patient to report tolerance of walking 1 mile without fatigue or knee pain limiting.    Time  6    Period  Weeks    Status  On-going            Plan - 02/26/19 1247    Clinical Impression Statement  [name] reporting she has progressed her walking duration daily to 2 x 15 min increments and responding well.  today's balance activities focused on dynamic tasks on compliant foam surface and tandem, backwards walking with narrow BOS.  Pt. with most difficulty with narrow BOS activities noting some R knee instability requiring light CGA from therapist at times for safety.  Pt. does feel knee pain has improved since start of therapy.  Hep progressed to add additional hip strengthening activities with SLS balance.  Pt. verbalized understanding of HEP update and was able to perform updated HEP safely with UE support.  Will continue to progress toward goals.    Personal Factors and Comorbidities  Age;Comorbidity 3+;Time since onset of injury/illness/exacerbation;Past/Current Experience;Fitness    Comorbidities  RA, GERD, epilepsy, anemia, HLD, polymyalgia rheumatica, HTN, hx breast CA    Rehab Potential  Good    PT Treatment/Interventions  ADLs/Self Care Home Management;Cryotherapy;Electrical Stimulation;Iontophoresis 74m/ml Dexamethasone;Moist Heat;Balance training;Therapeutic exercise;Therapeutic activities;Functional  mobility training;Stair training;Gait training;Ultrasound;Neuromuscular re-education;Patient/family education;Manual techniques;Vasopneumatic Device;Taping;Energy conservation;Dry needling;Passive range of motion    PT Next Visit Plan  progress hip flexion strength and balance exercises    Consulted and Agree with Plan of Care  Patient       Patient will benefit from skilled therapeutic intervention in order to improve the following deficits and impairments:  Abnormal gait, Hypomobility, Increased edema, Decreased activity tolerance, Decreased strength, Pain, Difficulty walking, Decreased balance, Decreased range of motion, Improper body mechanics, Postural dysfunction, Impaired flexibility  Visit Diagnosis: Acute pain of right knee  Stiffness of right knee, not elsewhere classified  Muscle weakness (generalized)  Difficulty in walking, not elsewhere classified  Unsteadiness on feet     Problem List There are no active problems to display for this patient.   [name] PTA 02/26/19 12:51 PM   [facility] [address] [place] [address]Phone: [phone]  Fax:  [phone] Name: Nancy YONTSMRN: [id number] of Birth: 210/02/1930

## 2019-03-01 ENCOUNTER — Other Ambulatory Visit: Payer: Self-pay

## 2019-03-01 ENCOUNTER — Ambulatory Visit: Payer: Medicare Other | Admitting: Physical Therapy

## 2019-03-01 ENCOUNTER — Encounter: Payer: Self-pay | Admitting: Physical Therapy

## 2019-03-01 VITALS — BP 122/55 | HR 118

## 2019-03-01 DIAGNOSIS — R2681 Unsteadiness on feet: Secondary | ICD-10-CM

## 2019-03-01 DIAGNOSIS — M25561 Pain in right knee: Secondary | ICD-10-CM

## 2019-03-01 DIAGNOSIS — R262 Difficulty in walking, not elsewhere classified: Secondary | ICD-10-CM

## 2019-03-01 DIAGNOSIS — M25661 Stiffness of right knee, not elsewhere classified: Secondary | ICD-10-CM

## 2019-03-01 DIAGNOSIS — M6281 Muscle weakness (generalized): Secondary | ICD-10-CM

## 2019-03-01 NOTE — Therapy (Signed)
[facility] [place] [address]  [place], [place], [address] Phone: [phone]   Fax:  [phone]  Physical Therapy Progress Note  Patient Details  Name: [name] MRN: [id number] Date of Birth: Mar 11, 1930 Referring Provider (PT): Sydnee Cabal, MD   Progress Note Reporting Period 02/01/19 to 03/01/19  See note below for Objective Data and Assessment of Progress/Goals.     Encounter Date: 03/01/2019  PT End of Session - 03/01/19 1156    Visit Number  19    Number of Visits  25    Date for PT Re-Evaluation  03/21/19    Authorization Type  [employer] Medicare    PT Start Time  1100    PT Stop Time  1149    PT Time Calculation (min)  49 min    Equipment Utilized During Treatment  Gait belt    Activity Tolerance  Patient tolerated treatment well;Patient limited by pain    Behavior During Therapy  WFL for tasks assessed/performed       Past Medical History:  Diagnosis Date  . Arthritis   . GERD (gastroesophageal reflux disease)   . Hypertension   . Polymyalgia rheumatica ([place])   . Seizures ([place])     History reviewed. No pertinent surgical history.  Vitals:   03/01/19 1101 03/01/19 1131  BP: (!) 122/55   Pulse: 65 (!) 118  SpO2: 96% 96%    Subjective Assessment - 03/01/19 1104    Subjective  Sleepy today. Reports that she does not feel like her balance is getting better. Wonders if it is d/t her inner ear. Denies recent falls.    Pertinent History  RA, GERD, epilepsy, anemia, HLD, polymyalgia rheumatica, HTN, hx breast CA    Diagnostic tests  per patient- had xray which showed arthritis    Patient Stated Goals  get better at walking and keep that joint mobile    Currently in Pain?  Yes    Pain Score  3     Pain Location  Knee    Pain Orientation  Right    Pain Descriptors / Indicators  Aching    Pain Type  Acute pain         [facility] PT Assessment - 03/01/19 0001      AROM   Right Knee Extension   2    Right Knee Flexion  124      PROM   Right Knee Extension  0   pain   Right Knee Flexion  130   pain     Strength   Right Hip Flexion  4+/5    Right Hip ABduction  4+/5    Right Hip ADduction  4+/5    Left Hip Flexion  4+/5    Left Hip ABduction  4+/5    Left Hip ADduction  4+/5    Right Knee Flexion  4+/5    Right Knee Extension  4+/5   mild pain   Left Knee Flexion  4+/5    Left Knee Extension  4+/5    Right Ankle Dorsiflexion  4+/5    Right Ankle Plantar Flexion  4+/5    Left Ankle Dorsiflexion  4+/5    Left Ankle Plantar Flexion  4/5      Dynamic Gait Index   Level Surface  Normal    Change in Gait Speed  Normal    Gait with Horizontal Head Turns  Mild Impairment   mild imbalance w/ L  head turn   Gait with Vertical Head Turns  Normal    Gait and Pivot Turn  Normal    Step Over Obstacle  Moderate Impairment    Step Around Obstacles  Mild Impairment    Steps  Mild Impairment    Total Score  19                   [facility] Adult PT Treatment/Exercise - 03/01/19 0001      Knee/Hip Exercises: Aerobic   Nustep  Lvl 5, 6 min (LEs only)      Modalities   Modalities  Iontophoresis      Iontophoresis   Type of Iontophoresis  Dexamethasone    Location  R lateral knee    Dose  1.0 ml at 51m/ml; 80 mA*min    Time  4-6 hour patch   #1/6            PT Education - 03/01/19 1155    Education Details  edu on objective progress with PT and remaining impairments; advised to F/U with Dr. CTheda Sers edu on use, benefits, precautions/contraindications, and wear time of ionto patch    Person(s) Educated  Patient    Methods  Explanation;Demonstration;Tactile cues;Verbal cues;Handout    Comprehension  Verbalized understanding;Returned demonstration       PT Short Term Goals - 03/01/19 1107      PT SHORT TERM GOAL #1   Title  Patient to be independent with initial HEP.    Time  3    Period  Weeks    Status  Achieved    Target Date  01/18/19         PT Long Term Goals - 03/01/19 1108      PT LONG TERM GOAL #1   Title  Patient to be independent with advanced HEP.    Time  6    Period  Weeks    Status  Partially Met   reports limited compliance d/t unsteadiness     PT LONG TERM GOAL #2   Title  Patient to demonstrate >=4+/5 strength in B LEs.    Time  6    Period  Weeks    Status  Partially Met   good improvement in R hip flexion, with L ankle plantarflexion most limiting     PT LONG TERM GOAL #3   Title  Patient to demonstrate R knee AROM nonpainful and symmetrical to opposite LE.    Time  6    Period  Weeks    Status  Partially Met   mildly limited in AROM extension and with pain limiting     PT LONG TERM GOAL #4   Title  Patient to demonstrate floor to stand transfer with min A.    Time  6    Period  Weeks    Status  Achieved      PT LONG TERM GOAL #5   Title  Patient to score >19/24 on DGI to decrease risk of falls.    Time  6    Period  Weeks    Status  On-going   19/24     PT LONG TERM GOAL #6   Title  Patient to demonstrate reciprocal stair climbing up/down 13 steps with 1 handrail for support with good stability.    Time  6    Period  Weeks    Status  On-going   demonstrating reciprocal pattern with heavy UE use with ascending and descending stairs and  heavy landing on L LE d/t quad instability     PT LONG TERM GOAL #7   Title  Patient to report tolerance of walking 1 mile without fatigue or knee pain limiting.    Time  6    Period  Weeks    Status  On-going   reports 100 feet before onset of knee pain           Plan - 03/01/19 1159    Clinical Impression Statement  Patient initially reporting no improvement in balance since starting PT, however with discussion, reported improved ability to step up onto a curb d/t improvement in stability. Remaining imbalance occurs mostly when walking outside. Patient does not use a cane, but has been instructed on proper use. Thus recommended that patient  try using cane when ambulating outside and also educated patient on orthotic for medial arch support as patient with prominent medial arch collapse. Patient also reporting recent increase in R knee pain since starting to walk 15 min 2 times each day. Admits to limited compliance with HEP d/t unsteadiness. Strength testing revealed improvement in R hip flexion, with L ankle plantarflexion most limiting. Mildly limited in R knee extension and with pain at end range. Patient scored 19/24 on DGI, showing improvement in balance but still scoring within fall risk category.  Patient also limited by R knee pain with stairs and prolonged walking, thus educated patient on ionto use. Patient agreeable to try this modality and reported understanding of all edu provided. No complaint sat end of session. Patient progressing well, with R knee pain somewhat limiting balance and functional activity tolerance.    Personal Factors and Comorbidities  Age;Comorbidity 3+;Time since onset of injury/illness/exacerbation;Past/Current Experience;Fitness    Comorbidities  RA, GERD, epilepsy, anemia, HLD, polymyalgia rheumatica, HTN, hx breast CA    Rehab Potential  Good    PT Treatment/Interventions  ADLs/Self Care Home Management;Cryotherapy;Electrical Stimulation;Iontophoresis 75m/ml Dexamethasone;Moist Heat;Balance training;Therapeutic exercise;Therapeutic activities;Functional mobility training;Stair training;Gait training;Ultrasound;Neuromuscular re-education;Patient/family education;Manual techniques;Vasopneumatic Device;Taping;Energy conservation;Dry needling;Passive range of motion    PT Next Visit Plan  progress ankle strength and balance exercises    Consulted and Agree with Plan of Care  Patient       Patient will benefit from skilled therapeutic intervention in order to improve the following deficits and impairments:  Abnormal gait, Hypomobility, Increased edema, Decreased activity tolerance, Decreased strength, Pain,  Difficulty walking, Decreased balance, Decreased range of motion, Improper body mechanics, Postural dysfunction, Impaired flexibility  Visit Diagnosis: Acute pain of right knee  Stiffness of right knee, not elsewhere classified  Muscle weakness (generalized)  Difficulty in walking, not elsewhere classified  Unsteadiness on feet     Problem List There are no active problems to display for this patient.    YJanene Harvey PT, DPT 03/01/19 12:13 PM   [facility] [address]Phone: [phone]  Fax:  [phone] Name: ESHALONA HARBOURMRN: [id number] of Birth: [birth year]

## 2019-03-01 NOTE — Patient Instructions (Signed)
    IONTOPHORESIS PATIENT PRECAUTIONS & CONTRAINDICATIONS:  Redness under one or both electrodes can occur.  This characterized by a uniform redness that usually disappears within 12 hours of treatment. Small pinhead size blisters may result in response to the drug.  Contact your physician if the problem persists more than 24 hours. On rare occasions, iontophoresis therapy can result in temporary skin reactions such as rash, inflammation, irritation or burns.  The skin reactions may be the result of individual sensitivity to the ionic solution used, the condition of the skin at the start of treatment, reaction to the materials in the electrodes, allergies or sensitivity to dexamethasone, or a poor connection between the patch and your skin.  Discontinue using iontophoresis if you have any of these reactions and report to your therapist. Remove the Patch or electrodes if you have any undue sensation of pain or burning during the treatment and report discomfort to your therapist. Tell your Therapist if you have had known adverse reactions to the application of electrical current. Approximate treatment time is 4-6 hours.  Remove the patch after 6 hours. The Patch can be worn during normal activity, however excessive motion where the electrodes have been placed can cause poor contact between the skin and the electrode or uneven electrical current resulting in greater risk of skin irritation. Keep out of the reach of children.   DO NOT use if you have a cardiac pacemaker or any other electrically sensitive implanted device. DO NOT use if you have a known sensitivity to dexamethasone. DO NOT use during Magnetic Resonance Imaging (MRI). DO NOT use over broken or compromised skin (e.g. sunburn, cuts, or acne) due to the increased risk of skin reaction. DO NOT SHAVE over the area to be treated:  To establish good contact between the Patch and the skin, excessive hair may be clipped. DO NOT place the  Patch or electrodes on or over your eyes, directly over your heart, or brain. DO NOT reuse the Patch or electrodes as this may cause burns to occur.   For questions, please contact your therapist at:  [facility] [place] [address]  [address] [place], [place], [address] Phone: [phone]   Fax:  [phone]  

## 2019-03-05 ENCOUNTER — Ambulatory Visit: Payer: Medicare Other

## 2019-03-05 ENCOUNTER — Other Ambulatory Visit: Payer: Self-pay

## 2019-03-05 ENCOUNTER — Other Ambulatory Visit: Payer: Self-pay | Admitting: Physician Assistant

## 2019-03-05 DIAGNOSIS — M25661 Stiffness of right knee, not elsewhere classified: Secondary | ICD-10-CM

## 2019-03-05 DIAGNOSIS — Z853 Personal history of malignant neoplasm of breast: Secondary | ICD-10-CM

## 2019-03-05 DIAGNOSIS — R2681 Unsteadiness on feet: Secondary | ICD-10-CM

## 2019-03-05 DIAGNOSIS — M25561 Pain in right knee: Secondary | ICD-10-CM

## 2019-03-05 DIAGNOSIS — M6281 Muscle weakness (generalized): Secondary | ICD-10-CM

## 2019-03-05 DIAGNOSIS — R1033 Periumbilical pain: Secondary | ICD-10-CM

## 2019-03-05 DIAGNOSIS — R1013 Epigastric pain: Secondary | ICD-10-CM

## 2019-03-05 DIAGNOSIS — R262 Difficulty in walking, not elsewhere classified: Secondary | ICD-10-CM

## 2019-03-05 DIAGNOSIS — R1031 Right lower quadrant pain: Secondary | ICD-10-CM

## 2019-03-05 NOTE — Therapy (Signed)
[facility] [place] [address]  [place], [place], [address] Phone: [phone]   Fax:  [phone]  Physical Therapy Treatment  Patient Details  Name: [name] MRN: [id number] Date of Birth: 03/13/1930 Referring Provider (PT): Sydnee Cabal, MD   Encounter Date: 03/05/2019  PT End of Session - 03/05/19 1502    Visit Number  20    Number of Visits  25    Date for PT Re-Evaluation  03/21/19    Authorization Type  [employer] Medicare    PT Start Time  9935    PT Stop Time  7017   Ended visit with 10 min moist heat   PT Time Calculation (min)  51 min    Equipment Utilized During Treatment  Gait belt    Activity Tolerance  Patient tolerated treatment well;Patient limited by pain    Behavior During Therapy  [facility] for tasks assessed/performed       Past Medical History:  Diagnosis Date  . Arthritis   . GERD (gastroesophageal reflux disease)   . Hypertension   . Polymyalgia rheumatica ([place])   . Seizures ([place])     No past surgical history on file.  There were no vitals filed for this visit.  Subjective Assessment - 03/05/19 1459    Subjective  Pt. reports her R knee hurts worse today and wishes to focus on this.    Pertinent History  RA, GERD, epilepsy, anemia, HLD, polymyalgia rheumatica, HTN, hx breast CA    Diagnostic tests  per patient- had xray which showed arthritis    Patient Stated Goals  get better at walking and keep that joint mobile    Currently in Pain?  Yes    Pain Score  4     Pain Location  Knee    Pain Orientation  Right    Pain Descriptors / Indicators  Aching    Pain Type  Acute pain    Pain Onset  More than a month ago    Pain Frequency  Intermittent    Aggravating Factors   weight bearing, walking    Multiple Pain Sites  No                       [facility] Adult PT Treatment/Exercise - 03/05/19 0001      Knee/Hip Exercises: Stretches   Passive Hamstring Stretch  Right;30  seconds;1 rep    Passive Hamstring Stretch Limitations  supine strap      Knee/Hip Exercises: Aerobic   Nustep  Lvl 3, 6 min (LEs only)   reduced workload per pt. request      Knee/Hip Exercises: Standing   Heel Raises  Both;20 reps    Heel Raises Limitations  heel and toe raise     Functional Squat  1 set    Functional Squat Limitations  terminated due to R knee pain       Knee/Hip Exercises: Seated   Long Arc Quad  Right;15 reps;Strengthening    Long Arc Quad Weight  --   no weight today to focus on TKE ROM    Long Arc Quad Limitations  Cues for TKE    Other Seated Knee/Hip Exercises  R seated leg press (1 blue, 1 black band) x 15      Knee/Hip Exercises: Supine   Bridges with Ball Squeeze  Both   x 12 reps    Straight Leg Raises  Right;15 reps  Straight Leg Raises Limitations  cue for quad set    Other Supine Knee/Hip Exercises  HS curl with B LE resting on peanut p-ball x 10 reps       Knee/Hip Exercises: Sidelying   Hip ABduction  Right;Left;10 reps      Modalities   Modalities  Moist Heat      Moist Heat Therapy   Number Minutes Moist Heat  10 Minutes    Moist Heat Location  Hip   R hip and R lateral thigh     Manual Therapy   Manual Therapy  Soft tissue mobilization    Manual therapy comments  sidelying     Soft tissue mobilization  STM to R mid ITB/HS in area of tenderness     Passive ROM  Manual R glute, piriformis, ITB, HS stretch with therapist                PT Short Term Goals - 03/01/19 1107      PT SHORT TERM GOAL #1   Title  Patient to be independent with initial HEP.    Time  3    Period  Weeks    Status  Achieved    Target Date  01/18/19        PT Long Term Goals - 03/01/19 1108      PT LONG TERM GOAL #1   Title  Patient to be independent with advanced HEP.    Time  6    Period  Weeks    Status  Partially Met   reports limited compliance d/t unsteadiness     PT LONG TERM GOAL #2   Title  Patient to demonstrate >=4+/5  strength in B LEs.    Time  6    Period  Weeks    Status  Partially Met   good improvement in R hip flexion, with L ankle plantarflexion most limiting     PT LONG TERM GOAL #3   Title  Patient to demonstrate R knee AROM nonpainful and symmetrical to opposite LE.    Time  6    Period  Weeks    Status  Partially Met   mildly limited in AROM extension and with pain limiting     PT LONG TERM GOAL #4   Title  Patient to demonstrate floor to stand transfer with min A.    Time  6    Period  Weeks    Status  Achieved      PT LONG TERM GOAL #5   Title  Patient to score >19/24 on DGI to decrease risk of falls.    Time  6    Period  Weeks    Status  On-going   19/24     PT LONG TERM GOAL #6   Title  Patient to demonstrate reciprocal stair climbing up/down 13 steps with 1 handrail for support with good stability.    Time  6    Period  Weeks    Status  On-going   demonstrating reciprocal pattern with heavy UE use with ascending and descending stairs and heavy landing on L LE d/t quad instability     PT LONG TERM GOAL #7   Title  Patient to report tolerance of walking 1 mile without fatigue or knee pain limiting.    Time  6    Period  Weeks    Status  On-going   reports 100 feet before onset of knee pain  Plan - 03/05/19 1821    Clinical Impression Statement  [name] reporting increased R knee pain today and wished to focus on less balance activities and more seated R knee strengthening activities today.  MT addressing tenderness/tightness in R mid ITB/HS today and therex intensity reduced as pt. with poor tolerance for squat and weight LAQ.  Ended visit with moist heat applied to R hip/lateral thigh musculature for hopeful reduction in tone and lateral knee pain.  Will return to standing balance activities as able in coming sessions.    Personal Factors and Comorbidities  Age;Comorbidity 3+;Time since onset of injury/illness/exacerbation;Past/Current Experience;Fitness     Comorbidities  RA, GERD, epilepsy, anemia, HLD, polymyalgia rheumatica, HTN, hx breast CA    Rehab Potential  Good    PT Treatment/Interventions  ADLs/Self Care Home Management;Cryotherapy;Electrical Stimulation;Iontophoresis 54m/ml Dexamethasone;Moist Heat;Balance training;Therapeutic exercise;Therapeutic activities;Functional mobility training;Stair training;Gait training;Ultrasound;Neuromuscular re-education;Patient/family education;Manual techniques;Vasopneumatic Device;Taping;Energy conservation;Dry needling;Passive range of motion    PT Next Visit Plan  progress ankle strength and balance exercises    Consulted and Agree with Plan of Care  Patient       Patient will benefit from skilled therapeutic intervention in order to improve the following deficits and impairments:  Abnormal gait, Hypomobility, Increased edema, Decreased activity tolerance, Decreased strength, Pain, Difficulty walking, Decreased balance, Decreased range of motion, Improper body mechanics, Postural dysfunction, Impaired flexibility  Visit Diagnosis: Acute pain of right knee  Stiffness of right knee, not elsewhere classified  Muscle weakness (generalized)  Difficulty in walking, not elsewhere classified  Unsteadiness on feet     Problem List There are no active problems to display for this patient.   MBess Harvest PTA 03/05/19 6:26 PM    [place] [address] [place] [address]Phone: [phone]  Fax:  [phone] Name: Nancy TORTORELLAMRN: [id number] of Birth: 2April 05, 1931

## 2019-03-08 ENCOUNTER — Encounter: Payer: Self-pay | Admitting: Physical Therapy

## 2019-03-08 ENCOUNTER — Ambulatory Visit: Payer: Medicare Other | Attending: Specialist | Admitting: Physical Therapy

## 2019-03-08 ENCOUNTER — Other Ambulatory Visit: Payer: Self-pay

## 2019-03-08 VITALS — HR 81

## 2019-03-08 DIAGNOSIS — R262 Difficulty in walking, not elsewhere classified: Secondary | ICD-10-CM | POA: Diagnosis present

## 2019-03-08 DIAGNOSIS — M6281 Muscle weakness (generalized): Secondary | ICD-10-CM | POA: Diagnosis present

## 2019-03-08 DIAGNOSIS — R2681 Unsteadiness on feet: Secondary | ICD-10-CM | POA: Insufficient documentation

## 2019-03-08 DIAGNOSIS — M25661 Stiffness of right knee, not elsewhere classified: Secondary | ICD-10-CM | POA: Diagnosis present

## 2019-03-08 DIAGNOSIS — M25561 Pain in right knee: Secondary | ICD-10-CM | POA: Diagnosis present

## 2019-03-08 NOTE — Therapy (Addendum)
[facility] [address]  [place], [place], [address] Phone: [phone]   Fax:  [phone]  Physical Therapy Treatment  Patient Details  Name: [name] MRN: [id number] Date of Birth: February 19, 1930 Referring Provider (PT): Sydnee Cabal, MD   Progress Note Reporting Period 03/05/19 to 03/08/19  See note below for Objective Data and Assessment of Progress/Goals.     Encounter Date: 03/08/2019  PT End of Session - 03/08/19 1157    Visit Number  21    Number of Visits  25    Date for PT Re-Evaluation  03/21/19    Authorization Type  [employer] Medicare    PT Start Time  1107    PT Stop Time  1151    PT Time Calculation (min)  44 min    Equipment Utilized During Treatment  Gait belt    Activity Tolerance  Patient tolerated treatment well;Patient limited by pain    Behavior During Therapy  WFL for tasks assessed/performed       Past Medical History:  Diagnosis Date  . Arthritis   . GERD (gastroesophageal reflux disease)   . Hypertension   . Polymyalgia rheumatica ([place])   . Seizures ([place])     History reviewed. No pertinent surgical history.  Vitals:   03/08/19 1112  Pulse: 81  SpO2: 94%    Subjective Assessment - 03/08/19 1112    Subjective  Not much is new. Had sharp pains and swelling on outside of R knee yesterday. Did not notice much difference with the use of ionto.  Has not tried using her cane or getting orthotics.    Pertinent History  RA, GERD, epilepsy, anemia, HLD, polymyalgia rheumatica, HTN, hx breast CA    Diagnostic tests  per patient- had xray which showed arthritis    Patient Stated Goals  get better at walking and keep that joint mobile    Currently in Pain?  Yes    Pain Score  2     Pain Location  Knee    Pain Orientation  Right    Pain Descriptors / Indicators  Aching    Pain Type  Acute pain                       [facility] Adult PT Treatment/Exercise - 03/08/19  0001      Neuro Re-ed    Neuro Re-ed Details   stepping forward/back over threshhold with intermittent 1 UE support on counter 15x each LE; calling out cone colors + toe tap to cone with intermitent 1 UR support on counter x10 each side; romberg stance + ball throw/catch x3 min   CGA for safety     Knee/Hip Exercises: Stretches   Gastroc Stretch  Right;Left;30 seconds;2 reps    Press photographer Limitations  at UBE   corrected ot bring toes to the front     Knee/Hip Exercises: Aerobic   Nustep  Lvl 3, 6 min (LEs only)      Knee/Hip Exercises: Standing   Heel Raises  Both;2 sets;10 reps    Heel Raises Limitations  B eccentric heel raise at UBE   visible muscle fatigue            PT Education - 03/08/19 1152    Education Details  update to HEP; encouragement to follow previous recommendation on F/U with MD, use of SPC when walking outside, and use of orthotics as instructed at previous session  Person(s) Educated  Patient    Methods  Explanation;Demonstration;Tactile cues;Verbal cues;Handout    Comprehension  Verbalized understanding;Returned demonstration       PT Short Term Goals - 03/01/19 1107      PT SHORT TERM GOAL #1   Title  Patient to be independent with initial HEP.    Time  3    Period  Weeks    Status  Achieved    Target Date  01/18/19        PT Long Term Goals - 03/01/19 1108      PT LONG TERM GOAL #1   Title  Patient to be independent with advanced HEP.    Time  6    Period  Weeks    Status  Partially Met   reports limited compliance d/t unsteadiness     PT LONG TERM GOAL #2   Title  Patient to demonstrate >=4+/5 strength in B LEs.    Time  6    Period  Weeks    Status  Partially Met   good improvement in R hip flexion, with L ankle plantarflexion most limiting     PT LONG TERM GOAL #3   Title  Patient to demonstrate R knee AROM nonpainful and symmetrical to opposite LE.    Time  6    Period  Weeks    Status  Partially Met   mildly  limited in AROM extension and with pain limiting     PT LONG TERM GOAL #4   Title  Patient to demonstrate floor to stand transfer with min A.    Time  6    Period  Weeks    Status  Achieved      PT LONG TERM GOAL #5   Title  Patient to score >19/24 on DGI to decrease risk of falls.    Time  6    Period  Weeks    Status  On-going   19/24     PT LONG TERM GOAL #6   Title  Patient to demonstrate reciprocal stair climbing up/down 13 steps with 1 handrail for support with good stability.    Time  6    Period  Weeks    Status  On-going   demonstrating reciprocal pattern with heavy UE use with ascending and descending stairs and heavy landing on L LE d/t quad instability     PT LONG TERM GOAL #7   Title  Patient to report tolerance of walking 1 mile without fatigue or knee pain limiting.    Time  6    Period  Weeks    Status  On-going   reports 100 feet before onset of knee pain           Plan - 03/08/19 1157    Clinical Impression Statement  Patient reporting increase in R knee pain and edema yesterday without known cause, which has since resolved. Worked on challenging calf strengthening ther-ex with patient demonstrating difficulty and muscle fatigue. Continued with dynamic balance training with and without dual task. Patient intermittently requiring UE support to recover balance and requiring cues to slow down or correct foot positioning. Demonstrating improved balance on R LE vs L LE today, however reporting mild R knee pain with SLS activities. Updated HEP with eccentric heel raise with UE support for safety. Patient reported understanding but with no complaints at end of session.    Comorbidities  RA, GERD, epilepsy, anemia, HLD, polymyalgia rheumatica, HTN, hx breast CA  Rehab Potential  Good    PT Treatment/Interventions  ADLs/Self Care Home Management;Cryotherapy;Electrical Stimulation;Iontophoresis 67m/ml Dexamethasone;Moist Heat;Balance training;Therapeutic  exercise;Therapeutic activities;Functional mobility training;Stair training;Gait training;Ultrasound;Neuromuscular re-education;Patient/family education;Manual techniques;Vasopneumatic Device;Taping;Energy conservation;Dry needling;Passive range of motion    PT Next Visit Plan  progress ankle strength and balance exercises    Consulted and Agree with Plan of Care  Patient       Patient will benefit from skilled therapeutic intervention in order to improve the following deficits and impairments:  Abnormal gait, Hypomobility, Increased edema, Decreased activity tolerance, Decreased strength, Pain, Difficulty walking, Decreased balance, Decreased range of motion, Improper body mechanics, Postural dysfunction, Impaired flexibility  Visit Diagnosis: Acute pain of right knee  Stiffness of right knee, not elsewhere classified  Muscle weakness (generalized)  Difficulty in walking, not elsewhere classified  Unsteadiness on feet     Problem List There are no active problems to display for this patient.    YJanene Harvey PT, DPT 03/08/19 12:03 PM   [facility] [address] [place] [address]Phone: [phone]  Fax:  [phone] Name: Nancy KENDRICKMRN: [id number] of Birth: [birth year] PHYSICAL THERAPY DISCHARGE SUMMARY  Visits from Start of Care: 21  Current functional level related to goals / functional outcomes: Unable to assess; patient requested to try performing her HEP on her own   Remaining deficits: Unable to assess   Education / Equipment: HEP  Plan: Patient agrees to discharge.  Patient goals were partially met. Patient is being discharged due to the patient's request.  ?????     YJanene Harvey PT, DPT 04/11/19 3:51 PM

## 2019-03-12 ENCOUNTER — Ambulatory Visit: Payer: Medicare Other

## 2019-03-12 ENCOUNTER — Ambulatory Visit
Admission: RE | Admit: 2019-03-12 | Discharge: 2019-03-12 | Disposition: A | Discharge status: Home / Self Care | Payer: Medicare Other | Source: Ambulatory Visit | Attending: Physician Assistant | Admitting: Physician Assistant

## 2019-03-12 DIAGNOSIS — Z853 Personal history of malignant neoplasm of breast: Secondary | ICD-10-CM

## 2019-03-12 DIAGNOSIS — R1033 Periumbilical pain: Secondary | ICD-10-CM

## 2019-03-12 DIAGNOSIS — R1031 Right lower quadrant pain: Secondary | ICD-10-CM

## 2019-03-12 DIAGNOSIS — R1013 Epigastric pain: Secondary | ICD-10-CM

## 2019-03-12 MED ORDER — IOPAMIDOL (ISOVUE-300) INJECTION 61%
100.0000 mL | Freq: Once | INTRAVENOUS | Status: AC | PRN
  Administered 2019-03-12: 13:00:00 100 mL via INTRAVENOUS

## 2019-03-15 ENCOUNTER — Ambulatory Visit: Payer: Medicare Other

## 2019-03-19 ENCOUNTER — Ambulatory Visit: Payer: Medicare Other | Admitting: Physical Therapy

## 2019-03-22 ENCOUNTER — Encounter: Payer: Medicare Other | Admitting: Physical Therapy

## 2019-05-30 ENCOUNTER — Emergency Department (HOSPITAL_BASED_OUTPATIENT_CLINIC_OR_DEPARTMENT_OTHER)
Admission: EM | Admit: 2019-05-30 | Discharge: 2019-05-30 | Disposition: A | Discharge status: Home / Self Care | Payer: Medicare Other | Attending: Emergency Medicine | Admitting: Emergency Medicine

## 2019-05-30 ENCOUNTER — Other Ambulatory Visit: Payer: Self-pay

## 2019-05-30 ENCOUNTER — Emergency Department (HOSPITAL_BASED_OUTPATIENT_CLINIC_OR_DEPARTMENT_OTHER): Payer: Medicare Other

## 2019-05-30 ENCOUNTER — Encounter (HOSPITAL_BASED_OUTPATIENT_CLINIC_OR_DEPARTMENT_OTHER): Payer: Self-pay

## 2019-05-30 DIAGNOSIS — S20212A Contusion of left front wall of thorax, initial encounter: Secondary | ICD-10-CM

## 2019-05-30 DIAGNOSIS — I10 Essential (primary) hypertension: Secondary | ICD-10-CM | POA: Insufficient documentation

## 2019-05-30 DIAGNOSIS — Y999 Unspecified external cause status: Secondary | ICD-10-CM | POA: Diagnosis not present

## 2019-05-30 DIAGNOSIS — W010XXA Fall on same level from slipping, tripping and stumbling without subsequent striking against object, initial encounter: Secondary | ICD-10-CM | POA: Diagnosis not present

## 2019-05-30 DIAGNOSIS — Z79899 Other long term (current) drug therapy: Secondary | ICD-10-CM | POA: Insufficient documentation

## 2019-05-30 DIAGNOSIS — Y939 Activity, unspecified: Secondary | ICD-10-CM | POA: Insufficient documentation

## 2019-05-30 DIAGNOSIS — Y929 Unspecified place or not applicable: Secondary | ICD-10-CM | POA: Diagnosis not present

## 2019-05-30 MED ORDER — HYDROCODONE-ACETAMINOPHEN 5-325 MG PO TABS
1.0000 | ORAL_TABLET | Freq: Once | ORAL | Status: AC
  Administered 2019-05-30: 1 via ORAL
  Filled 2019-05-30: qty 1

## 2019-05-30 NOTE — Discharge Instructions (Addendum)
Use Robaxin as you have prescribed, take 1000 mg of Tylenol 4 times a day for the next several days for pain, take 600 mg of Motrin every 8 hours as needed for pain as well.  Recommend you purchase lidocaine patches over-the-counter for pain relief as well

## 2019-05-30 NOTE — ED Provider Notes (Signed)
[facility] EMERGENCY DEPARTMENT Provider Note   CSN: [id number] Arrival date & time: 05/30/19  1854     History Chief Complaint  Patient presents with  . Fall    [name] is a 85 y.o. female.  The history is provided by the patient.  Fall This is a new problem. The current episode started less than 1 hour ago. The problem occurs rarely. The problem has been resolved. Pertinent negatives include no chest pain, no abdominal pain, no headaches and no shortness of breath. Associated symptoms comments: Left posterior rib pain . The symptoms are aggravated by twisting. Nothing relieves the symptoms. She has tried acetaminophen for the symptoms. The treatment provided mild relief.       Past Medical History:  Diagnosis Date  . Arthritis   . GERD (gastroesophageal reflux disease)   . Hypertension   . Polymyalgia rheumatica ([place])   . Seizures ([place])     There are no problems to display for this patient.   History reviewed. No pertinent surgical history.   OB History   No obstetric history on file.     No family history on file.  Social History   Tobacco Use  . Smoking status: Never Smoker  . Smokeless tobacco: Never Used  Substance Use Topics  . Alcohol use: Never  . Drug use: Never    Home Medications Prior to Admission medications   Medication Sig Start Date End Date Taking? Authorizing Provider  acetaminophen (TYLENOL) 500 MG tablet Take 2 tablets (1,000 mg total) by mouth every 6 (six) hours as needed. 07/09/18   Charlesetta Shanks, MD  amLODipine (NORVASC) 5 MG tablet Take by mouth. 07/03/16   [provider]  celecoxib (CELEBREX) 200 MG capsule Take by mouth. 05/11/16   [provider]  escitalopram (LEXAPRO) 10 MG tablet Take 10 mg by mouth daily.    [provider]  ezetimibe (ZETIA) 10 MG tablet Take 10 mg by mouth daily.    [provider]  famotidine (PEPCID) 20 MG tablet Take 20 mg by mouth 2 (two) times  daily.    [provider]  fluticasone (FLONASE) 50 MCG/ACT nasal spray Place into the nose. 02/03/18 02/03/19  [provider]  hydrochlorothiazide (HYDRODIURIL) 12.5 MG tablet Take 12.5 mg by mouth daily.    [provider]  Multiple Vitamin (MULTIVITAMIN) tablet Take by mouth.    [provider]  ondansetron (ZOFRAN ODT) 4 MG disintegrating tablet Take 1 tablet (4 mg total) by mouth every 4 (four) hours as needed for nausea or vomiting. 07/09/18   Charlesetta Shanks, MD  pantoprazole (PROTONIX) 40 MG tablet Take by mouth. 01/14/14   [provider]  PHENobarbital (LUMINAL) 97.2 MG tablet Take by mouth. 05/17/16   [provider]  predniSONE (DELTASONE) 5 MG tablet Take by mouth. 02/22/18   [provider]    Allergies    Penicillins  Review of Systems   Review of Systems  Constitutional: Negative for chills and fever.  HENT: Negative for ear pain and sore throat.   Eyes: Negative for pain and visual disturbance.  Respiratory: Negative for cough and shortness of breath.   Cardiovascular: Negative for chest pain and palpitations.  Gastrointestinal: Negative for abdominal pain and vomiting.  Genitourinary: Negative for dysuria and hematuria.  Musculoskeletal: Positive for back pain. Negative for arthralgias.  Skin: Negative for color change and rash.  Neurological: Negative for dizziness, seizures, facial asymmetry, speech difficulty, light-headedness, numbness and headaches.  All other systems reviewed and are negative.   Physical Exam Updated Vital Signs BP 126/80 (BP Location: Right Arm)   Pulse 73   Temp 98 F (36.7 C) (Oral)   Resp 19   Ht 5\' 2"  (1.575 m)   Wt 68 kg   LMP  (LMP Unknown)   SpO2 95%   BMI 27.44 kg/m   Physical Exam Vitals and nursing note reviewed.  Constitutional:      General: She is not in acute distress.    Appearance: She is well-developed.  HENT:     Head: Normocephalic and atraumatic.      Nose: Nose normal.     Mouth/Throat:     Mouth: Mucous membranes are moist.  Eyes:     Extraocular Movements: Extraocular movements intact.     Conjunctiva/sclera: Conjunctivae normal.     Pupils: Pupils are equal, round, and reactive to light.  Cardiovascular:     Rate and Rhythm: Normal rate and regular rhythm.     Pulses: Normal pulses.     Heart sounds: Normal heart sounds. No murmur.  Pulmonary:     Effort: Pulmonary effort is normal. No respiratory distress.     Breath sounds: Normal breath sounds.  Abdominal:     General: Abdomen is flat. There is no distension.     Palpations: Abdomen is soft.     Tenderness: There is no abdominal tenderness.  Musculoskeletal:        General: Tenderness present. Normal range of motion.     Cervical back: Normal range of motion and neck supple. No tenderness.     Comments: Tenderness to posterior upper left-sided ribs, no midline spinal tenderness  Skin:    General: Skin is warm and dry.  Neurological:     General: No focal deficit present.     Mental Status: She is alert and oriented to person, place, and time.     Cranial Nerves: No cranial nerve deficit.     Sensory: No sensory deficit.     Motor: No weakness.     Coordination: Coordination normal.     ED Results / Procedures / Treatments   Labs (all labs ordered are listed, but only abnormal results are displayed) Labs Reviewed - No data to display  EKG None  Radiology DG Chest 2 View  Result Date: 05/30/2019 CLINICAL DATA:  85 year old female with left posterior chest wall pain. EXAM: CHEST - 2 VIEW COMPARISON:  Chest radiograph dated 02/01/2019. FINDINGS: There is no focal consolidation, pleural effusion, or pneumothorax. The cardiac silhouette is within normal limits. The aorta is tortuous. Atherosclerotic calcification of the aortic arch. There is osteopenia with degenerative changes of the spine. No acute osseous pathology. Right chest wall surgical clips. IMPRESSION: No  active cardiopulmonary disease.  No interval change. Electronically Signed   By: Anner Crete M.D.   On: 05/30/2019 19:56    Procedures Procedures (including critical care time)  Medications Ordered in ED Medications  HYDROcodone-acetaminophen (NORCO/VICODIN) 5-325 MG per tablet 1 tablet (1 tablet Oral Given 05/30/19 1924)    ED Course  I have reviewed the triage vital signs and the nursing notes.  Pertinent labs & imaging results that were available during my care of the patient were reviewed by me and considered in my medical decision making (see chart for details).    MDM Rules/Calculators/A&P                      Estill Bamberg L Nabers  is an 85 year old female with history of arthritis, hypertension, seizures who presents to the ED with left upper back pain after mechanical fall.  Patient with normal vitals.  No fever.  Patient not on blood thinners.  No loss of consciousness.  Did not hit her head.  Patient lost her balance while trying to take out garbage.  She hit the left side of her posterior ribs on a hard object.  Has had some pain when she takes a deep breath in.  Has good breath sounds bilaterally.  Tender over posterior left-sided ribs.  Likely contusion versus fractures.  Chest x-ray to be obtained to evaluate for fractures/pneumothorax.  Will give Norco and reevaluate.  Has no midline spinal pain.  Chest x-ray shows no pneumothorax.  No rib fracture.  Recommend conservative treatment at home with Motrin, Tylenol, lidocaine patch.  Given prescription for muscle relaxant.  Discharged from ED in good condition.  Suspect rib contusion.  This chart was dictated using voice recognition software.  Despite best efforts to proofread,  errors can occur which can change the documentation meaning.   Final Clinical Impression(s) / ED Diagnoses Final diagnoses:  Contusion of rib on left side, initial encounter    Rx / DC Orders ED Discharge Orders    None       Lennice Sites,  DO 05/30/19 2021

## 2019-05-30 NOTE — ED Triage Notes (Addendum)
Pt states she lost her balance and fell ~525pm-pain to mid/upper back-assisted from car to tx area via w/c

## 2019-08-17 ENCOUNTER — Ambulatory Visit: Payer: Medicare Other | Attending: Internal Medicine

## 2019-08-17 DIAGNOSIS — Z23 Encounter for immunization: Secondary | ICD-10-CM

## 2019-08-17 NOTE — Progress Notes (Signed)
   Covid-19 Vaccination Clinic  Name:  [name]    MRN: [id number] DOB: [birth year]  08/17/2019  [name] was observed post Covid-19 immunization for 30 minutes based on pre-vaccination screening without incident. She was provided with Vaccine Information Sheet and instruction to access the V-Safe system.   [name] was instructed to call 911 with any severe reactions post vaccine: Marland Kitchen Difficulty breathing  . Swelling of face and throat  . A fast heartbeat  . A bad rash all over body  . Dizziness and weakness   Immunizations Administered    Name Date Dose VIS Date Route   Pfizer COVID-19 Vaccine 08/17/2019  2:46 PM 0.3 mL 05/18/2019 Intramuscular   Manufacturer: [employer]   Lot: [id number]   [place]: [id number]

## 2019-09-10 ENCOUNTER — Ambulatory Visit: Payer: Medicare Other | Attending: Internal Medicine

## 2019-09-10 DIAGNOSIS — Z23 Encounter for immunization: Secondary | ICD-10-CM

## 2019-09-10 NOTE — Progress Notes (Signed)
   Covid-19 Vaccination Clinic  Name:  [name]    MRN: [id number] DOB: [birth year]  09/10/2019  [name] was observed post Covid-19 immunization for 15 minutes without incident. She was provided with Vaccine Information Sheet and instruction to access the V-Safe system.   [name] was instructed to call 911 with any severe reactions post vaccine: Marland Kitchen Difficulty breathing  . Swelling of face and throat  . A fast heartbeat  . A bad rash all over body  . Dizziness and weakness   Immunizations Administered    Name Date Dose VIS Date Route   Pfizer COVID-19 Vaccine 09/10/2019  3:31 PM 0.3 mL 05/18/2019 Intramuscular   Manufacturer: [employer]   Lot: [id number]   [place]: [id number]

## 2019-10-18 ENCOUNTER — Encounter (HOSPITAL_COMMUNITY): Admission: RE | Admit: 2019-10-18 | Payer: Medicare Other | Source: Ambulatory Visit

## 2019-10-19 ENCOUNTER — Encounter (HOSPITAL_COMMUNITY): Payer: Medicare Other

## 2019-12-26 NOTE — Patient Instructions (Addendum)
DUE TO COVID-19 ONLY ONE VISITOR IS ALLOWED TO COME WITH YOU AND STAY IN THE WAITING ROOM ONLY DURING PRE OP AND PROCEDURE DAY OF SURGERY. THE 1 VISITOR MAY VISIT WITH YOU AFTER SURGERY IN YOUR PRIVATE ROOM DURING VISITING HOURS ONLY!  YOU NEED TO HAVE A COVID 19 TEST ON: 01/01/20 @ 11:00 am , THIS TEST MUST BE DONE BEFORE SURGERY, COME  [address], [place] , [id number].  ([place]) ONCE YOUR COVID TEST IS COMPLETED, PLEASE BEGIN THE QUARANTINE INSTRUCTIONS AS OUTLINED IN YOUR HANDOUT.                [place]    Your procedure is scheduled on: 01/04/20   Report to [facility] Main  Entrance   Report to short stay at: 5:30 AM     Call this number if you have problems the morning of surgery [phone]    Remember: Do not eat food or drink liquids :After Midnight.   BRUSH YOUR TEETH MORNING OF SURGERY AND RINSE YOUR MOUTH OUT, NO CHEWING GUM CANDY OR MINTS.     Take these medicines the morning of surgery with A SIP OF WATER: amlodipine,escitalopram,ezetimibe,famotidine,pantoprazole,prednisone. Use inhalers and flonase as usual.                                 You may not have any metal on your body including hair pins and              piercings  Do not wear jewelry, make-up, lotions, powders or perfumes, deodorant             Do not wear nail polish on your fingernails.  Do not shave  48 hours prior to surgery.                 Do not bring valuables to the hospital. [place].  Contacts, dentures or bridgework may not be worn into surgery.  Leave suitcase in the car. After surgery it may be brought to your room.     Patients discharged the day of surgery will not be allowed to drive home. IF YOU ARE HAVING SURGERY AND GOING HOME THE SAME DAY, YOU MUST HAVE AN ADULT TO DRIVE YOU HOME AND BE WITH YOU FOR 24 HOURS. YOU MAY GO HOME BY TAXI OR UBER OR ORTHERWISE, BUT AN ADULT MUST ACCOMPANY YOU  HOME AND STAY WITH YOU FOR 24 HOURS.  Name and phone number of your driver:  Special Instructions: N/A              Please read over the following fact sheets you were given: _____________________________________________________________________         [facility] - Preparing for Surgery Before surgery, you can play an important role.  Because skin is not sterile, your skin needs to be as free of germs as possible.  You can reduce the number of germs on your skin by washing with CHG (chlorahexidine gluconate) soap before surgery.  CHG is an antiseptic cleaner which kills germs and bonds with the skin to continue killing germs even after washing. Please DO NOT use if you have an allergy to CHG or antibacterial soaps.  If your skin becomes reddened/irritated stop using the CHG and inform your nurse when you arrive at  Short Stay. Do not shave (including legs and underarms) for at least 48 hours prior to the first CHG shower.  You may shave your face/neck. Please follow these instructions carefully:  1.  Shower with CHG Soap the night before surgery and the  morning of Surgery.  2.  If you choose to wash your hair, wash your hair first as usual with your  normal  shampoo.  3.  After you shampoo, rinse your hair and body thoroughly to remove the  shampoo.                           4.  Use CHG as you would any other liquid soap.  You can apply chg directly  to the skin and wash                       Gently with a scrungie or clean washcloth.  5.  Apply the CHG Soap to your body ONLY FROM THE NECK DOWN.   Do not use on face/ open                           Wound or open sores. Avoid contact with eyes, ears mouth and genitals (private parts).                       Wash face,  Genitals (private parts) with your normal soap.             6.  Wash thoroughly, paying special attention to the area where your surgery  will be performed.  7.  Thoroughly rinse your body with warm water from the neck down.  8.  DO  NOT shower/wash with your normal soap after using and rinsing off  the CHG Soap.                9.  Pat yourself dry with a clean towel.            10.  Wear clean pajamas.            11.  Place clean sheets on your bed the night of your first shower and do not  sleep with pets. Day of Surgery : Do not apply any lotions/deodorants the morning of surgery.  Please wear clean clothes to the hospital/surgery center.  FAILURE TO FOLLOW THESE INSTRUCTIONS MAY RESULT IN THE CANCELLATION OF YOUR SURGERY PATIENT SIGNATURE_________________________________  NURSE SIGNATURE__________________________________  ________________________________________________________________________

## 2019-12-26 NOTE — H&P (View-Only) (Signed)
Pt. Need orders for the upcoming surgery.PST appointment on 12/27/19.Thanks.

## 2019-12-26 NOTE — Progress Notes (Signed)
Pt. Need orders for the upcoming surgery.PST appointment on 12/27/19.Thanks.

## 2019-12-27 ENCOUNTER — Encounter (HOSPITAL_COMMUNITY): Payer: Self-pay

## 2019-12-27 ENCOUNTER — Other Ambulatory Visit: Payer: Self-pay

## 2019-12-27 ENCOUNTER — Encounter (HOSPITAL_COMMUNITY)
Admission: RE | Admit: 2019-12-27 | Discharge: 2019-12-27 | Disposition: A | Discharge status: Home / Self Care | Payer: Medicare Other | Source: Ambulatory Visit | Attending: Specialist | Admitting: Specialist

## 2019-12-27 DIAGNOSIS — Z01812 Encounter for preprocedural laboratory examination: Secondary | ICD-10-CM | POA: Insufficient documentation

## 2019-12-27 HISTORY — DX: Malignant (primary) neoplasm, unspecified: C80.1

## 2019-12-27 HISTORY — DX: Pneumonia, unspecified organism: J18.9

## 2019-12-27 LAB — SURGICAL PCR SCREEN
MRSA, PCR: NEGATIVE
Staphylococcus aureus: NEGATIVE

## 2019-12-27 NOTE — Progress Notes (Signed)
COVID Vaccine Completed:yes Date COVID Vaccine completed:09/10/19 COVID vaccine manufacturer: *Pfizer    [employer]   PCP - Dr. Leafy Ro. LOV: 12/21/19  Cardiologist -   Chest x-ray - 05/30/19.  EKG - 02/02/19 EPIC Stress Test -  ECHO -  Cardiac Cath -   Sleep Study -  CPAP -   Fasting Blood Sugar -  Checks Blood Sugar _____ times a day  Blood Thinner Instructions: Aspirin Instructions: Last Dose:  Anesthesia review: Hx: HTN,Seizures(last episode was 1987)  Patient denies shortness of breath, fever, cough and chest pain at PAT appointment   Patient verbalized understanding of instructions that were given to them at the PAT appointment. Patient was also instructed that they will need to review over the PAT instructions again at home before surgery.

## 2019-12-27 NOTE — Progress Notes (Signed)
A copy of cbc and differential and cmp are in the chart from 12/21/19 done at PCP office.Also available at care everywhere.

## 2020-01-01 ENCOUNTER — Other Ambulatory Visit (HOSPITAL_COMMUNITY)
Admission: RE | Admit: 2020-01-01 | Discharge: 2020-01-01 | Disposition: A | Discharge status: Home / Self Care | Payer: Medicare Other | Source: Ambulatory Visit | Attending: Specialist | Admitting: Specialist

## 2020-01-01 DIAGNOSIS — Z01812 Encounter for preprocedural laboratory examination: Secondary | ICD-10-CM | POA: Diagnosis present

## 2020-01-01 DIAGNOSIS — Z20822 Contact with and (suspected) exposure to covid-19: Secondary | ICD-10-CM | POA: Diagnosis not present

## 2020-01-01 LAB — SARS CORONAVIRUS 2 (TAT 6-24 HRS): SARS Coronavirus 2: NEGATIVE

## 2020-01-03 NOTE — Anesthesia Preprocedure Evaluation (Addendum)
Anesthesia Evaluation  Patient identified by MRN, date of birth, ID band Patient awake    Reviewed: Allergy & Precautions, NPO status , Patient's Chart, lab work & pertinent test results  Airway Mallampati: III  TM Distance: >3 FB Neck ROM: Full    Dental no notable dental hx. (+) Teeth Intact, Dental Advisory Given   Pulmonary    Pulmonary exam normal breath sounds clear to auscultation       Cardiovascular hypertension, Pt. on medications Normal cardiovascular exam Rhythm:Regular Rate:Normal     Neuro/Psych Seizures -, Well Controlled,  negative neurological ROS  negative psych ROS   GI/Hepatic GERD  Medicated,  Endo/Other    Renal/GU   negative genitourinary   Musculoskeletal  (+) Arthritis , Osteoarthritis,    Abdominal   Peds  Hematology   Anesthesia Other Findings   Reproductive/Obstetrics                            Anesthesia Physical Anesthesia Plan  ASA: III  Anesthesia Plan: Spinal and MAC   Post-op Pain Management:  Regional for Post-op pain   Induction:   PONV Risk Score and Plan:   Airway Management Planned: Nasal Cannula and Simple Face Mask  Additional Equipment:   Intra-op Plan:   Post-operative Plan:   Informed Consent:   Plan Discussed with: Anesthesiologist and CRNA  Anesthesia Plan Comments: (  )       Anesthesia Quick Evaluation

## 2020-01-04 ENCOUNTER — Other Ambulatory Visit: Payer: Self-pay

## 2020-01-04 ENCOUNTER — Encounter (HOSPITAL_COMMUNITY)
Admission: RE | Disposition: A | Discharge status: Home / Self Care | Payer: Self-pay | Source: Home / Self Care | Attending: Specialist

## 2020-01-04 ENCOUNTER — Ambulatory Visit (HOSPITAL_COMMUNITY): Payer: Medicare Other | Admitting: Anesthesiology

## 2020-01-04 ENCOUNTER — Observation Stay (HOSPITAL_COMMUNITY)
Admission: RE | Admit: 2020-01-04 | Discharge: 2020-01-06 | Disposition: A | Discharge status: Home / Self Care | Payer: Medicare Other | Attending: Specialist | Admitting: Specialist

## 2020-01-04 ENCOUNTER — Encounter (HOSPITAL_COMMUNITY): Payer: Self-pay | Admitting: Specialist

## 2020-01-04 DIAGNOSIS — I10 Essential (primary) hypertension: Secondary | ICD-10-CM | POA: Insufficient documentation

## 2020-01-04 DIAGNOSIS — Z853 Personal history of malignant neoplasm of breast: Secondary | ICD-10-CM | POA: Diagnosis not present

## 2020-01-04 DIAGNOSIS — M1711 Unilateral primary osteoarthritis, right knee: Principal | ICD-10-CM | POA: Diagnosis present

## 2020-01-04 DIAGNOSIS — M25561 Pain in right knee: Secondary | ICD-10-CM | POA: Diagnosis present

## 2020-01-04 HISTORY — PX: TOTAL KNEE ARTHROPLASTY: SHX125

## 2020-01-04 LAB — ABO/RH: ABO/RH(D): A POS

## 2020-01-04 LAB — CREATININE, SERUM
Creatinine, Ser: 0.69 mg/dL (ref 0.44–1.00)
GFR calc Af Amer: >60 mL/min (ref >60)
GFR calc non Af Amer: >60 mL/min (ref >60)

## 2020-01-04 LAB — CBC
HCT: 39.9 % (ref 36.0–46.0)
Hemoglobin: 13.1 g/dL (ref 12.0–15.0)
MCH: 31.6 pg (ref 26.0–34.0)
MCHC: 32.8 g/dL (ref 30.0–36.0)
MCV: 96.1 fL (ref 80.0–100.0)
Platelets: 179 10*3/uL (ref 150–400)
RBC: 4.15 MIL/uL (ref 3.87–5.11)
RDW: 13.5 % (ref 11.5–15.5)
WBC: 13.9 10*3/uL — ABNORMAL HIGH (ref 4.0–10.5)
nRBC: 0 % (ref 0.0–0.2)

## 2020-01-04 LAB — TYPE AND SCREEN
ABO/RH(D): A POS
Antibody Screen: NEGATIVE

## 2020-01-04 LAB — PROTIME-INR
INR: 1.1 (ref 0.8–1.2)
Prothrombin Time: 13.4 seconds (ref 11.4–15.2)

## 2020-01-04 SURGERY — ARTHROPLASTY, KNEE, TOTAL
Anesthesia: Monitor Anesthesia Care | Site: Knee | Laterality: Right

## 2020-01-04 MED ORDER — FAMOTIDINE 20 MG PO TABS
20.0000 mg | ORAL_TABLET | Freq: Every day | ORAL | Status: DC
  Administered 2020-01-04 – 2020-01-05 (×2): 20 mg via ORAL
  Filled 2020-01-04 (×2): qty 1

## 2020-01-04 MED ORDER — ONDANSETRON HCL 4 MG/2ML IJ SOLN: 4.0000 mg | Freq: Once | INTRAMUSCULAR | Status: DC | PRN

## 2020-01-04 MED ORDER — BUPIVACAINE LIPOSOME 1.3 % IJ SUSP
20.0000 mL | Freq: Once | INTRAMUSCULAR | Status: DC
  Filled 2020-01-04: qty 20

## 2020-01-04 MED ORDER — FENTANYL CITRATE (PF) 100 MCG/2ML IJ SOLN
25.0000 ug | INTRAMUSCULAR | Status: DC | PRN
  Administered 2020-01-04: 50 ug via INTRAVENOUS

## 2020-01-04 MED ORDER — ACETAMINOPHEN 325 MG PO TABS: 325.0000 mg | ORAL_TABLET | ORAL | Status: DC | PRN

## 2020-01-04 MED ORDER — ESCITALOPRAM OXALATE 10 MG PO TABS
10.0000 mg | ORAL_TABLET | Freq: Every day | ORAL | Status: DC
  Administered 2020-01-05 – 2020-01-06 (×2): 10 mg via ORAL
  Filled 2020-01-04 (×2): qty 1

## 2020-01-04 MED ORDER — METHOCARBAMOL 500 MG IVPB - SIMPLE MED
INTRAVENOUS | Status: AC
  Filled 2020-01-04: qty 50

## 2020-01-04 MED ORDER — ACETAMINOPHEN 500 MG PO TABS
1000.0000 mg | ORAL_TABLET | Freq: Four times a day (QID) | ORAL | Status: AC
  Administered 2020-01-04 – 2020-01-05 (×4): 1000 mg via ORAL
  Filled 2020-01-04 (×4): qty 2

## 2020-01-04 MED ORDER — SODIUM CHLORIDE 0.9 % IV SOLN: INTRAVENOUS | Status: DC

## 2020-01-04 MED ORDER — IRRISEPT - 450ML BOTTLE WITH 0.05% CHG IN STERILE WATER, USP 99.95% OPTIME
TOPICAL | Status: DC | PRN
  Administered 2020-01-04: 450 mL via TOPICAL

## 2020-01-04 MED ORDER — 0.9 % SODIUM CHLORIDE (POUR BTL) OPTIME
TOPICAL | Status: DC | PRN
  Administered 2020-01-04: 1000 mL

## 2020-01-04 MED ORDER — PHENOBARBITAL 32.4 MG PO TABS
97.2000 mg | ORAL_TABLET | Freq: Every day | ORAL | Status: DC
  Administered 2020-01-04 – 2020-01-05 (×2): 97.2 mg via ORAL
  Filled 2020-01-04 (×2): qty 3

## 2020-01-04 MED ORDER — ONDANSETRON HCL 4 MG/2ML IJ SOLN: 4.0000 mg | Freq: Four times a day (QID) | INTRAMUSCULAR | Status: DC | PRN

## 2020-01-04 MED ORDER — STERILE WATER FOR IRRIGATION IR SOLN
Status: DC | PRN
  Administered 2020-01-04: 1000 mL

## 2020-01-04 MED ORDER — METHOCARBAMOL 500 MG IVPB - SIMPLE MED
500.0000 mg | Freq: Four times a day (QID) | INTRAVENOUS | Status: DC | PRN
  Administered 2020-01-04: 500 mg via INTRAVENOUS
  Filled 2020-01-04: qty 50

## 2020-01-04 MED ORDER — POVIDONE-IODINE 10 % EX SWAB
2.0000 "application " | Freq: Once | CUTANEOUS | Status: AC
  Administered 2020-01-04: 2 via TOPICAL

## 2020-01-04 MED ORDER — DEXAMETHASONE SODIUM PHOSPHATE 10 MG/ML IJ SOLN
INTRAMUSCULAR | Status: DC | PRN
  Administered 2020-01-04: 10 mg

## 2020-01-04 MED ORDER — ONDANSETRON HCL 4 MG/2ML IJ SOLN
INTRAMUSCULAR | Status: DC | PRN
  Administered 2020-01-04: 4 mg via INTRAVENOUS

## 2020-01-04 MED ORDER — ONDANSETRON HCL 4 MG/2ML IJ SOLN
INTRAMUSCULAR | Status: AC
  Filled 2020-01-04: qty 2

## 2020-01-04 MED ORDER — ACETAMINOPHEN 160 MG/5ML PO SOLN: 325.0000 mg | ORAL | Status: DC | PRN

## 2020-01-04 MED ORDER — OXYCODONE HCL 5 MG/5ML PO SOLN: 5.0000 mg | Freq: Once | ORAL | Status: DC | PRN

## 2020-01-04 MED ORDER — ONDANSETRON HCL 4 MG PO TABS: 4.0000 mg | ORAL_TABLET | Freq: Four times a day (QID) | ORAL | Status: DC | PRN

## 2020-01-04 MED ORDER — MIDAZOLAM HCL 5 MG/5ML IJ SOLN
INTRAMUSCULAR | Status: DC | PRN
  Administered 2020-01-04: .5 mg via INTRAVENOUS

## 2020-01-04 MED ORDER — FENTANYL CITRATE (PF) 100 MCG/2ML IJ SOLN
INTRAMUSCULAR | Status: AC
  Filled 2020-01-04: qty 2

## 2020-01-04 MED ORDER — DEXAMETHASONE SODIUM PHOSPHATE 10 MG/ML IJ SOLN
INTRAMUSCULAR | Status: AC
  Filled 2020-01-04: qty 1

## 2020-01-04 MED ORDER — FENTANYL CITRATE (PF) 100 MCG/2ML IJ SOLN
INTRAMUSCULAR | Status: DC | PRN
  Administered 2020-01-04: 25 ug via INTRAVENOUS
  Administered 2020-01-04: 50 ug via INTRAVENOUS

## 2020-01-04 MED ORDER — PHENYLEPHRINE HCL-NACL 20-0.9 MG/250ML-% IV SOLN
INTRAVENOUS | Status: DC | PRN
  Administered 2020-01-04: 20 ug/min via INTRAVENOUS

## 2020-01-04 MED ORDER — PANTOPRAZOLE SODIUM 40 MG PO TBEC
40.0000 mg | DELAYED_RELEASE_TABLET | Freq: Every day | ORAL | Status: DC
  Administered 2020-01-05 – 2020-01-06 (×2): 40 mg via ORAL
  Filled 2020-01-04 (×2): qty 1

## 2020-01-04 MED ORDER — AMLODIPINE BESYLATE 5 MG PO TABS
5.0000 mg | ORAL_TABLET | Freq: Every day | ORAL | Status: DC
  Administered 2020-01-05 – 2020-01-06 (×2): 5 mg via ORAL
  Filled 2020-01-04 (×2): qty 1

## 2020-01-04 MED ORDER — PHENYLEPHRINE 40 MCG/ML (10ML) SYRINGE FOR IV PUSH (FOR BLOOD PRESSURE SUPPORT)
PREFILLED_SYRINGE | INTRAVENOUS | Status: AC
  Filled 2020-01-04: qty 10

## 2020-01-04 MED ORDER — FLUTICASONE PROPIONATE 50 MCG/ACT NA SUSP
1.0000 | Freq: Every day | NASAL | Status: DC | PRN
  Filled 2020-01-04: qty 16

## 2020-01-04 MED ORDER — CEFAZOLIN SODIUM-DEXTROSE 2-4 GM/100ML-% IV SOLN
2.0000 g | INTRAVENOUS | Status: AC
  Administered 2020-01-04: 2 g via INTRAVENOUS
  Filled 2020-01-04: qty 100

## 2020-01-04 MED ORDER — ALBUTEROL SULFATE (2.5 MG/3ML) 0.083% IN NEBU: 2.5000 mg | INHALATION_SOLUTION | Freq: Four times a day (QID) | RESPIRATORY_TRACT | Status: DC | PRN

## 2020-01-04 MED ORDER — HYDROMORPHONE HCL 1 MG/ML IJ SOLN: 0.5000 mg | INTRAMUSCULAR | Status: DC | PRN

## 2020-01-04 MED ORDER — SODIUM CHLORIDE 0.9 % IR SOLN
Status: DC | PRN
  Administered 2020-01-04: 3000 mL

## 2020-01-04 MED ORDER — OXYCODONE HCL 5 MG PO TABS
10.0000 mg | ORAL_TABLET | ORAL | Status: DC | PRN
  Administered 2020-01-05 – 2020-01-06 (×2): 10 mg via ORAL

## 2020-01-04 MED ORDER — SENNOSIDES-DOCUSATE SODIUM 8.6-50 MG PO TABS: 1.0000 | ORAL_TABLET | Freq: Every evening | ORAL | Status: DC | PRN

## 2020-01-04 MED ORDER — CEFAZOLIN SODIUM-DEXTROSE 1-4 GM/50ML-% IV SOLN
1.0000 g | Freq: Four times a day (QID) | INTRAVENOUS | Status: AC
  Administered 2020-01-04 – 2020-01-05 (×3): 1 g via INTRAVENOUS
  Filled 2020-01-04 (×3): qty 50

## 2020-01-04 MED ORDER — LACTATED RINGERS IV SOLN: INTRAVENOUS | Status: DC

## 2020-01-04 MED ORDER — SODIUM CHLORIDE 0.9 % IV SOLN: INTRAVENOUS | Status: DC | PRN

## 2020-01-04 MED ORDER — GABAPENTIN 300 MG PO CAPS
300.0000 mg | ORAL_CAPSULE | Freq: Three times a day (TID) | ORAL | Status: DC
  Administered 2020-01-04 – 2020-01-06 (×6): 300 mg via ORAL
  Filled 2020-01-04 (×6): qty 1

## 2020-01-04 MED ORDER — ACETAMINOPHEN 325 MG PO TABS: 325.0000 mg | ORAL_TABLET | Freq: Four times a day (QID) | ORAL | Status: DC | PRN

## 2020-01-04 MED ORDER — METHOCARBAMOL 500 MG PO TABS
500.0000 mg | ORAL_TABLET | Freq: Four times a day (QID) | ORAL | Status: DC | PRN
  Administered 2020-01-05 – 2020-01-06 (×3): 500 mg via ORAL
  Filled 2020-01-04 (×3): qty 1

## 2020-01-04 MED ORDER — OXYCODONE HCL 5 MG PO TABS
5.0000 mg | ORAL_TABLET | ORAL | Status: DC | PRN
  Administered 2020-01-04: 5 mg via ORAL
  Administered 2020-01-05: 10 mg via ORAL
  Administered 2020-01-05 – 2020-01-06 (×2): 5 mg via ORAL
  Filled 2020-01-04 (×2): qty 2
  Filled 2020-01-04 (×3): qty 1
  Filled 2020-01-04: qty 2

## 2020-01-04 MED ORDER — MIDAZOLAM HCL 2 MG/2ML IJ SOLN
INTRAMUSCULAR | Status: AC
  Filled 2020-01-04: qty 2

## 2020-01-04 MED ORDER — OXYCODONE HCL 5 MG PO TABS: 5.0000 mg | ORAL_TABLET | Freq: Once | ORAL | Status: DC | PRN

## 2020-01-04 MED ORDER — PREDNISONE 5 MG PO TABS
5.0000 mg | ORAL_TABLET | Freq: Every day | ORAL | Status: DC
  Administered 2020-01-05 – 2020-01-06 (×2): 5 mg via ORAL
  Filled 2020-01-04 (×2): qty 1

## 2020-01-04 MED ORDER — SODIUM CHLORIDE (PF) 0.9 % IJ SOLN
INTRAMUSCULAR | Status: AC
  Filled 2020-01-04: qty 50

## 2020-01-04 MED ORDER — DIPHENHYDRAMINE HCL 12.5 MG/5ML PO ELIX: 12.5000 mg | ORAL_SOLUTION | ORAL | Status: DC | PRN

## 2020-01-04 MED ORDER — ROPIVACAINE HCL 7.5 MG/ML IJ SOLN
INTRAMUSCULAR | Status: DC | PRN
  Administered 2020-01-04: 25 mL via PERINEURAL

## 2020-01-04 MED ORDER — SODIUM CHLORIDE (PF) 0.9 % IJ SOLN
INTRAMUSCULAR | Status: AC
  Filled 2020-01-04: qty 10

## 2020-01-04 MED ORDER — BUPIVACAINE IN DEXTROSE 0.75-8.25 % IT SOLN
INTRATHECAL | Status: DC | PRN
  Administered 2020-01-04: 1.5 mL via INTRATHECAL

## 2020-01-04 MED ORDER — DEXAMETHASONE SODIUM PHOSPHATE 10 MG/ML IJ SOLN
8.0000 mg | Freq: Once | INTRAMUSCULAR | Status: AC
  Administered 2020-01-04: 8 mg via INTRAVENOUS

## 2020-01-04 MED ORDER — ALBUTEROL SULFATE HFA 108 (90 BASE) MCG/ACT IN AERS: 1.0000 | INHALATION_SPRAY | Freq: Four times a day (QID) | RESPIRATORY_TRACT | Status: DC | PRN

## 2020-01-04 MED ORDER — TRANEXAMIC ACID-NACL 1000-0.7 MG/100ML-% IV SOLN
1000.0000 mg | INTRAVENOUS | Status: AC
  Administered 2020-01-04: 1000 mg via INTRAVENOUS
  Filled 2020-01-04: qty 100

## 2020-01-04 MED ORDER — EZETIMIBE 10 MG PO TABS
10.0000 mg | ORAL_TABLET | Freq: Every day | ORAL | Status: DC
  Administered 2020-01-05 – 2020-01-06 (×2): 10 mg via ORAL
  Filled 2020-01-04 (×2): qty 1

## 2020-01-04 MED ORDER — MEPERIDINE HCL 50 MG/ML IJ SOLN: 6.2500 mg | INTRAMUSCULAR | Status: DC | PRN

## 2020-01-04 MED ORDER — PHENYLEPHRINE 40 MCG/ML (10ML) SYRINGE FOR IV PUSH (FOR BLOOD PRESSURE SUPPORT)
PREFILLED_SYRINGE | INTRAVENOUS | Status: DC | PRN
  Administered 2020-01-04: 80 ug via INTRAVENOUS

## 2020-01-04 MED ORDER — SODIUM CHLORIDE (PF) 0.9 % IJ SOLN
INTRAMUSCULAR | Status: DC | PRN
  Administered 2020-01-04: 60 mL

## 2020-01-04 MED ORDER — PHENYLEPHRINE HCL (PRESSORS) 10 MG/ML IV SOLN
INTRAVENOUS | Status: AC
  Filled 2020-01-04: qty 2

## 2020-01-04 MED ORDER — PROPOFOL 1000 MG/100ML IV EMUL
INTRAVENOUS | Status: AC
  Filled 2020-01-04: qty 100

## 2020-01-04 MED ORDER — CHLORHEXIDINE GLUCONATE 0.12 % MT SOLN
15.0000 mL | Freq: Once | OROMUCOSAL | Status: AC
  Administered 2020-01-04: 15 mL via OROMUCOSAL

## 2020-01-04 MED ORDER — HYDROCHLOROTHIAZIDE 25 MG PO TABS
12.5000 mg | ORAL_TABLET | Freq: Every day | ORAL | Status: DC
  Administered 2020-01-05 – 2020-01-06 (×2): 12.5 mg via ORAL
  Filled 2020-01-04 (×2): qty 1

## 2020-01-04 MED ORDER — PROPOFOL 500 MG/50ML IV EMUL
INTRAVENOUS | Status: DC | PRN
  Administered 2020-01-04: 40 ug/kg/min via INTRAVENOUS

## 2020-01-04 MED ORDER — TRAMADOL HCL 50 MG PO TABS
50.0000 mg | ORAL_TABLET | Freq: Four times a day (QID) | ORAL | Status: DC
  Administered 2020-01-04 – 2020-01-06 (×6): 50 mg via ORAL
  Filled 2020-01-04 (×6): qty 1

## 2020-01-04 MED ORDER — FAMOTIDINE 20 MG PO TABS: 40.0000 mg | ORAL_TABLET | Freq: Every day | ORAL | Status: DC

## 2020-01-04 MED ORDER — ENOXAPARIN SODIUM 40 MG/0.4ML ~~LOC~~ SOLN
40.0000 mg | SUBCUTANEOUS | Status: DC
  Administered 2020-01-05 – 2020-01-06 (×2): 40 mg via SUBCUTANEOUS
  Filled 2020-01-04 (×2): qty 0.4

## 2020-01-04 MED ORDER — MAGNESIUM CITRATE PO SOLN: 1.0000 | Freq: Once | ORAL | Status: DC | PRN

## 2020-01-04 MED ORDER — ORAL CARE MOUTH RINSE: 15.0000 mL | Freq: Once | OROMUCOSAL | Status: AC

## 2020-01-04 MED ORDER — BISACODYL 5 MG PO TBEC: 5.0000 mg | DELAYED_RELEASE_TABLET | Freq: Every day | ORAL | Status: DC | PRN

## 2020-01-04 SURGICAL SUPPLY — 67 items
ATTUNE MED DOME PAT 32 KNEE (Knees) ×2 IMPLANT
ATTUNE MED DOME PAT 32MM KNEE (Knees) ×1 IMPLANT
ATTUNE PSFEM RTSZ5 NARCEM KNEE (Femur) ×3 IMPLANT
ATTUNE PSRP INSR SZ5 6 KNEE (Insert) ×2 IMPLANT
ATTUNE PSRP INSR SZ5 6MM KNEE (Insert) ×1 IMPLANT
BAG ZIPLOCK 12X15 (MISCELLANEOUS) ×3 IMPLANT
BASEPLATE TIBIAL ROTATING SZ 4 (Knees) ×3 IMPLANT
BLADE SAG 18X100X1.27 (BLADE) ×3 IMPLANT
BLADE SAW SGTL 11.0X1.19X90.0M (BLADE) ×3 IMPLANT
BNDG ELASTIC 4X5.8 VLCR STR LF (GAUZE/BANDAGES/DRESSINGS) ×3 IMPLANT
BNDG ELASTIC 6X10 VLCR STRL LF (GAUZE/BANDAGES/DRESSINGS) ×3 IMPLANT
BNDG ELASTIC 6X5.8 VLCR STR LF (GAUZE/BANDAGES/DRESSINGS) ×3 IMPLANT
BOWL SMART MIX CTS (DISPOSABLE) ×3 IMPLANT
CEMENT HV SMART SET (Cement) ×6 IMPLANT
COVER SURGICAL LIGHT HANDLE (MISCELLANEOUS) ×3 IMPLANT
COVER WAND RF STERILE (DRAPES) ×3 IMPLANT
CUFF TOURN SGL QUICK 34 (TOURNIQUET CUFF) ×3
CUFF TRNQT CYL 34X4.125X (TOURNIQUET CUFF) ×1 IMPLANT
DECANTER SPIKE VIAL GLASS SM (MISCELLANEOUS) ×3 IMPLANT
DERMABOND ADVANCED (GAUZE/BANDAGES/DRESSINGS) ×2
DERMABOND ADVANCED .7 DNX12 (GAUZE/BANDAGES/DRESSINGS) ×1 IMPLANT
DRAPE U-SHAPE 47X51 STRL (DRAPES) ×3 IMPLANT
DRSG AQUACEL AG ADV 3.5X10 (GAUZE/BANDAGES/DRESSINGS) ×3 IMPLANT
DRSG OPSITE POSTOP 4X8 (GAUZE/BANDAGES/DRESSINGS) ×3 IMPLANT
DRSG TEGADERM 4X4.75 (GAUZE/BANDAGES/DRESSINGS) ×3 IMPLANT
DURAPREP 26ML APPLICATOR (WOUND CARE) ×6 IMPLANT
ELECT REM PT RETURN 15FT ADLT (MISCELLANEOUS) ×3 IMPLANT
EVACUATOR 1/8 PVC DRAIN (DRAIN) ×3 IMPLANT
GAUZE SPONGE 2X2 8PLY STRL LF (GAUZE/BANDAGES/DRESSINGS) ×1 IMPLANT
GLOVE BIOGEL PI IND STRL 7.5 (GLOVE) ×1 IMPLANT
GLOVE BIOGEL PI IND STRL 8 (GLOVE) ×1 IMPLANT
GLOVE BIOGEL PI INDICATOR 7.5 (GLOVE) ×2
GLOVE BIOGEL PI INDICATOR 8 (GLOVE) ×2
GLOVE ECLIPSE 8.0 STRL XLNG CF (GLOVE) ×3 IMPLANT
GLOVE SURG ORTHO 9.0 STRL STRW (GLOVE) ×3 IMPLANT
GLOVE SURG SS PI 7.0 STRL IVOR (GLOVE) ×3 IMPLANT
GOWN STRL REUS W/TWL XL LVL3 (GOWN DISPOSABLE) ×6 IMPLANT
HANDPIECE INTERPULSE COAX TIP (DISPOSABLE) ×3
JET LAVAGE IRRISEPT WOUND (IRRIGATION / IRRIGATOR) ×3
KIT TURNOVER KIT A (KITS) ×3 IMPLANT
LAVAGE JET IRRISEPT WOUND (IRRIGATION / IRRIGATOR) ×1 IMPLANT
NS IRRIG 1000ML POUR BTL (IV SOLUTION) ×3 IMPLANT
PACK TOTAL KNEE CUSTOM (KITS) ×3 IMPLANT
PENCIL SMOKE EVACUATOR (MISCELLANEOUS) IMPLANT
PIN DRILL FIX HALF THREAD (BIT) ×3 IMPLANT
PIN STEINMAN FIXATION KNEE (PIN) ×3 IMPLANT
PROTECTOR NERVE ULNAR (MISCELLANEOUS) ×3 IMPLANT
SET HNDPC FAN SPRY TIP SCT (DISPOSABLE) ×1 IMPLANT
SET PAD KNEE POSITIONER (MISCELLANEOUS) ×3 IMPLANT
SPONGE GAUZE 2X2 STER 10/PKG (GAUZE/BANDAGES/DRESSINGS) ×2
SPONGE LAP 18X18 RF (DISPOSABLE) IMPLANT
SPONGE SURGIFOAM ABS GEL 100 (HEMOSTASIS) ×3 IMPLANT
STOCKINETTE 6  STRL (DRAPES) ×3
STOCKINETTE 6 STRL (DRAPES) ×1 IMPLANT
SUT BONE WAX W31G (SUTURE) IMPLANT
SUT MNCRL AB 3-0 PS2 18 (SUTURE) ×3 IMPLANT
SUT VIC AB 1 CT1 27 (SUTURE) ×9
SUT VIC AB 1 CT1 27XBRD ANTBC (SUTURE) ×3 IMPLANT
SUT VIC AB 2-0 CT1 27 (SUTURE) ×6
SUT VIC AB 2-0 CT1 TAPERPNT 27 (SUTURE) ×2 IMPLANT
SUT VLOC 180 0 24IN GS25 (SUTURE) ×3 IMPLANT
SYR 50ML LL SCALE MARK (SYRINGE) ×3 IMPLANT
TAPE STRIPS DRAPE STRL (GAUZE/BANDAGES/DRESSINGS) ×3 IMPLANT
TRAY CATH 16FR W/PLASTIC CATH (SET/KITS/TRAYS/PACK) ×3 IMPLANT
WATER STERILE IRR 1000ML POUR (IV SOLUTION) ×6 IMPLANT
WRAP KNEE MAXI GEL POST OP (GAUZE/BANDAGES/DRESSINGS) ×3 IMPLANT
YANKAUER SUCT BULB TIP 10FT TU (MISCELLANEOUS) ×3 IMPLANT

## 2020-01-04 NOTE — H&P (Signed)
TOTAL KNEE ADMISSION H&P  Patient is being admitted for right total knee arthroplasty.  Subjective:  Chief Complaint:right knee pain.  HPI: [name], 86 y.o. female, has a history of pain and functional disability in the right knee due to arthritis and has failed non-surgical conservative treatments for greater than 12 weeks to includeNSAID's and/or analgesics, corticosteriod injections, viscosupplementation injections, flexibility and strengthening excercises, supervised PT with diminished ADL's post treatment and activity modification.  Onset of symptoms was gradual, starting 4 years ago with gradually worsening course since that time. The patient noted no past surgery on the right knee(s).  Patient currently rates pain in the right knee(s) at 5 out of 10 with activity. Patient has night pain, worsening of pain with activity and weight bearing, pain that interferes with activities of daily living, pain with passive range of motion and crepitus.  Patient has evidence of periarticular osteophytes and joint space narrowing by imaging studies. This patient has had no previous injury. There is no active infection.  There are no problems to display for this patient.  Past Medical History:  Diagnosis Date  . Arthritis   . Cancer ([place])    breast Ca. on 2013  . GERD (gastroesophageal reflux disease)   . Hypertension   . Pneumonia   . Polymyalgia rheumatica ([place])   . Seizures ([place])     Past Surgical History:  Procedure Laterality Date  . ABDOMINAL HYSTERECTOMY    . BREAST SURGERY    . TONSILLECTOMY      Current Facility-Administered Medications  Medication Dose Route Frequency Provider Last Rate Last Admin  . bupivacaine liposome (EXPAREL) 1.3 % injection 266 mg  20 mL Other Once Lynde Ludwig R, PA      . ceFAZolin (ANCEF) IVPB 2g/100 mL premix  2 g Intravenous On Call to OR Gwenivere Hiraldo R, PA      . dexamethasone (DECADRON) injection 8 mg  8 mg Intravenous Once Myking Sar R,  PA      . lactated ringers infusion   Intravenous Continuous Drue Novel, PA 75 mL/hr at 01/04/20 6222 Continued from Pre-op at 01/04/20 0634  . lactated ringers infusion   Intravenous Continuous Nolon Nations, MD      . tranexamic acid (CYKLOKAPRON) IVPB 1,000 mg  1,000 mg Intravenous To OR Morayma Godown R, PA       Facility-Administered Medications Ordered in Other Encounters  Medication Dose Route Frequency Provider Last Rate Last Admin  . dexamethasone (DECADRON) injection   Infiltration Anesthesia Stephannie Li, MD   10 mg at 01/04/20 [phone]  . fentaNYL (SUBLIMAZE) injection   Intravenous Anesthesia Intra-op Victoriano Lain, CRNA   50 mcg at 01/04/20 0646  . midazolam (VERSED) 5 MG/5ML injection   Intravenous Anesthesia Intra-op Armistead, Courtney Heys, CRNA   0.5 mg at 01/04/20 0646  . ropivacaine (PF) 7.5 mg/mL (0.75%) (NAROPIN) injection   Peri-NEURAL Anesthesia Intra-op Janeece Riggers, MD   25 mL at 01/04/20 9211   Allergies  Allergen Reactions  . Sulfa Antibiotics Nausea Only  . Penicillins Swelling  . Simvastatin Swelling    Social History   Tobacco Use  . Smoking status: Never Smoker  . Smokeless tobacco: Never Used  Substance Use Topics  . Alcohol use: Never    History reviewed. No pertinent family history.   Review of Systems  Constitutional: Negative.   HENT: Negative.   Eyes: Negative.   Respiratory: Negative.   Cardiovascular: Negative.   Gastrointestinal: Negative.  Endocrine: Negative.   Genitourinary: Negative.   Musculoskeletal: Positive for arthralgias, joint swelling and myalgias.  Skin: Negative.   Allergic/Immunologic: Negative.   Neurological: Negative.   Hematological: Negative.   Psychiatric/Behavioral: Negative.   All other systems reviewed and are negative.   Objective:  Physical Exam Vitals reviewed.  Constitutional:      General: She is not in acute distress.    Appearance: She is normal weight. She is not ill-appearing,  toxic-appearing or diaphoretic.  HENT:     Head: Normocephalic and atraumatic.     Nose: Nose normal.     Mouth/Throat:     Mouth: Mucous membranes are moist.  Eyes:     Extraocular Movements: Extraocular movements intact.     Conjunctiva/sclera: Conjunctivae normal.     Pupils: Pupils are equal, round, and reactive to light.  Cardiovascular:     Rate and Rhythm: Normal rate and regular rhythm.     Pulses: Normal pulses.     Heart sounds: Normal heart sounds. No murmur heard.  No friction rub. No gallop.   Pulmonary:     Effort: Pulmonary effort is normal. No respiratory distress.     Breath sounds: Normal breath sounds. No stridor. No wheezing, rhonchi or rales.  Chest:     Chest wall: No tenderness.  Musculoskeletal:     Cervical back: Normal range of motion and neck supple.     Comments: Tenderness with palpation over medial and lateral joint line Pain with ROM  And weight bearing NVI in right lower extremity  Skin:    General: Skin is warm and dry.     Capillary Refill: Capillary refill takes less than 2 seconds.  Neurological:     General: No focal deficit present.     Mental Status: She is alert and oriented to person, place, and time. Mental status is at baseline.  Psychiatric:        Mood and Affect: Mood normal.        Behavior: Behavior normal.        Thought Content: Thought content normal.        Judgment: Judgment normal.     Vital signs in last 24 hours: Temp:  [98.7 F (37.1 C)] 98.7 F (37.1 C) (07/30 0613) Pulse Rate:  [83] 83 (07/30 0613) Resp:  [16] 16 (07/30 0613) BP: (151)/(47) 151/47 (07/30 0613) SpO2:  [97 %] 97 % (07/30 0973) Weight:  [65.8 kg] 65.8 kg (07/30 0613)  Labs:   Estimated body mass index is 26.52 kg/m as calculated from the following:   Height as of this encounter: 5\' 2"  (1.575 m).   Weight as of this encounter: 65.8 kg.   Imaging Review Plain radiographs demonstrate moderate degenerative joint disease of the right  knee(s). The overall alignment ismild varus. The bone quality appears to be good for age and reported activity level.      Assessment/Plan:  End stage arthritis, right knee   The patient history, physical examination, clinical judgment of the provider and imaging studies are consistent with end stage degenerative joint disease of the right knee(s) and total knee arthroplasty is deemed medically necessary. The treatment options including medical management, injection therapy arthroscopy and arthroplasty were discussed at length. The risks and benefits of total knee arthroplasty were presented and reviewed. The risks due to aseptic loosening, infection, stiffness, patella tracking problems, thromboembolic complications and other imponderables were discussed. The patient acknowledged the explanation, agreed to proceed with the plan and consent was signed.  Patient is being admitted for inpatient treatment for surgery, pain control, PT, OT, prophylactic antibiotics, VTE prophylaxis, progressive ambulation and ADL's and discharge planning. The patient is planning to be discharged home with home health services     Patient's anticipated LOS is less than 2 midnights, meeting these requirements: - Younger than 62 - Lives within 1 hour of care - Has a competent adult at home to recover with post-op recover - NO history of  - Chronic pain requiring opiods  - Diabetes  - Coronary Artery Disease  - Heart failure  - Heart attack  - Stroke  - DVT/VTE  - Cardiac arrhythmia  - Respiratory Failure/COPD  - Renal failure  - Anemia  - Advanced Liver disease

## 2020-01-04 NOTE — Progress Notes (Signed)
Discussed plan of care with pt's daughter. She reports pt will not have anyone to stay with her for 2-3 weeks and she lives alone. Daughter wondering if short term SNF would be an option.

## 2020-01-04 NOTE — Anesthesia Postprocedure Evaluation (Signed)
Anesthesia Post Note  Patient: [name]  Procedure(s) Performed: TOTAL KNEE ARTHROPLASTY (Right Knee)     Patient location during evaluation: PACU Anesthesia Type: MAC and Spinal Level of consciousness: oriented and awake and alert Pain management: pain level controlled Vital Signs Assessment: post-procedure vital signs reviewed and stable Respiratory status: spontaneous breathing, respiratory function stable and patient connected to nasal cannula oxygen Cardiovascular status: blood pressure returned to baseline and stable Postop Assessment: no headache, no backache and no apparent nausea or vomiting Anesthetic complications: no   No complications documented.  Last Vitals:  Vitals:   01/04/20 1115 01/04/20 1122  BP: (!) 100/51 116/66  Pulse: 72 73  Resp: 12 12  Temp: 36.6 C   SpO2: 96% 100%    Last Pain:  Vitals:   01/04/20 1115  TempSrc:   PainSc: 4                  Chrisann Melaragno

## 2020-01-04 NOTE — Anesthesia Procedure Notes (Signed)
Anesthesia Regional Block: Adductor canal block   Pre-Anesthetic Checklist: ,, timeout performed, Correct Patient, Correct Site, Correct Laterality, Correct Procedure, Correct Position, site marked, Risks and benefits discussed,  Surgical consent,  Pre-op evaluation,  At surgeon's request and post-op pain management  Laterality: Right  Prep: chloraprep       Needles:  Injection technique: Single-shot  Needle Type: Echogenic Stimulator Needle     Needle Length: 5cm  Needle Gauge: 22     Additional Needles:   Procedures:, nerve stimulator,,, ultrasound used (permanent image in chart),,,,   Nerve Stimulator or Paresthesia:  Response: quadraceps contraction, 0.45 mA,   Additional Responses:   Narrative:  Start time: 01/04/2020 6:45 AM End time: 01/04/2020 6:50 AM Injection made incrementally with aspirations every 5 mL.  Performed by: Personally  Anesthesiologist: Janeece Riggers, MD  Additional Notes: Functioning IV was confirmed and monitors were applied.  A 64mm 22ga Arrow echogenic stimulator needle was used. Sterile prep and drape,hand hygiene and sterile gloves were used. Ultrasound guidance: relevant anatomy identified, needle position confirmed, local anesthetic spread visualized around nerve(s)., vascular puncture avoided.  Image printed for medical record. Negative aspiration and negative test dose prior to incremental administration of local anesthetic. The patient tolerated the procedure well.

## 2020-01-04 NOTE — Anesthesia Procedure Notes (Signed)
Spinal  Patient location during procedure: OR Start time: 01/04/2020 7:49 AM End time: 01/04/2020 7:52 AM Staffing Anesthesiologist: Janeece Riggers, MD Preanesthetic Checklist Completed: patient identified, IV checked, site marked, risks and benefits discussed, surgical consent, monitors and equipment checked, pre-op evaluation and timeout performed Spinal Block Patient position: sitting Prep: DuraPrep Patient monitoring: heart rate, cardiac monitor, continuous pulse ox and blood pressure Approach: midline Location: L2-3 Injection technique: single-shot Needle Needle type: Sprotte  Needle gauge: 24 G Needle length: 9 cm Assessment Sensory level: T4

## 2020-01-04 NOTE — Evaluation (Addendum)
Physical Therapy Evaluation Patient Details Name: [name] MRN: [id number] DOB: 06/20/1929 Today's Date: 01/04/2020   History of Present Illness  Patient is 86 y.o. female s/p Rt TKA on 01/04/20 with PMH significant for seizures, HTN, GERD, breast cancer (2013), OA, polymyalgia rheumatica.    Clinical Impression  CARRIANNE HYUN is a 86 y.o. female POD 0 s/p Rt TKA. Patient reports independence with mobility at baseline. Patient is now limited by functional impairments (see PT problem list below) and requires min assist for transfers and gait with RW. Patient was able to ambulate ~100 feet with RW and min assist. Patient instructed in exercise to facilitate circulation. Patient will benefit from continued skilled PT interventions to address impairments and progress towards PLOF. Acute PT will follow to progress mobility and stair training in preparation for safe discharge.     Follow Up Recommendations Follow surgeon's recommendation for DC plan and follow-up therapies;SNF    Equipment Recommendations  Rolling walker with 5" wheels (youth)    Recommendations for Other Services       Precautions / Restrictions Precautions Precautions: Fall Precaution Comments: reports 1 fall back in december while working in her kitchen. Restrictions Weight Bearing Restrictions: No Other Position/Activity Restrictions: WBAT      Mobility  Bed Mobility Overal bed mobility: Needs Assistance Bed Mobility: Supine to Sit     Supine to sit: Min assist;HOB elevated     General bed mobility comments: VCs for use of bed rail and assist to bring LE's off EOB and raise trunk.  Transfers Overall transfer level: Needs assistance Equipment used: Rolling walker (2 wheeled) Transfers: Sit to/from Stand Sit to Stand: Min assist;Mod assist         General transfer comment: VC's for technique with RW, min-mod assist to initiate power up and complete rise  Ambulation/Gait Ambulation/Gait  assistance: Min assist Gait Distance (Feet): 100 Feet Assistive device: Rolling walker (2 wheeled) Gait Pattern/deviations: Step-to pattern;Decreased stride length;Decreased stance time - right;Decreased weight shift to right Gait velocity: decr   General Gait Details: VCs for safe step pattern and proximity to RW, assist required throughout to position walker as pt tended to move it too far forward. no overt LOB noted or buckling at Rt knee.  Stairs            Wheelchair Mobility    Modified Rankin (Stroke Patients Only)       Balance Overall balance assessment: Needs assistance Sitting-balance support: Feet supported Sitting balance-Leahy Scale: Good     Standing balance support: During functional activity;Bilateral upper extremity supported Standing balance-Leahy Scale: Fair              Pertinent Vitals/Pain Pain Assessment: 0-10 Pain Score: 2  Pain Location: Rt knee Pain Descriptors / Indicators: Aching;Discomfort;Sore Pain Intervention(s): Limited activity within patient's tolerance;Monitored during session;Repositioned;Ice applied    Home Living Family/patient expects to be discharged to:: Private residence Living Arrangements: Alone Available Help at Discharge: Family Type of Home: House Home Access: [place]: One [place]: Environmental consultant - 2 wheels;Cane - single point;Walker - 4 wheels;Bedside commode;Shower seat (walker was her husbands) Additional Comments: family reports they cannot provide 24/7 assist and concerned about being alone for first 2-3 weeks.    Prior Function Level of Independence: Independent         Comments: using RW or cane if outside but not in home     Hand Dominance   Dominant Hand: Right  Extremity/Trunk Assessment   Upper Extremity Assessment Upper Extremity Assessment: Overall WFL for tasks assessed    Lower Extremity Assessment Lower Extremity Assessment: RLE  deficits/detail RLE Deficits / Details: good quad activation, no extensor lag with SLR RLE Sensation: WNL RLE Coordination: WNL    Cervical / Trunk Assessment Cervical / Trunk Assessment: Kyphotic  Communication   Communication: No difficulties  Cognition Arousal/Alertness: Awake/alert Behavior During Therapy: WFL for tasks assessed/performed Overall Cognitive Status: Within Functional Limits for tasks assessed               General Comments      Exercises Total Joint Exercises Ankle Circles/Pumps: Both;20 reps;AROM;Seated   Assessment/Plan    PT Assessment Patient needs continued PT services  PT Problem List Decreased range of motion;Decreased strength;Decreased balance;Decreased activity tolerance;Decreased mobility;Decreased knowledge of use of DME;Decreased safety awareness;Decreased knowledge of precautions       PT Treatment Interventions DME instruction;Gait training;Stair training;Functional mobility training;Therapeutic activities;Therapeutic exercise;Balance training;Patient/family education    PT Goals (Current goals can be found in the Care Plan section)  Acute Rehab PT Goals Patient Stated Goal: get back to independence in her home PT Goal Formulation: With patient Time For Goal Achievement: 01/11/20 Potential to Achieve Goals: Good    Frequency 7X/week   Barriers to discharge Decreased caregiver support pt is interested in rehab at SNF initially       AM-PAC PT "6 Clicks" Mobility  Outcome Measure Help needed turning from your back to your side while in a flat bed without using bedrails?: A Little Help needed moving from lying on your back to sitting on the side of a flat bed without using bedrails?: A Little Help needed moving to and from a bed to a chair (including a wheelchair)?: A Little Help needed standing up from a chair using your arms (e.g., wheelchair or bedside chair)?: A Little Help needed to walk in hospital room?: A Little Help  needed climbing 3-5 steps with a railing? : A Lot 6 Click Score: 17    End of Session Equipment Utilized During Treatment: Gait belt Activity Tolerance: Patient tolerated treatment well Patient left: in chair;with call bell/phone within reach;with chair alarm set;with family/visitor present Nurse Communication: Mobility status PT Visit Diagnosis: Muscle weakness (generalized) (M62.81);Difficulty in walking, not elsewhere classified (R26.2)    Time: 0092-3300 PT Time Calculation (min) (ACUTE ONLY): 32 min   Charges:   PT Evaluation $PT Eval Low Complexity: 1 Low PT Treatments $Gait Training: 8-22 mins      Verner Mould, DPT Acute Rehabilitation Services  Office [phone] Pager [phone]  01/04/2020 4:31 PM

## 2020-01-04 NOTE — Transfer of Care (Signed)
Immediate Anesthesia Transfer of Care Note  Patient: [name]  Procedure(s) Performed: TOTAL KNEE ARTHROPLASTY (Right Knee)  Patient Location: PACU  Anesthesia Type:MAC and Spinal  Level of Consciousness: awake, alert , oriented and patient cooperative  Airway & Oxygen Therapy: Patient Spontanous Breathing and Patient connected to face mask oxygen  Post-op Assessment: Report given to RN and Post -op Vital signs reviewed and stable  Post vital signs: Reviewed and stable  Last Vitals:  Vitals Value Taken Time  BP 111/56 01/04/20 1002  Temp    Pulse 76 01/04/20 1003  Resp 23 01/04/20 1003  SpO2 96 % 01/04/20 1003  Vitals shown include unvalidated device data.  Last Pain:  Vitals:   01/04/20 0613  TempSrc: Oral      Patients Stated Pain Goal: 4 (36/14/43 1540)  Complications: No complications documented.

## 2020-01-04 NOTE — Interval H&P Note (Signed)
History and Physical Interval Note:  01/04/2020 8:00 AM  [name]  has presented today for surgery, with the diagnosis of Right knee osteoarthritis.  The various methods of treatment have been discussed with the patient and family. After consideration of risks, benefits and other options for treatment, the patient has consented to  Procedure(s) with comments: TOTAL KNEE ARTHROPLASTY (Right) - adductor canal as a surgical intervention.  The patient's history has been reviewed, patient examined, no change in status, stable for surgery.  I have reviewed the patient's chart and labs.  Questions were answered to the patient's satisfaction.     [name] ANDREW

## 2020-01-04 NOTE — Op Note (Signed)
DATE OF SURGERY:  01/04/2020  TIME: 9:42 AM  PATIENT NAME:  [name]    AGE: 86 y.o.   PRE-OPERATIVE DIAGNOSIS:  Right knee osteoarthritis  POST-OPERATIVE DIAGNOSIS:  Right knee osteoarthritis  PROCEDURE:  Procedure(s): TOTAL KNEE ARTHROPLASTY  SURGEON:  Anysha Frappier ANDREW  ASSISTANT:  Leeanne Haus, PA-C, present and scrubbed throughout the case, critical for assistance with exposure, retraction, instrumentation, and closure.  OPERATIVE IMPLANTS: Depuy PFC Attune Rotating Platform.  Femur size 5, Tibia size 4, Patella size 32 3-peg oval button, with a 6 mm polyethylene insert.   PREOPERATIVE INDICATIONS:   [name] is a 86 y.o. year old female with end stage bone on bone arthritis of the knee who failed conservative treatment and elected for Total Knee Arthroplasty.   The risks, benefits, and alternatives were discussed at length including but not limited to the risks of infection, bleeding, nerve injury, stiffness, blood clots, the need for revision surgery, cardiopulmonary complications, among others, and they were willing to proceed.  OPERATIVE DESCRIPTION:  The patient was brought to the operative room and placed in a supine position.  Spinal anesthesia was administered.  IV antibiotics were given.  The lower extremity was prepped and draped in the usual sterile fashion.  Time out was performed.  The leg was elevated and exsanguinated and the tourniquet was inflated.  Anterior quadriceps tendon splitting approach was performed.  The patella was retracted and osteophytes were removed.  The anterior horn of the medial and lateral meniscus was removed and cruciate ligaments resected.   The distal femur was opened with the drill and the intramedullary distal femoral cutting jig was utilized, set at 5 degrees resecting 10 mm off the distal femur.  Care was taken to protect the collateral ligaments.  The distal femoral sizing jig was applied, taking care to  avoid notching.  Then the 4-in-1 cutting jig was applied and the anterior and posterior femur was cut, along with the chamfer cuts.    Then the extramedullary tibial cutting jig was utilized making the appropriate cut using the anterior tibial crest as a reference building in appropriate posterior slope.  Care was taken during the cut to protect the medial and collateral ligaments.  The proximal tibia was removed along with the posterior horns of the menisci.   The posterior medial femoral osteophytes and posterior lateral femoral osteophytes were removed.    The flexion gap was then measured and was symmetric with the extension gap, measured at 6.  I completed the distal femoral preparation using the appropriate jig to prepare the box.  The patella was then measured, and cut with the saw.    The proximal tibia sized and prepared accordingly with the reamer and the punch, and then all components were trialed with the trial insert.  The knee was found to have excellent balance and full motion.    The above named components were then cemented into place and all excess cement was removed.  The trial polyethylene component was in place during cementation, and then was exchanged for the real polyethylene component.    The knee was easily taken through a range of motion and the patella tracked well and the knee irrigated copiously and the parapatellar and subcutaneous tissue closed with vicryl, and monocryl with steri strips for the skin.  The arthrotomy was closed at 90 of flexion. The wounds were dressed with sterile gauze and the tourniquet released and the patient was awakened and returned to the PACU in  stable and satisfactory condition.  There were no complications.  Total tourniquet time was 80 minutes.

## 2020-01-05 DIAGNOSIS — M1711 Unilateral primary osteoarthritis, right knee: Secondary | ICD-10-CM | POA: Diagnosis not present

## 2020-01-05 MED ORDER — OXYCODONE HCL 5 MG PO TABS: 5.0000 mg | ORAL_TABLET | Freq: Four times a day (QID) | ORAL | 0 refills | Status: DC | PRN

## 2020-01-05 MED ORDER — METHOCARBAMOL 500 MG PO TABS: 500.0000 mg | ORAL_TABLET | Freq: Four times a day (QID) | ORAL | 0 refills | Status: DC | PRN

## 2020-01-05 MED ORDER — ASPIRIN EC 325 MG PO TBEC
325.0000 mg | DELAYED_RELEASE_TABLET | Freq: Two times a day (BID) | ORAL | 0 refills | Status: AC
Start: 2020-01-05 — End: 2020-02-16

## 2020-01-05 MED ORDER — GABAPENTIN 300 MG PO CAPS: 300.0000 mg | ORAL_CAPSULE | Freq: Three times a day (TID) | ORAL | 0 refills | Status: DC

## 2020-01-05 NOTE — TOC Initial Note (Signed)
Transition of Care [facility]) - Initial/Assessment Note    Patient Details  Name: [name] MRN: [id number] Date of Birth: 1930/02/22  Transition of Care (TOC) CM/SW Contact:    Joaquin Courts, RN Phone Number: 01/05/2020, 11:55 AM  Clinical Narrative:   CM spoke with patient.  Patient set up with [facility] for HHPT.  [employer] to deliver rolling walker to bedside.                 Expected Discharge Plan: [place] Barriers to Discharge: No Barriers Identified   Patient Goals and CMS Choice Patient states their goals for this hospitalization and ongoing recovery are:: to go home CMS Medicare.gov Compare Post Acute Care list provided to:: Patient Choice offered to / list presented to : Patient  Expected Discharge Plan and Services Expected Discharge Plan: [place]   Discharge Planning Services: CM Consult Post Acute Care Choice: [place] arrangements for the past 2 months: Single Family Home Expected Discharge Date: 01/05/20               DME Arranged: Gilford Rile rolling DME Agency: [employer] Date DME Agency Contacted: 01/05/20 Time DME Agency Contacted: [id number] Representative spoke with at DME Agency: [place]: PT [place]: [place] ([place]) Date [place]: 01/05/20 Time [place]: [id number] Representative spoke with at [place]: Corene Cornea  Prior Living Arrangements/Services Living arrangements for the past 2 months: [place] with:: Self Patient language and need for interpreter reviewed:: Yes Do you feel safe going back to the place where you live?: Yes      Need for Family Participation in Patient Care: Yes (Comment) Care giver support system in place?: Yes (comment)   Criminal Activity/Legal Involvement Pertinent to Current Situation/Hospitalization: No - Comment as needed  Activities of Daily Living Home Assistive Devices/Equipment: Bedside commode/3-in-1,  Walker (specify type), Eyeglasses ADL Screening (condition at time of admission) Patient's cognitive ability adequate to safely complete daily activities?: Yes Is the patient deaf or have difficulty hearing?: No Does the patient have difficulty seeing, even when wearing glasses/contacts?: No Does the patient have difficulty concentrating, remembering, or making decisions?: No Patient able to express need for assistance with ADLs?: Yes Does the patient have difficulty dressing or bathing?: No Independently performs ADLs?: Yes (appropriate for developmental age) Does the patient have difficulty walking or climbing stairs?: Yes Weakness of Legs: Right Weakness of Arms/Hands: None  Permission Sought/Granted                  Emotional Assessment Appearance:: Appears stated age Attitude/Demeanor/Rapport: Engaged Affect (typically observed): Accepting Orientation: : Oriented to Self, Oriented to Place, Oriented to  Time, Oriented to Situation   Psych Involvement: No (comment)  Admission diagnosis:  Osteoarthritis of right knee [M17.11] Patient Active Problem List   Diagnosis Date Noted  . Osteoarthritis of right knee 01/04/2020   PCP:  [name], MD Pharmacy:   [facility] [id number] - [place], [place] - [address] AT [place] [address] [place] [address] Phone: [phone] Fax: [phone]     Social Determinants of Health (SDOH) Interventions    Readmission Risk Interventions No flowsheet data found.

## 2020-01-05 NOTE — Plan of Care (Signed)
  Problem: Activity: Goal: Ability to avoid complications of mobility impairment will improve Outcome: Progressing   

## 2020-01-05 NOTE — Progress Notes (Addendum)
Subjective: 1 Day Post-Op Procedure(s) (LRB): TOTAL KNEE ARTHROPLASTY (Right) Patient reports pain as mild.   Patient seen in rounds for Dr. Theda Allen. Patient is well, and has had no acute complaints or problems other than discomfort in the right knee. No acute events overnight. Ambulated 100 ft with PT. Denies CP, SHOB, N/V.  We will continue therapy today.   Objective: Vital signs in last 24 hours: Temp:  [97.4 F (36.3 C)-98 F (36.7 C)] 97.8 F (36.6 C) (07/31 0954) Pulse Rate:  [66-97] 70 (07/31 0954) Resp:  [12-17] 17 (07/31 0954) BP: (100-134)/(40-66) 104/40 (07/31 0954) SpO2:  [92 %-100 %] 95 % (07/31 0954)  Intake/Output from previous day:  Intake/Output Summary (Last 24 hours) at 01/05/2020 1020 Last data filed at 01/05/2020 1004 Gross per 24 hour  Intake 1597.51 ml  Output 2685 ml  Net -1087.49 ml     Intake/Output this shift: Total I/O In: 902.5 [P.O.:600; I.V.:302.5] Out: 800 [Urine:800]  Labs: Recent Labs    01/04/20 1021  HGB 13.1   Recent Labs    01/04/20 1021  WBC 13.9*  RBC 4.15  HCT 39.9  PLT 179   Recent Labs    01/04/20 1021  CREATININE 0.69   Recent Labs    01/04/20 1021  INR 1.1    Exam: General - Patient is Alert and Oriented Extremity - Neurologically intact Sensation intact distally Intact pulses distally Dorsiflexion/Plantar flexion intact Dressing - dressing C/D/I Motor Function - intact, moving foot and toes well on exam.   Past Medical History:  Diagnosis Date  . Arthritis   . Cancer ([place])    breast Ca. on 2013  . GERD (gastroesophageal reflux disease)   . Hypertension   . Pneumonia   . Polymyalgia rheumatica ([place])   . Seizures ([facility])     Assessment/Plan: 1 Day Post-Op Procedure(s) (LRB): TOTAL KNEE ARTHROPLASTY (Right) Active Problems:   Osteoarthritis of right knee  Estimated body mass index is 26.52 kg/m as calculated from the following:   Height as of this encounter: 5\' 2"  (1.575 m).   Weight as  of this encounter: 65.8 kg. Advance diet Up with therapy  Anticipated LOS equal to or greater than 2 midnights due to - Age 84 and older with one or more of the following:  - Obesity  - Expected need for hospital services (PT, OT, Nursing) required for safe  discharge  - Anticipated need for postoperative skilled nursing care or inpatient rehab  - Active co-morbidities: None OR   - Unanticipated findings during/Post Surgery: None  - Patient is a high risk of re-admission due to: None    DVT Prophylaxis - Lovenox then aspirin at discharge Weight bearing as tolerated. Aquacel to remain in place. Hemovac pulled without difficulty.  Plan is to go Skilled nursing facility after hospital stay. I had a long discussion this morning with the patient and her sister, and ultimately decided that we will pursue SNF placement. She does have temporary help at home, but she is very concerned about the level of care she may need, and prefers to go to SNF. I discussed with Elizabeth Sauer, PA-C who is okay with this as well. It may likely be Monday before we have placement, but we will begin the process today.   Addendum: After her session of PT, she was feeling able to discharge home with family. She does have a sister who can stay with her full time until next Monday/Tuesday. We will order HHPT & rolling  walker for her today.  Griffith Citron, PA-C Orthopedic Surgery [phone] 01/05/2020, 10:20 AM

## 2020-01-05 NOTE — Plan of Care (Signed)
  Problem: Education: Goal: Knowledge of the prescribed therapeutic regimen will improve Outcome: Progressing   Problem: Pain Management: Goal: Pain level will decrease with appropriate interventions Outcome: Progressing   

## 2020-01-05 NOTE — Progress Notes (Signed)
Physical Therapy Treatment Patient Details Name: [name] MRN: [id number] DOB: 02/20/1930 Today's Date: 01/05/2020    History of Present Illness Patient is 86 y.o. female s/p Rt TKA on 01/04/20 with PMH significant for seizures, HTN, GERD, breast cancer (2013), OA, polymyalgia rheumatica.    PT Comments    Pt with very little pain today, and moving better. Educated patient and daughter with exercises program and handout. Worked on ambulation as well and talked about progression. Patient original concern about going home was home alone and wanting the best progression for her knee. She is doing fairly well, duaghter will be able to be with her initially for 24/7 for 1 week, then pt will be alone. Will continue to assess safety and progress this afternoon session .     Follow Up Recommendations  Follow surgeons recommendation for DC plan and follow-up therapies     Equipment Recommendations  Rolling walker with 5" wheels (youth)    Recommendations for Other Services       Precautions / Restrictions Precautions Precautions: Fall Precaution Comments: reports 1 fall back in december while working in her kitchen. Restrictions Weight Bearing Restrictions: No Other Position/Activity Restrictions: WBAT    Mobility  Bed Mobility Overal bed mobility:  (pt was already up and in recliner)                Transfers Overall transfer level: Needs assistance Equipment used: Rolling walker (2 wheeled) Transfers: Sit to/from Stand Sit to Stand: Min assist         General transfer comment: minA for initial stading for balance  Ambulation/Gait Ambulation/Gait assistance: Min guard Gait Distance (Feet): 100 Feet Assistive device: Rolling walker (2 wheeled) Gait Pattern/deviations: Step-to pattern Gait velocity: decr   General Gait Details: tolerated walking well, wants to go faster, still emphasizing step to pattern, but will transition to step through pattern very  soon   Stairs             Wheelchair Mobility    Modified Rankin (Stroke Patients Only)       Balance Overall balance assessment: Needs assistance Sitting-balance support: Feet supported Sitting balance-Leahy Scale: Good     Standing balance support: During functional activity;Bilateral upper extremity supported Standing balance-Leahy Scale: Good                              Cognition Arousal/Alertness: Awake/alert Behavior During Therapy: WFL for tasks assessed/performed Overall Cognitive Status: Within Functional Limits for tasks assessed                                        Exercises Total Joint Exercises Ankle Circles/Pumps: AAROM;Seated;10 reps;Both Quad Sets: AROM;Supine;Right;10 reps Heel Slides: AAROM;Supine;Right;10 reps Hip ABduction/ADduction: AAROM;Supine;Right;10 reps Straight Leg Raises: AAROM;Supine;Right;10 reps Long Arc Quad: AAROM;Seated;Right;10 reps Goniometric ROM: 0-100    General Comments        Pertinent Vitals/Pain Pain Assessment: No/denies pain Pain Score: 0-No pain Pain Location: denises any pain in knee even with exercises and walking Pain Intervention(s): Monitored during session;Ice applied    Home Living                      Prior Function            PT Goals (current goals can now be found in the care plan section)  Acute Rehab PT Goals Patient Stated Goal: wants to be able to go to the [facility] again for exercises program 3x a week PT Goal Formulation: With patient Time For Goal Achievement: 01/11/20 Potential to Achieve Goals: Good Progress towards PT goals: Progressing toward goals    Frequency    7X/week      PT Plan Discharge plan needs to be updated (patietn did better today and family and patietn discussing possiblity of DC home if they can care for patient initally. Continue to reassess)    Co-evaluation              AM-PAC PT "6 Clicks" Mobility    Outcome Measure  Help needed turning from your back to your side while in a flat bed without using bedrails?: A Little Help needed moving from lying on your back to sitting on the side of a flat bed without using bedrails?: A Little Help needed moving to and from a bed to a chair (including a wheelchair)?: A Little Help needed standing up from a chair using your arms (e.g., wheelchair or bedside chair)?: A Little Help needed to walk in hospital room?: A Little Help needed climbing 3-5 steps with a railing? : A Lot 6 Click Score: 17    End of Session Equipment Utilized During Treatment: Gait belt Activity Tolerance: Patient tolerated treatment well Patient left: in chair;with call bell/phone within reach;with family/visitor present Nurse Communication: Mobility status PT Visit Diagnosis: Muscle weakness (generalized) (M62.81);Difficulty in walking, not elsewhere classified (R26.2)     Time: 1010-1051 PT Time Calculation (min) (ACUTE ONLY): 41 min  Charges:  $Gait Training: 8-22 mins $Therapeutic Exercise: 8-22 mins $Therapeutic Activity: 8-22 mins                     [name], PT, MPT Acute Rehabilitation Services Office: [phone] Pager: [phone] 01/05/2020    [name], [name] 01/05/2020, 11:18 AM

## 2020-01-05 NOTE — Plan of Care (Signed)
  Problem: Clinical Measurements: Goal: Diagnostic test results will improve Outcome: Progressing   Problem: Clinical Measurements: Goal: Respiratory complications will improve Outcome: Progressing   Problem: Clinical Measurements: Goal: Cardiovascular complication will be avoided Outcome: Progressing   Problem: Coping: Goal: Level of anxiety will decrease Outcome: Progressing   Problem: Elimination: Goal: Will not experience complications related to bowel motility Outcome: Progressing   Problem: Safety: Goal: Ability to remain free from injury will improve Outcome: Progressing   Problem: Skin Integrity: Goal: Risk for impaired skin integrity will decrease Outcome: Progressing

## 2020-01-05 NOTE — Progress Notes (Signed)
Physical Therapy Treatment Patient Details Name: [name] MRN: [id number] DOB: 02-17-1930 Today's Date: 01/05/2020    History of Present Illness Patient is 86 y.o. female s/p Rt TKA on 01/04/20 with PMH significant for seizures, HTN, GERD, breast cancer (2013), OA, polymyalgia rheumatica.    PT Comments    Pt still doing well this afternoon, however a little pain and fatigue setting in. Still feel she is safe to DC home tomorrow with HHPT and daughter to assist and pt agrees. Will see pt in the morning.    Follow Up Recommendations  Follow surgeon's recommendation for DC plan and follow-up therapies;Home health PT     Equipment Recommendations  Rolling walker with 5" wheels    Recommendations for Other Services       Precautions / Restrictions Precautions Precautions: Fall Precaution Comments: reports 1 fall back in december while working in her kitchen. Restrictions Other Position/Activity Restrictions: WBAT    Mobility  Bed Mobility Overal bed mobility: Needs Assistance (pt was already up and in recliner) Bed Mobility: Sit to Supine     Supine to sit: Min assist     General bed mobility comments: assist with LE a little bit  Transfers Overall transfer level: Needs assistance Equipment used: Rolling walker (2 wheeled) Transfers: Sit to/from Stand Sit to Stand: Min guard         General transfer comment: more balanced this afternoon with sit to stands  Ambulation/Gait Ambulation/Gait assistance: Min guard Gait Distance (Feet): 100 Feet Assistive device: Rolling walker (2 wheeled) Gait Pattern/deviations: Step-to pattern;Step-through pattern     General Gait Details: began with step to pattern, however transitioned to step to pattern was more comfortable for her   Stairs             Wheelchair Mobility    Modified Rankin (Stroke Patients Only)       Balance                                            Cognition  Arousal/Alertness: Awake/alert Behavior During Therapy: WFL for tasks assessed/performed Overall Cognitive Status: Within Functional Limits for tasks assessed                                        Exercises Total Joint Exercises Ankle Circles/Pumps: AAROM;Seated;10 reps;Both Quad Sets: AROM;Supine;Right;10 reps Heel Slides: AAROM;Supine;Right;10 reps Hip ABduction/ADduction: Right;10 reps;Supine    General Comments        Pertinent Vitals/Pain Pain Assessment: 0-10 Pain Score: 4  Pain Location: Little bit more pain this afternoon. Educated on taking it easy sometimes and letting pain be your monitor to ice and and rest. Limited session this afternoon a little to help with pain. Pain Descriptors / Indicators: Aching;Discomfort;Sore    Home Living                      Prior Function            PT Goals (current goals can now be found in the care plan section) Acute Rehab PT Goals Patient Stated Goal: wants to be able to go to the [facility] for exercises program 3x a week PT Goal Formulation: With patient Time For Goal Achievement: 01/11/20 Potential to Achieve Goals: Good Progress towards PT  goals: Progressing toward goals    Frequency    7X/week      PT Plan Discharge plan needs to be updated    Co-evaluation              AM-PAC PT "6 Clicks" Mobility   Outcome Measure  Help needed turning from your back to your side while in a flat bed without using bedrails?: A Little Help needed moving from lying on your back to sitting on the side of a flat bed without using bedrails?: A Little Help needed moving to and from a bed to a chair (including a wheelchair)?: A Little Help needed standing up from a chair using your arms (e.g., wheelchair or bedside chair)?: A Little Help needed to walk in hospital room?: A Little Help needed climbing 3-5 steps with a railing? : A Little 6 Click Score: 18    End of Session Equipment Utilized  During Treatment: Gait belt Activity Tolerance: Patient tolerated treatment well Patient left: in bed;with call bell/phone within reach;with bed alarm set;with family/visitor present Nurse Communication: Mobility status PT Visit Diagnosis: Muscle weakness (generalized) (M62.81);Difficulty in walking, not elsewhere classified (R26.2)     Time: 0932-3557 PT Time Calculation (min) (ACUTE ONLY): 23 min  Charges:  $Gait Training: 8-22 mins $Therapeutic Exercise: 8-22 mins                     Ara Mano, PT, MPT Acute Rehabilitation Services Office: [phone] Pager: [phone] 01/05/2020    Clide Dales 01/05/2020, 5:23 PM

## 2020-01-06 DIAGNOSIS — M1711 Unilateral primary osteoarthritis, right knee: Secondary | ICD-10-CM | POA: Diagnosis not present

## 2020-01-06 NOTE — Progress Notes (Signed)
Subjective: 2 Days Post-Op Procedure(s) (LRB): TOTAL KNEE ARTHROPLASTY (Right) Seen in rounds today for Dr. [name] Patient reports pain as 10 on 0-10 scale.  She states that she is having a lot of pain coming from the lumbar area which is pretty normal for her that does seem to be radiating down the leg.  She only got 1 oxycodone this morning.  Reports no events overnight.  She has been able to void, has had flatus, no bowel movement.  Working with physical therapy and doing well.  No complaints or concerns today.  Objective: Vital signs in last 24 hours: Temp:  [97.8 F (36.6 C)-100.4 F (38 C)] 99.3 F (37.4 C) (08/01 0502) Pulse Rate:  [70-108] 105 (08/01 0502) Resp:  [17-18] 18 (08/01 0502) BP: (104-120)/(40-56) 114/55 (08/01 0502) SpO2:  [92 %-95 %] 92 % (08/01 0502)  Intake/Output from previous day: 07/31 0701 - 08/01 0700 In: 1862.5 [P.O.:1560; I.V.:302.5] Out: 1750 [Urine:1750] Intake/Output this shift: Total I/O In: 240 [P.O.:240] Out: -   Recent Labs    01/04/20 1021  HGB 13.1   Recent Labs    01/04/20 1021  WBC 13.9*  RBC 4.15  HCT 39.9  PLT 179   Recent Labs    01/04/20 1021  CREATININE 0.69   Recent Labs    01/04/20 1021  INR 1.1    Neurologically intact Neurovascular intact Sensation intact distally Intact pulses distally Dorsiflexion/Plantar flexion intact Incision: dressing C/D/I No cellulitis present Compartment soft   Assessment/Plan: 2 Days Post-Op Procedure(s) (LRB): TOTAL KNEE ARTHROPLASTY (Right) Up with therapy, depending on how therapy goes today she could possibly go home this afternoon Discharge home with home health this is being set up over the weekend.  She will have her daughter at home with her for the first week or 2 to help. She will go home on pain medication she will also start aspirin 325 mg twice a day for 6 weeks She will follow-up in the office with [place] in 2 weeks.    Patient's anticipated LOS is less than 2  midnights, meeting these requirements: - Younger than 60 - Lives within 1 hour of care - Has a competent adult at home to recover with post-op recover - NO history of  - Chronic pain requiring opiods  - Diabetes  - Coronary Artery Disease  - Heart failure  - Heart attack  - Stroke  - DVT/VTE  - Cardiac arrhythmia  - Respiratory Failure/COPD  - Renal failure  - Anemia  - Advanced Liver disease       Derrick Ravel [phone] 01/06/2020, 8:24 AM

## 2020-01-06 NOTE — Progress Notes (Signed)
Physical Therapy Treatment Patient Details Name: [name] MRN: [id number] DOB: 1929-06-13 Today's Date: 01/06/2020    History of Present Illness Patient is 86 y.o. female s/p Rt TKA on 01/04/20 with PMH significant for seizures, HTN, GERD, breast cancer (2013), OA, polymyalgia rheumatica.    PT Comments    Pt continues to participate well. A little drowsy from meds this morning. Family present (one daughter is a Clinical research associate per report). Reviewed/practiced exercises and gait training. Pt and family feel comfortable discharging home on today. Okay to d/c from PT standpoint.     Follow Up Recommendations  Follow surgeon's recommendation for DC plan and follow-up therapies;Supervision/Assistance - 24 hour     Equipment Recommendations       Recommendations for Other Services       Precautions / Restrictions Precautions Precautions: Fall Precaution Comments: reports 1 fall back in december while working in her kitchen. Restrictions Weight Bearing Restrictions: No Other Position/Activity Restrictions: WBAT    Mobility  Bed Mobility Overal bed mobility: Needs Assistance Bed Mobility: Supine to Sit     Supine to sit: Min guard     General bed mobility comments: Min guard for safety. Increased time.  Transfers Overall transfer level: Needs assistance Equipment used: Rolling walker (2 wheeled) Transfers: Sit to/from Stand Sit to Stand: Min guard         General transfer comment: Min guard for safety. Increased time.  Ambulation/Gait Ambulation/Gait assistance: Min assist Gait Distance (Feet): 75 Feet Assistive device: Rolling walker (2 wheeled) Gait Pattern/deviations: Step-to pattern;Step-through pattern;Decreased stride length     General Gait Details: Cues for posture, RW proximity, step lengths, pacing. Instructed pt to continued with step to pattern for improved stability. Fatigues fairly easily.   Stairs             Wheelchair Mobility     Modified Rankin (Stroke Patients Only)       Balance Overall balance assessment: Needs assistance         Standing balance support: Bilateral upper extremity supported Standing balance-Leahy Scale: Poor                              Cognition Arousal/Alertness: Awake/alert Behavior During Therapy: WFL for tasks assessed/performed Overall Cognitive Status: Within Functional Limits for tasks assessed                                        Exercises Total Joint Exercises Ankle Circles/Pumps: AROM;Both;10 reps Quad Sets: AROM;Both;10 reps Heel Slides: AAROM;Right;10 reps Hip ABduction/ADduction: AROM;AAROM;Right;10 reps Straight Leg Raises: AROM;Right;10 reps Goniometric ROM: ~5-85    General Comments        Pertinent Vitals/Pain Pain Assessment: 0-10 Pain Score: 4  Pain Location: R knee/thigh, back Pain Descriptors / Indicators: Aching;Discomfort;Sore Pain Intervention(s): Monitored during session;Ice applied;Premedicated before session    Home Living                      Prior Function            PT Goals (current goals can now be found in the care plan section) Progress towards PT goals: Progressing toward goals    Frequency    7X/week      PT Plan Current plan remains appropriate    Co-evaluation  AM-PAC PT "6 Clicks" Mobility   Outcome Measure  Help needed turning from your back to your side while in a flat bed without using bedrails?: A Little Help needed moving from lying on your back to sitting on the side of a flat bed without using bedrails?: A Little Help needed moving to and from a bed to a chair (including a wheelchair)?: A Little Help needed standing up from a chair using your arms (e.g., wheelchair or bedside chair)?: A Little Help needed to walk in hospital room?: A Little Help needed climbing 3-5 steps with a railing? : A Little 6 Click Score: 18    End of Session Equipment  Utilized During Treatment: Gait belt Activity Tolerance: Patient tolerated treatment well Patient left: in chair;with call bell/phone within reach;with family/visitor present   PT Visit Diagnosis: Other abnormalities of gait and mobility (R26.89)     Time: 2778-2423 PT Time Calculation (min) (ACUTE ONLY): 23 min  Charges:  $Gait Training: 8-22 mins $Therapeutic Exercise: 8-22 mins                         Doreatha Massed, PT Acute Rehabilitation  Office: [phone] Pager: [phone]

## 2020-01-06 NOTE — Progress Notes (Signed)
Pt stable at time of discharge education and instructions. No needs at time of discharge. Pt dressing dry an intact though stain marked. Pt to go home with rolling walker.

## 2020-01-06 NOTE — Plan of Care (Signed)
  Problem: Pain Management: Goal: Pain level will decrease with appropriate interventions Outcome: Progressing   Problem: Skin Integrity: Goal: Will show signs of wound healing Outcome: Progressing   Problem: Clinical Measurements: Goal: Diagnostic test results will improve Outcome: Progressing   Problem: Clinical Measurements: Goal: Will remain free from infection Outcome: Progressing   Problem: Nutrition: Goal: Adequate nutrition will be maintained Outcome: Progressing

## 2020-01-07 ENCOUNTER — Encounter (HOSPITAL_COMMUNITY): Payer: Self-pay | Admitting: Specialist

## 2020-01-23 NOTE — Discharge Summary (Signed)
Physician Discharge Summary  Patient ID: [name] MRN: [id number] DOB/AGE: 1929-12-22 86 y.o.  Admit date: 01/04/2020 Discharge date: 01/06/2020  Admission Diagnoses:  Right knee osteoarthritis  Discharge Diagnoses:  Active Problems:   Osteoarthritis of right knee   Discharged Condition: good  Hospital Course: Patient was admitted on July 30 for a right total knee arthroplasty due to end-stage right knee osteoarthritis.  Patient tolerated the surgery well.  She was sent in to PACU in stable condition.  She was sent up to the postop floor in stable condition with no complications.  Postop day 1, patient had no complications overnight.  She was able to get up with physical therapy night 1 after surgery.  Patient was continuing to work with physical therapy.  Drain was pulled without any issues.  At this time she did want to go to a skilled nursing facility because she was concerned about possibly not having anybody at home to take care of her.  Later on that day she did feel like she was able to go home with some family assistance.  Postop day 2 patient doing well, no complications overnight.  Continue to work with physical therapy to continue to gain strength.  She was able to be discharged home this day.  She is going be discharged home with pain medication, nausea medication as well as muscle relaxer.  She is can be doing home health physical therapy.  She also has a rolling walker.  She is going to be doing aspirin 325 mg twice a day for DVT prevention.  Consults: None  Significant Diagnostic Studies: none  Treatments: IV hydration, antibiotics: Ancef, analgesia: acetaminophen and oxycodone, anticoagulation: Lovenox and therapies: PT  Discharge Exam: Blood pressure (!) 114/55, pulse 105, temperature 99.3 F (37.4 C), resp. rate 18, height 5\' 2"  (1.575 m), weight 65.8 kg, SpO2 92 %. General appearance: alert, appears stated age and no distress Extremities: extremities normal,  atraumatic, no cyanosis or edema Pulses: 2+ and symmetric Skin: Skin color, texture, turgor normal. No rashes or lesions Neurologic: Grossly normal Incision/Wound: dressings clean dry and intact  Disposition: Discharge disposition: 01-Home or Self Care       Discharge Instructions    Call MD / Call 911   Complete by: As directed    If you experience chest pain or shortness of breath, CALL 911 and be transported to the hospital emergency room.  If you develope a fever above 101 F, pus (white drainage) or increased drainage or redness at the wound, or calf pain, call your surgeon's office.   Call MD / Call 911   Complete by: As directed    If you experience chest pain or shortness of breath, CALL 911 and be transported to the hospital emergency room.  If you develope a fever above 101 F, pus (white drainage) or increased drainage or redness at the wound, or calf pain, call your surgeon's office.   Change dressing   Complete by: As directed    You may remove the bulky bandage (ACE wrap and gauze) two days after surgery. You will have an adhesive waterproof bandage underneath. Leave this in place until your first follow-up appointment.   Constipation Prevention   Complete by: As directed    Drink plenty of fluids.  Prune juice may be helpful.  You may use a stool softener, such as Colace (over the counter) 100 mg twice a day.  Use MiraLax (over the counter) for constipation as needed.   Constipation Prevention  Complete by: As directed    Drink plenty of fluids.  Prune juice may be helpful.  You may use a stool softener, such as Colace (over the counter) 100 mg twice a day.  Use MiraLax (over the counter) for constipation as needed.   Diet - low sodium heart healthy   Complete by: As directed    Diet - low sodium heart healthy   Complete by: As directed    Discharge instructions   Complete by: As directed    Dr. Sydnee Cabal [facility] [address] [place],  [place] [address] [phone]  TOTAL KNEE REPLACEMENT POSTOPERATIVE DIRECTIONS  Knee Rehabilitation, Guidelines Following Surgery  Results after knee surgery are often greatly improved when you follow the exercise, range of motion and muscle strengthening exercises prescribed by your doctor. Safety measures are also important to protect the knee from further injury. Any time any of these exercises cause you to have increased pain or swelling in your knee joint, decrease the amount until you are comfortable again and slowly increase them. If you have problems or questions, call your caregiver or physical therapist for advice.   HOME CARE INSTRUCTIONS  Remove items at home which could result in a fall. This includes throw rugs or furniture in walking pathways.  ICE to the affected knee every three hours for 30 minutes at a time and then as needed for pain and swelling.  Continue to use ice on the knee for pain and swelling from surgery. You may notice swelling that will progress down to the foot and ankle.  This is normal after surgery.  Elevate the leg when you are not up walking on it.   Continue to use the breathing machine which will help keep your temperature down.  It is common for your temperature to cycle up and down following surgery, especially at night when you are not up moving around and exerting yourself.  The breathing machine keeps your lungs expanded and your temperature down. Do not place pillow under knee, focus on keeping the knee straight while resting   DIET You may resume your previous home diet once your are discharged from the hospital.  DRESSING / WOUND CARE / SHOWERING Keep the surgical dressing until follow up.  The dressing is water proof, but you need to put extra covering over it like plastic wrap.  IF THE DRESSING FALLS OFF or the wound gets wet inside, change the dressing with sterile gauze.  Please use good hand washing techniques before changing the dressing.  Do not use  any lotions or creams on the incision until instructed by your surgeon.   You may start showering once you are discharged home but do not submerge the incision under water.  You are sent home with an ACE bandage on over the leg, this can be removed at 3 days from surgery. At this time you can start showering. Please place the white TED stocking on the surgical leg after. This needs to be worn on the surgical leg for 2 weeks after surgery.   ACTIVITY Walk with your walker as instructed. Use walker as long as suggested by your caregivers. Avoid periods of inactivity such as sitting longer than an hour when not asleep. This helps prevent blood clots.  You may resume a sexual relationship in one month or when given the OK by your doctor.  You may return to work once you are cleared by your doctor.  Do not drive a car  for 6 weeks or until released by you surgeon.  Do not drive while taking narcotics.  WEIGHT BEARING Weight bearing as tolerated with assist device (walker, cane, etc) as directed, use it as long as suggested by your surgeon or therapist, typically at least 4-6 weeks.  POSTOPERATIVE CONSTIPATION PROTOCOL Constipation - defined medically as fewer than three stools per week and severe constipation as less than one stool per week.  One of the most common issues patients have following surgery is constipation.  Even if you have a regular bowel pattern at home, your normal regimen is likely to be disrupted due to multiple reasons following surgery.  Combination of anesthesia, postoperative narcotics, change in appetite and fluid intake all can affect your bowels.  In order to avoid complications following surgery, here are some recommendations in order to help you during your recovery period.  Colace (docusate) - Pick up an over-the-counter form of Colace or another stool softener and take twice a day as long as you are requiring postoperative pain medications.  Take with a full glass of  water daily.  If you experience loose stools or diarrhea, hold the colace until you stool forms back up.  If your symptoms do not get better within 1 week or if they get worse, check with your doctor.  Dulcolax (bisacodyl) - Pick up over-the-counter and take as directed by the product packaging as needed to assist with the movement of your bowels.  Take with a full glass of water.  Use this product as needed if not relieved by Colace only.   MiraLax (polyethylene glycol) - Pick up over-the-counter to have on hand.  MiraLax is a solution that will increase the amount of water in your bowels to assist with bowel movements.  Take as directed and can mix with a glass of water, juice, soda, coffee, or tea.  Take if you go more than two days without a movement. Do not use MiraLax more than once per day. Call your doctor if you are still constipated or irregular after using this medication for 7 days in a row.  If you continue to have problems with postoperative constipation, please contact the office for further assistance and recommendations.  If you experience "the worst abdominal pain ever" or develop nausea or vomiting, please contact the office immediatly for further recommendations for treatment.  ITCHING  If you experience itching with your medications, try taking only a single pain pill, or even half a pain pill at a time.  You can also use Benadryl over the counter for itching or also to help with sleep.   TED HOSE STOCKINGS Wear the elastic stockings on both legs for two weeks following surgery during the day but you may remove then at night for sleeping.  Okay to remove ACE in 3 days, put TED on after  MEDICATIONS See your medication summary on the "After Visit Summary" that the nursing staff will review with you prior to discharge.  You may have some home medications which will be placed on hold until you complete the course of blood thinner medication.  It is important for you to complete  the blood thinner medication as prescribed by your surgeon.  Continue your approved medications as instructed at time of discharge. If you were not previously taking any blood thinners prior to surgery please start taking Aspirin 325 mg tabs twice daily for 6 weeks. If you are unable to take Aspirin please let your doctor know.   PRECAUTIONS If  you experience chest pain or shortness of breath - call 911 immediately for transfer to the hospital emergency department.  If you develop a fever greater that 101 F, purulent drainage from wound, increased redness or drainage from wound, foul odor from the wound/dressing, or calf pain - CONTACT YOUR SURGEON.                                                   FOLLOW-UP APPOINTMENTS Make sure you keep all of your appointments after your operation with your surgeon and caregivers. You should call the office at the above phone number and make an appointment for approximately two weeks after the date of your surgery or on the date instructed by your surgeon outlined in the "After Visit Summary".   RANGE OF MOTION AND STRENGTHENING EXERCISES  Rehabilitation of the knee is important following a knee injury or an operation. After just a few days of immobilization, the muscles of the thigh which control the knee become weakened and shrink (atrophy). Knee exercises are designed to build up the tone and strength of the thigh muscles and to improve knee motion. Often times heat used for twenty to thirty minutes before working out will loosen up your tissues and help with improving the range of motion but do not use heat for the first two weeks following surgery. These exercises can be done on a training (exercise) mat, on the floor, on a table or on a bed. Use what ever works the best and is most comfortable for you Knee exercises include:  Leg Lifts - While your knee is still immobilized in a splint or cast, you can do straight leg raises. Lift the leg to 60 degrees, hold  for 3 sec, and slowly lower the leg. Repeat 10-20 times 2-3 times daily. Perform this exercise against resistance later as your knee gets better.  Quad and Hamstring Sets - Tighten up the muscle on the front of the thigh (Quad) and hold for 5-10 sec. Repeat this 10-20 times hourly. Hamstring sets are done by pushing the foot backward against an object and holding for 5-10 sec. Repeat as with quad sets.  Leg Slides: Lying on your back, slowly slide your foot toward your buttocks, bending your knee up off the floor (only go as far as is comfortable). Then slowly slide your foot back down until your leg is flat on the floor again. Angel Wings: Lying on your back spread your legs to the side as far apart as you can without causing discomfort.  A rehabilitation program following serious knee injuries can speed recovery and prevent re-injury in the future due to weakened muscles. Contact your doctor or a physical therapist for more information on knee rehabilitation.   IF YOU ARE TRANSFERRED TO A SKILLED REHAB FACILITY If the patient is transferred to a skilled rehab facility following release from the hospital, a list of the current medications will be sent to the facility for the patient to continue.  When discharged from the skilled rehab facility, please have the facility set up the patient's [place] prior to being released. Also, the skilled facility will be responsible for providing the patient with their medications at time of release from the facility to include their pain medication, the muscle relaxants, and their blood thinner medication. If the patient is still  at the rehab facility at time of the two week follow up appointment, the skilled rehab facility will also need to assist the patient in arranging follow up appointment in our office and any transportation needs.  MAKE SURE YOU:  Understand these instructions.  Get help right away if you are not doing well or get worse.     Pick up stool softner and laxative for home use following surgery while on pain medications. May shower starting three days after surgery. Please use a clean towel to pat the leg dry following showers. Continue to use ice for pain and swelling after surgery. Do not use any lotions or creams on the incision until instructed by you Start Aspirin immediately following surgery.   Do not put a pillow under the knee. Place it under the heel.   Complete by: As directed    Do not put a pillow under the knee. Place it under the heel.   Complete by: As directed    Driving restrictions   Complete by: As directed    No driving for two weeks   Driving restrictions   Complete by: As directed    No driving for two weeks   TED hose   Complete by: As directed    Use stockings (TED hose) for three weeks on both leg(s).  You may remove them at night for sleeping.   TED hose   Complete by: As directed    Use stockings (TED hose) for three weeks on both leg(s).  You may remove them at night for sleeping.   Weight bearing as tolerated   Complete by: As directed    Weight bearing as tolerated   Complete by: As directed      Allergies as of 01/06/2020      Reactions   Sulfa Antibiotics Nausea Only   Penicillins Swelling   Tolerated Cephalosporin 01/04/20     Simvastatin Swelling      Medication List    STOP taking these medications   celecoxib 200 MG capsule Commonly known as: CELEBREX     TAKE these medications   acetaminophen 500 MG tablet Commonly known as: TYLENOL Take 2 tablets (1,000 mg total) by mouth every 6 (six) hours as needed. What changed: reasons to take this   albuterol 108 (90 Base) MCG/ACT inhaler Commonly known as: VENTOLIN HFA Inhale 1-2 puffs into the lungs every 6 (six) hours as needed for wheezing or shortness of breath.   amLODipine 5 MG tablet Commonly known as: NORVASC Take 5 mg by mouth daily.   aspirin EC 325 MG tablet Take 1 tablet (325 mg total) by  mouth in the morning and at bedtime.   escitalopram 10 MG tablet Commonly known as: LEXAPRO Take 10 mg by mouth daily.   ezetimibe 10 MG tablet Commonly known as: ZETIA Take 10 mg by mouth daily.   famotidine 40 MG tablet Commonly known as: PEPCID Take 40 mg by mouth at bedtime.   fluticasone 50 MCG/ACT nasal spray Commonly known as: FLONASE Place 1 spray into the nose daily as needed for allergies.   gabapentin 300 MG capsule Commonly known as: NEURONTIN Take 1 capsule (300 mg total) by mouth 3 (three) times daily. Take a 300 mg capsule three times a day for two weeks following surgery.Then take a 300 mg capsule two times a day for two weeks. Then take a 300 mg capsule once a day for two weeks. Then discontinue the Gabapentin.   hydrochlorothiazide 12.5 MG  tablet Commonly known as: HYDRODIURIL Take 12.5 mg by mouth daily.   methocarbamol 500 MG tablet Commonly known as: ROBAXIN Take 1 tablet (500 mg total) by mouth every 6 (six) hours as needed for muscle spasms.   multivitamin with minerals Tabs tablet Take 1 tablet by mouth daily.   oxyCODONE 5 MG immediate release tablet Commonly known as: Oxy IR/ROXICODONE Take 1-2 tablets (5-10 mg total) by mouth every 6 (six) hours as needed for moderate pain (pain score 4-6).   pantoprazole 40 MG tablet Commonly known as: PROTONIX Take 40 mg by mouth daily.   PHENobarbital 97.2 MG tablet Commonly known as: LUMINAL Take 97.2 mg by mouth at bedtime.   predniSONE 5 MG tablet Commonly known as: DELTASONE Take 5 mg by mouth daily with breakfast.            Discharge Care Instructions  (From admission, onward)         Start     Ordered   01/06/20 0000  Weight bearing as tolerated        01/06/20 1157   01/05/20 0000  Weight bearing as tolerated        01/05/20 1105   01/05/20 0000  Change dressing       Comments: You may remove the bulky bandage (ACE wrap and gauze) two days after surgery. You will have an adhesive  waterproof bandage underneath. Leave this in place until your first follow-up appointment.   01/05/20 [place] [place] Follow up.   Why: agency will provide home health physical therapy. Contact information: [place] [address] [phone]               Signed: Drue Novel 01/23/2020, 12:42 PM

## 2020-01-28 ENCOUNTER — Other Ambulatory Visit: Payer: Self-pay

## 2020-01-28 ENCOUNTER — Ambulatory Visit: Payer: Medicare Other | Attending: Specialist | Admitting: Physical Therapy

## 2020-01-28 DIAGNOSIS — R2681 Unsteadiness on feet: Secondary | ICD-10-CM | POA: Diagnosis present

## 2020-01-28 DIAGNOSIS — M25561 Pain in right knee: Secondary | ICD-10-CM | POA: Diagnosis present

## 2020-01-28 DIAGNOSIS — R262 Difficulty in walking, not elsewhere classified: Secondary | ICD-10-CM | POA: Insufficient documentation

## 2020-01-28 DIAGNOSIS — M25661 Stiffness of right knee, not elsewhere classified: Secondary | ICD-10-CM | POA: Diagnosis present

## 2020-01-28 DIAGNOSIS — R2689 Other abnormalities of gait and mobility: Secondary | ICD-10-CM | POA: Diagnosis present

## 2020-01-28 DIAGNOSIS — M6281 Muscle weakness (generalized): Secondary | ICD-10-CM | POA: Diagnosis present

## 2020-01-28 NOTE — Patient Instructions (Signed)
    Home exercise program created by Colt Martelle, PT.  For questions, please contact Chelcie Estorga via phone at [phone] or email at [email or web address]  [facility] [place] [address]  [address] [place], [place], [address] Phone: [phone]   Fax:  [phone]    

## 2020-01-28 NOTE — Therapy (Signed)
[facility] [address]  [place], [place], [address] Phone: [phone]   Fax:  [phone]  Physical Therapy Evaluation  Patient Details  Name: [name] MRN: [id number] Date of Birth: Jan 06, 1930 Referring Provider (PT): R. Hart Robinsons, MD   Encounter Date: 01/28/2020   PT End of Session - 01/28/20 1444    Visit Number 1    Number of Visits 12    Date for PT Re-Evaluation 02/25/20    Authorization Type UHC Medicare    PT Start Time 1444    PT Stop Time 1532    PT Time Calculation (min) 48 min    Activity Tolerance Patient tolerated treatment well    Behavior During Therapy [facility] for tasks assessed/performed           Past Medical History:  Diagnosis Date  . Arthritis   . Cancer ([place])    breast Ca. on 2013  . GERD (gastroesophageal reflux disease)   . Hypertension   . Pneumonia   . Polymyalgia rheumatica ([place])   . Seizures ([place])     Past Surgical History:  Procedure Laterality Date  . ABDOMINAL HYSTERECTOMY    . BREAST SURGERY    . TONSILLECTOMY    . TOTAL KNEE ARTHROPLASTY Right 01/04/2020   Procedure: TOTAL KNEE ARTHROPLASTY;  Surgeon: Sydnee Cabal, MD;  Location: [facility];  Service: Orthopedics;  Laterality: Right;  adductor canal    There were no vitals filed for this visit.    Subjective Assessment - 01/28/20 1446    Subjective Pt s/p R TKA on 01/04/20 with HH PT until 01/25/20. Has been using rollator in home. She reports "quite a bit of pain" which interferes with her sleep and otherwise after she does her exercises. Notes she started having draining drainage from lower incision while performing heel slide with HH PT after she had seen MD for post-op visit - currently covered by large bandaid - pt reports blood is dark red and appears to be old blood. Also notes increased swelling in lower leg with some pain along tibia and difficulty getting going in the morning since working with [facility]  PT and compression stockings discontinued.    Pertinent History R TKA 01/04/20    Limitations Walking    How long can you walk comfortably? 400 steps (mailbox and back)    Patient Stated Goals "walk w/o pain in my leg, esp my ankle"    Currently in Pain? No/denies    Pain Score 0-No pain   up to 7-8/10   Pain Location Knee    Pain Orientation Medial;Anterior;Lower    Pain Descriptors / Indicators Dull;Aching    Pain Type Acute pain;Surgical pain    Pain Onset 1 to 4 weeks ago    Pain Frequency Intermittent    Aggravating Factors  sleeping in bed; after exercises    Pain Relieving Factors ice, pain med q 6 hrs (1/2 oxycodone & Tylenol)    Effect of Pain on Daily Activities pain interferes with sleep, limited walking tolerance, difficulty with steps              [facility] PT Assessment - 01/28/20 1444      Assessment   Medical Diagnosis R TKA    Referring Provider (PT) R. Hart Robinsons, MD    Onset Date/Surgical Date 01/04/20    Hand Dominance Right    Next MD Visit 02/13/2020    Prior Therapy HH PT until 01/25/20  Restrictions   Weight Bearing Restrictions No      Balance Screen   Has the patient fallen in the past 6 months No    Has the patient had a decrease in activity level because of a fear of falling?  Yes    Is the patient reluctant to leave their home because of a fear of falling?  No      Home Environment   Living Environment Private residence    Living Arrangements Alone   widowed as of January - lost her husband to [place]   Available Help at Discharge Family   son lives w/in 5 miles & drives her to PT   Type of [place] entrance    Home Layout One level    [place] - 2 wheels;Walker - 4 wheels;Cane - single point;Bedside commode;Shower seat;Grab bars - toilet;Grab bars - tub/shower;Wheelchair - power      Prior Function   Level of Independence Independent    Vocation Retired    Leisure reading, walking daily 30-60 min,  shopping, out to eat, [employer] at [facility] 3x/wk prior to pandemic, senior activites through church      Cognition   Overall Cognitive Status Within Functional Limits for tasks assessed      Observation/Other Assessments   Focus on Therapeutic Outcomes (FOTO)  Knee - 35% (65% limitation); Predicted 52% (48% limitation)      ROM / Strength   AROM / PROM / Strength AROM;PROM;Strength      AROM   AROM Assessment Site Knee    Right/Left Knee Right;Left    Right Knee Extension 6   9 with LAQ   Right Knee Flexion 96    Left Knee Extension 0    Left Knee Flexion 128      PROM   PROM Assessment Site Knee    Right/Left Knee Right    Right Knee Extension 4    Right Knee Flexion 104      Strength   Strength Assessment Site Hip;Knee;Ankle    Right/Left Hip Right;Left    Right Hip Flexion 4/5    Right Hip Extension 4-/5    Right Hip ABduction 4-/5    Right Hip ADduction 3+/5    Left Hip Flexion 4/5    Left Hip Extension 4/5    Left Hip ABduction 4/5    Left Hip ADduction 4/5    Right/Left Knee Right;Left    Right Knee Flexion 4/5    Right Knee Extension 4/5    Left Knee Flexion 4+/5    Left Knee Extension 4+/5    Right/Left Ankle Right;Left    Right Ankle Dorsiflexion 4/5   sore in shin and top of foot   Left Ankle Dorsiflexion 4+/5      Ambulation/Gait   Ambulation/Gait Assistance 5: Supervision    Ambulation/Gait Assistance Details cues for RW proximity to improve directional control of RW    Assistive device Rolling walker    Gait Pattern Step-through pattern;Decreased weight shift to right;Decreased stance time - right;Decreased hip/knee flexion - right;Decreased stride length   decreased control of RW   Ambulation Surface Level;Indoor    Gait velocity decreased                      Objective measurements completed on examination: See above findings.       [facility] Adult PT Treatment/Exercise - 01/28/20 1444      Exercises  Exercises Knee/Hip       Knee/Hip Exercises: Stretches   Passive Hamstring Stretch Right;30 seconds;1 rep    Passive Hamstring Stretch Limitations supine with strap    ITB Stretch Right;30 seconds;1 rep    ITB Stretch Limitations supine crossbody with strap      Knee/Hip Exercises: Supine   Heel Prop for Knee Extension Limitations Provided instruction in proper positioning of LE in recliner with support under lower leg/ankle to encourage knee extension as well as positioning for knee extension stretch.                  PT Education - 01/28/20 1530    Education Details PT eval findings, anticipated POC and brief review of HH HEP + additons per Pt Instructions    Person(s) Educated Patient    Methods Explanation;Demonstration;Verbal cues;Handout    Comprehension Verbalized understanding;Verbal cues required;Returned demonstration;Need further instruction               PT Long Term Goals - 01/28/20 1532      PT LONG TERM GOAL #1   Title Patient to be independent with advanced HEP    Status New    Target Date 02/25/20      PT LONG TERM GOAL #2   Title Patient will demonstrate R knee AROM >/= 2-120 dg to allow for normal gait and stair mechanics    Status New    Target Date 02/25/20      PT LONG TERM GOAL #3   Title Patient will demonstrate improved B hip and knee strength to >/= 4+/5 for improved stability and ease of mobility    Status New    Target Date 02/25/20      PT LONG TERM GOAL #4   Title Patient will ambulate with normal gait pattern with or w/o SPC or LRAD    Status New    Target Date 02/25/20      PT LONG TERM GOAL #5   Title Patient will negotiate stairs reciprocally with normal step pattern w/o limitation due to R knee pain or weakness    Status New    Target Date 02/25/20                  Plan - 01/28/20 1532    Clinical Impression Statement Demoni is an 86 y/o female who presents to OP PT 24 days s/p R TKA on 01/04/20. She has completed a HH PT episode as of  01/25/20. She arrives to PT ambulating with 2-wheel RW with poor control of RW (she states she uses a rollator at home) and antalgic step-through gait pattern with decreased weight shift to and decreased stance time on R LE as well as decreased hip and knee flexion. She reports "quite a bit of pain" at time, which still interferes with her sleep. Deficits include R knee pain, mild warmth and edema in R knee with old blood drainage from mid/lower incision, limited R knee AROM (6-96 dg) and PROM (4-104 dg), mild R LE weakness with minimal to no quad lag, and limited gait tolerance with antalgic gait pattern and dependence on AD. Maryella will benefit from skilled PT intervention to address the above listed deficits, reduce pain, and restore functional ROM and strength to allow for improved knee stability for improved balance and gait to maximize function and safety with mobility within home and community.    Personal Factors and Comorbidities Age;Comorbidity 3+;Past/Current Experience;Time since onset of injury/illness/exacerbation    Comorbidities OA,  osteopenia, polymyalgia rheumatica, HTN, h/o breast cancer 2013    Examination-Activity Limitations Bathing;Bed Mobility;Bend;Locomotion Level;Sit;Sleep;Squat;Stairs;Stand;Toileting    Examination-Participation Restrictions Cleaning;Community Activity;Driving;Shop    Stability/Clinical Decision Making Evolving/Moderate complexity    Clinical Decision Making Moderate    Rehab Potential Good    PT Frequency 3x / week    PT Duration 4 weeks    PT Treatment/Interventions ADLs/Self Care Home Management;Cryotherapy;Electrical Stimulation;Iontophoresis 4mg /ml Dexamethasone;Moist Heat;DME Instruction;Gait training;Stair training;Functional mobility training;Therapeutic activities;Therapeutic exercise;Balance training;Neuromuscular re-education;Patient/family education;Manual techniques;Scar mobilization;Passive range of motion;Dry needling;Taping;Vasopneumatic  Device;Joint Manipulations    PT Next Visit Plan Review/progress HEP as appropriate; R knee ROM: LE strengthening; manual therap for ROM and pain; modalities including vaso PRN for pain and edema    Consulted and Agree with Plan of Care Patient           Patient will benefit from skilled therapeutic intervention in order to improve the following deficits and impairments:  Abnormal gait, Decreased activity tolerance, Decreased balance, Decreased endurance, Decreased knowledge of precautions, Decreased knowledge of use of DME, Decreased mobility, Decreased range of motion, Decreased safety awareness, Decreased skin integrity, Decreased scar mobility, Decreased strength, Difficulty walking, Increased edema, Increased fascial restricitons, Increased muscle spasms, Impaired perceived functional ability, Impaired flexibility, Pain  Visit Diagnosis: Stiffness of right knee, not elsewhere classified  Acute pain of right knee  Muscle weakness (generalized)  Other abnormalities of gait and mobility  Difficulty in walking, not elsewhere classified  Unsteadiness on feet     Problem List Patient Active Problem List   Diagnosis Date Noted  . Osteoarthritis of right knee 01/04/2020    Percival Spanish, PT, MPT 01/28/2020, 7:47 PM  [facility] [address]  [place], [place], [address] Phone: [phone]   Fax:  [phone]  Name: SATCHA STORLIE MRN: [id number] Date of Birth: Oct 19, 1929

## 2020-01-30 ENCOUNTER — Ambulatory Visit: Payer: Medicare Other

## 2020-01-30 ENCOUNTER — Other Ambulatory Visit: Payer: Self-pay

## 2020-01-30 DIAGNOSIS — M25661 Stiffness of right knee, not elsewhere classified: Secondary | ICD-10-CM

## 2020-01-30 DIAGNOSIS — R2681 Unsteadiness on feet: Secondary | ICD-10-CM

## 2020-01-30 DIAGNOSIS — M25561 Pain in right knee: Secondary | ICD-10-CM

## 2020-01-30 DIAGNOSIS — R262 Difficulty in walking, not elsewhere classified: Secondary | ICD-10-CM

## 2020-01-30 DIAGNOSIS — R2689 Other abnormalities of gait and mobility: Secondary | ICD-10-CM

## 2020-01-30 DIAGNOSIS — M6281 Muscle weakness (generalized): Secondary | ICD-10-CM

## 2020-01-30 NOTE — Therapy (Signed)
[facility] [address]  [place], [place], [address] Phone: [phone]   Fax:  [phone]  Physical Therapy Treatment  Patient Details  Name: [name] MRN: [id number] Date of Birth: [birth year] Referring Provider (PT): R. Hart Robinsons, MD   Encounter Date: 01/30/2020   PT End of Session - 01/30/20 1338    Visit Number 2    Number of Visits 12    Date for PT Re-Evaluation 02/25/20    Authorization Type UHC Medicare    PT Start Time 1315    PT Stop Time 1407    PT Time Calculation (min) 52 min    Activity Tolerance Patient tolerated treatment well    Behavior During Therapy [facility] for tasks assessed/performed           Past Medical History:  Diagnosis Date  . Arthritis   . Cancer ([place])    breast Ca. on 2013  . GERD (gastroesophageal reflux disease)   . Hypertension   . Pneumonia   . Polymyalgia rheumatica ([place])   . Seizures ([place])     Past Surgical History:  Procedure Laterality Date  . ABDOMINAL HYSTERECTOMY    . BREAST SURGERY    . TONSILLECTOMY    . TOTAL KNEE ARTHROPLASTY Right 01/04/2020   Procedure: TOTAL KNEE ARTHROPLASTY;  Surgeon: Sydnee Cabal, MD;  Location: [facility];  Service: Orthopedics;  Laterality: Right;  adductor canal    There were no vitals filed for this visit.   Subjective Assessment - 01/30/20 1327    Subjective Pt. reporting she has a bandage over the bottom of her R knee incision which has had some leakage.    Pertinent History R TKA 01/04/20    Patient Stated Goals "walk w/o pain in my leg, esp my ankle"    Currently in Pain? No/denies    Pain Score 0-No pain   up to a 7/10 pain at most   Pain Location Knee    Pain Orientation Right    Pain Descriptors / Indicators Dull;Aching    Pain Type Acute pain;Surgical pain    Pain Onset 1 to 4 weeks ago    Pain Frequency Intermittent    Multiple Pain Sites No                             [facility]  Adult PT Treatment/Exercise - 01/30/20 0001      Self-Care   Self-Care Other Self-Care Comments    Other Self-Care Comments  inspection of R knee incision to check for signs of infection; no visible signs of infection however small yellow area in center of distal incision.        Knee/Hip Exercises: Stretches   Passive Hamstring Stretch Right;30 seconds;1 rep    Passive Hamstring Stretch Limitations supine with strap    Hip Flexor Stretch Right;1 rep;30 seconds    Hip Flexor Stretch Limitations mod thomas position       Knee/Hip Exercises: Aerobic   Nustep Lvl 3, 6 min (UE/LE)      Knee/Hip Exercises: Standing   Knee Flexion Right;Left;10 reps;Strengthening    Knee Flexion Limitations holding onto 2 ski poles       Knee/Hip Exercises: Supine   Bridges Both;10 reps    Straight Leg Raises Right;10 reps;Strengthening    Straight Leg Raises Limitations no visible quad       Modalities   Modalities Vasopneumatic  Vasopneumatic   Number Minutes Vasopneumatic  10 minutes    Vasopnuematic Location  Knee    Vasopneumatic Pressure Low    Vasopneumatic Temperature  coldest temp.                        PT Long Term Goals - 01/30/20 1339      PT LONG TERM GOAL #1   Title Patient to be independent with advanced HEP    Status On-going      PT LONG TERM GOAL #2   Title Patient will demonstrate R knee AROM >/= 2-120 dg to allow for normal gait and stair mechanics    Status On-going      PT LONG TERM GOAL #3   Title Patient will demonstrate improved B hip and knee strength to >/= 4+/5 for improved stability and ease of mobility    Status On-going      PT LONG TERM GOAL #4   Title Patient will ambulate with normal gait pattern with or w/o SPC or LRAD    Status On-going      PT LONG TERM GOAL #5   Title Patient will negotiate stairs reciprocally with normal step pattern w/o limitation due to R knee pain or weakness    Status On-going                 Plan  - 01/30/20 1342    Clinical Impression Statement [name] reporting she has been covering/bandaging a small open wound on her lower R knee incision.  She notes this wound has been present since performing the heel slide HH PT activity on her first week home since surgery.  Wound appears to be healing well.  Tolerated all gentle R knee ROM and strengthening activities well without typical pain increase which subsides with rest.  Pt. able to demo SLR exercise with no visible quad lag.  Gait training focusing on improving pt. step-length, sequencing with SPC as pt. has SPC at home and instructed to bring this with her to [place] PT session.  Ended visit with ice/compression to R knee to reduce post-exercise swelling and pain.    Comorbidities OA, osteopenia, polymyalgia rheumatica, HTN, h/o breast cancer 2013    Rehab Potential Good    PT Frequency 3x / week    PT Duration 4 weeks    PT Treatment/Interventions ADLs/Self Care Home Management;Cryotherapy;Electrical Stimulation;Iontophoresis 4mg /ml Dexamethasone;Moist Heat;DME Instruction;Gait training;Stair training;Functional mobility training;Therapeutic activities;Therapeutic exercise;Balance training;Neuromuscular re-education;Patient/family education;Manual techniques;Scar mobilization;Passive range of motion;Dry needling;Taping;Vasopneumatic Device;Joint Manipulations    PT Next Visit Plan R knee ROM: LE strengthening; manual therap for ROM and pain; modalities including vaso PRN for pain and edema    Consulted and Agree with Plan of Care Patient           Patient will benefit from skilled therapeutic intervention in order to improve the following deficits and impairments:  Abnormal gait, Decreased activity tolerance, Decreased balance, Decreased endurance, Decreased knowledge of precautions, Decreased knowledge of use of DME, Decreased mobility, Decreased range of motion, Decreased safety awareness, Decreased skin integrity, Decreased scar mobility,  Decreased strength, Difficulty walking, Increased edema, Increased fascial restricitons, Increased muscle spasms, Impaired perceived functional ability, Impaired flexibility, Pain  Visit Diagnosis: Stiffness of right knee, not elsewhere classified  Acute pain of right knee  Muscle weakness (generalized)  Other abnormalities of gait and mobility  Difficulty in walking, not elsewhere classified  Unsteadiness on feet     Problem List Patient Active Problem  List   Diagnosis Date Noted  . Osteoarthritis of right knee 01/04/2020    Bess Harvest, PTA 01/30/20 2:25 PM   [facility] [address]  [place], [place], [address] Phone: [phone]   Fax:  [phone]  Name: ADISON REIFSTECK MRN: [id number] Date of Birth: 1929/11/18

## 2020-01-31 ENCOUNTER — Ambulatory Visit: Payer: Medicare Other | Admitting: Physical Therapy

## 2020-01-31 ENCOUNTER — Other Ambulatory Visit: Payer: Self-pay

## 2020-01-31 ENCOUNTER — Encounter: Payer: Self-pay | Admitting: Physical Therapy

## 2020-01-31 DIAGNOSIS — M6281 Muscle weakness (generalized): Secondary | ICD-10-CM

## 2020-01-31 DIAGNOSIS — M25661 Stiffness of right knee, not elsewhere classified: Secondary | ICD-10-CM | POA: Diagnosis not present

## 2020-01-31 DIAGNOSIS — M25561 Pain in right knee: Secondary | ICD-10-CM

## 2020-01-31 DIAGNOSIS — R262 Difficulty in walking, not elsewhere classified: Secondary | ICD-10-CM

## 2020-01-31 DIAGNOSIS — R2681 Unsteadiness on feet: Secondary | ICD-10-CM

## 2020-01-31 DIAGNOSIS — R2689 Other abnormalities of gait and mobility: Secondary | ICD-10-CM

## 2020-01-31 NOTE — Therapy (Addendum)
[facility] [address]  [place], [place], [address] Phone: [phone]   Fax:  [phone]  Physical Therapy Treatment / Discharge Summary  Patient Details  Name: [name] MRN: [id number] Date of Birth: [birth year] Referring Provider (PT): R. Hart Robinsons, MD   Encounter Date: 01/31/2020   PT End of Session - 01/31/20 1314    Visit Number 3    Number of Visits 12    Date for PT Re-Evaluation 02/25/20    Authorization Type [employer]    PT Start Time 1314    PT Stop Time 1405    PT Time Calculation (min) 51 min    Activity Tolerance Patient tolerated treatment well    Behavior During Therapy [facility] for tasks assessed/performed           Past Medical History:  Diagnosis Date  . Arthritis   . Cancer ([place])    breast Ca. on 2013  . GERD (gastroesophageal reflux disease)   . Hypertension   . Pneumonia   . Polymyalgia rheumatica ([place])   . Seizures ([place])     Past Surgical History:  Procedure Laterality Date  . ABDOMINAL HYSTERECTOMY    . BREAST SURGERY    . TONSILLECTOMY    . TOTAL KNEE ARTHROPLASTY Right 01/04/2020   Procedure: TOTAL KNEE ARTHROPLASTY;  Surgeon: Sydnee Cabal, MD;  Location: [facility];  Service: Orthopedics;  Laterality: Right;  adductor canal    There were no vitals filed for this visit.   Subjective Assessment - 01/31/20 1317    Subjective Pt reports her L knee bothers her more than her R knee at this point. States she was able to sleep through most of the night w/o pain last night.    Pertinent History R TKA 01/04/20    Patient Stated Goals "walk w/o pain in my leg, esp my ankle"    Currently in Pain? No/denies                             [facility] Adult PT Treatment/Exercise - 01/31/20 1314      Ambulation/Gait   Ambulation/Gait Assistance 5: Supervision;4: Min guard    Ambulation/Gait Assistance Details cues for proper placement and sequencing with SPC  in L hand    Assistive device Straight cane;None    Gait Pattern Step-through pattern;Decreased weight shift to right;Decreased stance time - right;Decreased hip/knee flexion - right;Decreased stride length    Ambulation Surface Level;Indoor    Gait Comments Pt not safe to go w/o AD entirely as she fails to suffiiciently clear her toes, but encouraged pt to try [facility] in home and continue to use RW in public.      Exercises   Exercises Knee/Hip      Knee/Hip Exercises: Aerobic   Nustep L3 x 6 min (UE/LE)   seat #7     Knee/Hip Exercises: Standing   Terminal Knee Extension Right;10 reps;Strengthening    Terminal Knee Extension Limitations ball on wall    Hip Abduction Right;Left;10 reps;Stengthening;Knee straight    Abduction Limitations looped yellow TB at ankles - cues to avoid hip ER    Hip Extension Right;Left;10 reps;Stengthening;Knee straight    Extension Limitations looped yellow TB at ankles     Functional Squat 10 reps    Functional Squat Limitations counter squat - cues for posterior wt shift avoiding knees fwd of toes      Knee/Hip  Exercises: Seated   Long Arc Quad Right;10 reps;2 sets;Strengthening    Long Arc Quad Weight 2 lbs.    Long [employer] Limitations 2nd set + hip ADD ball squeeze    Ball Squeeze 10 x 5"    Other Seated Knee/Hip Exercises R Fittter leg press (1 black) x 15    Hamstring Curl Right;10 reps;Strengthening    Hamstring Limitations red TB      Modalities   Modalities Vasopneumatic      Vasopneumatic   Number Minutes Vasopneumatic  10 minutes    Vasopnuematic Location  Knee    Vasopneumatic Pressure Medium    Vasopneumatic Temperature  34                       PT Long Term Goals - 01/31/20 1319      PT LONG TERM GOAL #1   Title Patient to be independent with advanced HEP    Status On-going    Target Date 02/25/20      PT LONG TERM GOAL #2   Title Patient will demonstrate R knee AROM >/= 2-120 dg to allow for normal gait and  stair mechanics    Status On-going    Target Date 02/25/20      PT LONG TERM GOAL #3   Title Patient will demonstrate improved B hip and knee strength to >/= 4+/5 for improved stability and ease of mobility    Status On-going    Target Date 02/25/20      PT LONG TERM GOAL #4   Title Patient will ambulate with normal gait pattern with or w/o SPC or LRAD    Status On-going    Target Date 02/25/20      PT LONG TERM GOAL #5   Title Patient will negotiate stairs reciprocally with normal step pattern w/o limitation due to R knee pain or weakness    Status On-going    Target Date 02/25/20                 Plan - 01/31/20 1320    Clinical Impression Statement [name] reports her pain has been improving and she was able to sleep through most of the night last night w/o pain interference. She is anxious to be done with the RW, therefore provided gait training with SPC and w/o AD - she notes difficulty sequencing SPC in L hand as she is R-handed but is unstable with inconsistent foot clearance w/o AD and unsafe to walk w/o AD. Encouraged pt to practice with [facility] in home for now and use RW for community access until more confident with SPC. Continued progression of R knee ROM and strengthening exercises with good tolerance other than need for intermittent seated rest breaks during standing activities due to fatigue.    Comorbidities OA, osteopenia, polymyalgia rheumatica, HTN, h/o breast cancer 2013    Rehab Potential Good    PT Frequency 3x / week    PT Duration 4 weeks    PT Treatment/Interventions ADLs/Self Care Home Management;Cryotherapy;Electrical Stimulation;Iontophoresis 71m/ml Dexamethasone;Moist Heat;DME Instruction;Gait training;Stair training;Functional mobility training;Therapeutic activities;Therapeutic exercise;Balance training;Neuromuscular re-education;Patient/family education;Manual techniques;Scar mobilization;Passive range of motion;Dry needling;Taping;Vasopneumatic  Device;Joint Manipulations    PT Next Visit Plan R knee ROM: LE strengthening; manual therapy for ROM and pain; modalities including vaso PRN for pain and edema    Consulted and Agree with Plan of Care Patient           Patient will benefit from skilled therapeutic intervention  in order to improve the following deficits and impairments:  Abnormal gait, Decreased activity tolerance, Decreased balance, Decreased endurance, Decreased knowledge of precautions, Decreased knowledge of use of DME, Decreased mobility, Decreased range of motion, Decreased safety awareness, Decreased skin integrity, Decreased scar mobility, Decreased strength, Difficulty walking, Increased edema, Increased fascial restricitons, Increased muscle spasms, Impaired perceived functional ability, Impaired flexibility, Pain  Visit Diagnosis: Stiffness of right knee, not elsewhere classified  Acute pain of right knee  Muscle weakness (generalized)  Other abnormalities of gait and mobility  Difficulty in walking, not elsewhere classified  Unsteadiness on feet     Problem List Patient Active Problem List   Diagnosis Date Noted  . Osteoarthritis of right knee 01/04/2020    Percival Spanish, PT, MPT 01/31/2020, 6:51 PM  [facility] [address]  [place], [place], [address] Phone: [phone]   Fax:  [phone]  Name: [name] MRN: [id number] Date of Birth: January 03, 1930   PHYSICAL THERAPY DISCHARGE SUMMARY  Visits from Start of Care: 3  Current functional level related to goals / functional outcomes:   Refer to above clinical impression for status as of last visit on 01/31/2020. Patient experienced a fall on 02/05/2020 resulting in a vertebral compression fracture and has been unable to return to PT in >30 days, therefore will proceed with discharge from PT for this episode.   Remaining deficits:   As above.    Education / Equipment:    HEP  Plan: Patient agrees to discharge.  Patient goals were not met. Patient is being discharged due to a change in medical status.  ?????     Percival Spanish, PT, MPT 03/06/20, 10:43 AM  [facility] [address]  [place], [place], [address] Phone: [phone]   Fax:  [phone]

## 2020-02-05 ENCOUNTER — Ambulatory Visit: Payer: Medicare Other | Admitting: Physical Therapy

## 2020-02-05 ENCOUNTER — Other Ambulatory Visit: Payer: Self-pay

## 2020-02-05 ENCOUNTER — Encounter (HOSPITAL_BASED_OUTPATIENT_CLINIC_OR_DEPARTMENT_OTHER): Payer: Self-pay | Admitting: Emergency Medicine

## 2020-02-05 ENCOUNTER — Emergency Department (HOSPITAL_BASED_OUTPATIENT_CLINIC_OR_DEPARTMENT_OTHER)
Admission: EM | Admit: 2020-02-05 | Discharge: 2020-02-06 | Disposition: A | Discharge status: Home / Self Care | Payer: Medicare Other | Attending: Emergency Medicine | Admitting: Emergency Medicine

## 2020-02-05 DIAGNOSIS — Z7951 Long term (current) use of inhaled steroids: Secondary | ICD-10-CM | POA: Diagnosis not present

## 2020-02-05 DIAGNOSIS — Y9389 Activity, other specified: Secondary | ICD-10-CM | POA: Insufficient documentation

## 2020-02-05 DIAGNOSIS — M26629 Arthralgia of temporomandibular joint, unspecified side: Secondary | ICD-10-CM | POA: Insufficient documentation

## 2020-02-05 DIAGNOSIS — S0990XA Unspecified injury of head, initial encounter: Secondary | ICD-10-CM | POA: Diagnosis not present

## 2020-02-05 DIAGNOSIS — Z7982 Long term (current) use of aspirin: Secondary | ICD-10-CM | POA: Diagnosis not present

## 2020-02-05 DIAGNOSIS — Z853 Personal history of malignant neoplasm of breast: Secondary | ICD-10-CM | POA: Insufficient documentation

## 2020-02-05 DIAGNOSIS — I700 Atherosclerosis of aorta: Secondary | ICD-10-CM | POA: Insufficient documentation

## 2020-02-05 DIAGNOSIS — S3981XA Other specified injuries of abdomen, initial encounter: Secondary | ICD-10-CM | POA: Insufficient documentation

## 2020-02-05 DIAGNOSIS — S32000S Wedge compression fracture of unspecified lumbar vertebra, sequela: Secondary | ICD-10-CM

## 2020-02-05 DIAGNOSIS — Z79899 Other long term (current) drug therapy: Secondary | ICD-10-CM | POA: Insufficient documentation

## 2020-02-05 DIAGNOSIS — Z96651 Presence of right artificial knee joint: Secondary | ICD-10-CM | POA: Diagnosis not present

## 2020-02-05 DIAGNOSIS — Y998 Other external cause status: Secondary | ICD-10-CM | POA: Insufficient documentation

## 2020-02-05 DIAGNOSIS — W19XXXA Unspecified fall, initial encounter: Secondary | ICD-10-CM | POA: Diagnosis not present

## 2020-02-05 DIAGNOSIS — I10 Essential (primary) hypertension: Secondary | ICD-10-CM | POA: Diagnosis not present

## 2020-02-05 DIAGNOSIS — Y9289 Other specified places as the place of occurrence of the external cause: Secondary | ICD-10-CM | POA: Diagnosis not present

## 2020-02-05 NOTE — ED Triage Notes (Signed)
Patient arrived via EMS c/o fall at approximately 0900. Patient had recent knee replacement. Patient struck tailbone, right hip and back. Patient denies LOC or striking head. Patient attempted pain control at home taking robaxin, oxycotin, and tylenol at approximately 1730 with no relief. Patient is AO x 4, VS WDL, no gait.

## 2020-02-06 ENCOUNTER — Emergency Department (HOSPITAL_BASED_OUTPATIENT_CLINIC_OR_DEPARTMENT_OTHER): Payer: Medicare Other

## 2020-02-06 ENCOUNTER — Encounter (HOSPITAL_BASED_OUTPATIENT_CLINIC_OR_DEPARTMENT_OTHER): Payer: Self-pay | Admitting: Emergency Medicine

## 2020-02-06 LAB — CBC WITH DIFFERENTIAL/PLATELET
Abs Immature Granulocytes: 0.04 10*3/uL (ref 0.00–0.07)
Basophils Absolute: 0 10*3/uL (ref 0.0–0.1)
Basophils Relative: 0 %
Eosinophils Absolute: 0 10*3/uL (ref 0.0–0.5)
Eosinophils Relative: 0 %
HCT: 37.2 % (ref 36.0–46.0)
Hemoglobin: 11.5 g/dL — ABNORMAL LOW (ref 12.0–15.0)
Immature Granulocytes: 0 %
Lymphocytes Relative: 31 %
Lymphs Abs: 3.9 10*3/uL (ref 0.7–4.0)
MCH: 28.1 pg (ref 26.0–34.0)
MCHC: 30.9 g/dL (ref 30.0–36.0)
MCV: 91 fL (ref 80.0–100.0)
Monocytes Absolute: 0.9 10*3/uL (ref 0.1–1.0)
Monocytes Relative: 7 %
Neutro Abs: 7.7 10*3/uL (ref 1.7–7.7)
Neutrophils Relative %: 62 %
Platelets: 252 10*3/uL (ref 150–400)
RBC: 4.09 MIL/uL (ref 3.87–5.11)
RDW: 14.5 % (ref 11.5–15.5)
WBC: 12.6 10*3/uL — ABNORMAL HIGH (ref 4.0–10.5)
nRBC: 0 % (ref 0.0–0.2)

## 2020-02-06 LAB — COMPREHENSIVE METABOLIC PANEL
ALT: 24 U/L (ref 0–44)
AST: 24 U/L (ref 15–41)
Albumin: 4.3 g/dL (ref 3.5–5.0)
Alkaline Phosphatase: 99 U/L (ref 38–126)
Anion gap: 11 (ref 5–15)
BUN: 13 mg/dL (ref 8–23)
CO2: 26 mmol/L (ref 22–32)
Calcium: 9.3 mg/dL (ref 8.9–10.3)
Chloride: 99 mmol/L (ref 98–111)
Creatinine, Ser: 0.67 mg/dL (ref 0.44–1.00)
GFR calc Af Amer: >60 mL/min (ref >60)
GFR calc non Af Amer: >60 mL/min (ref >60)
Glucose, Bld: 123 mg/dL — ABNORMAL HIGH (ref 70–99)
Potassium: 3.5 mmol/L (ref 3.5–5.1)
Sodium: 136 mmol/L (ref 135–145)
Total Bilirubin: 0.6 mg/dL (ref 0.3–1.2)
Total Protein: 7 g/dL (ref 6.5–8.1)

## 2020-02-06 LAB — URINALYSIS, MICROSCOPIC (REFLEX)

## 2020-02-06 LAB — URINALYSIS, ROUTINE W REFLEX MICROSCOPIC
Bilirubin Urine: NEGATIVE
Glucose, UA: NEGATIVE mg/dL
Ketones, ur: NEGATIVE mg/dL
Leukocytes,Ua: NEGATIVE
Nitrite: NEGATIVE
Protein, ur: NEGATIVE mg/dL
Specific Gravity, Urine: 1.01 (ref 1.005–1.030)
pH: 7 (ref 5.0–8.0)

## 2020-02-06 MED ORDER — LIDOCAINE 5 % EX PTCH: 1.0000 | MEDICATED_PATCH | CUTANEOUS | 0 refills | Status: DC

## 2020-02-06 MED ORDER — LIDOCAINE 5 % EX PTCH
3.0000 | MEDICATED_PATCH | CUTANEOUS | Status: DC
  Administered 2020-02-06: 3 via TRANSDERMAL
  Filled 2020-02-06: qty 3

## 2020-02-06 MED ORDER — KETOROLAC TROMETHAMINE 30 MG/ML IJ SOLN
30.0000 mg | Freq: Once | INTRAMUSCULAR | Status: AC
  Administered 2020-02-06: 30 mg via INTRAVENOUS
  Filled 2020-02-06: qty 1

## 2020-02-06 MED ORDER — DICLOFENAC SODIUM ER 100 MG PO TB24: 100.0000 mg | ORAL_TABLET | Freq: Every day | ORAL | 0 refills | Status: DC

## 2020-02-06 NOTE — ED Notes (Signed)
Waiting on [facility] transport to house.

## 2020-02-06 NOTE — ED Notes (Signed)
This Probation officer called for transport home @ [phone]

## 2020-02-06 NOTE — ED Provider Notes (Addendum)
[place] EMERGENCY DEPARTMENT Provider Note   CSN: [id number] Arrival date & time: 02/05/20  2330     History Chief Complaint  Patient presents with  . Fall    [name] is a 86 y.o. female.  The history is provided by the patient.  Fall This is a new problem. The current episode started 12 to 24 hours ago. The problem occurs rarely. The problem has been resolved. Pertinent negatives include no chest pain, no abdominal pain, no headaches and no shortness of breath. Nothing aggravates the symptoms. Nothing relieves the symptoms. She has tried nothing for the symptoms. The treatment provided no relief.       Past Medical History:  Diagnosis Date  . Arthritis   . Cancer ([place])    breast Ca. on 2013  . GERD (gastroesophageal reflux disease)   . Hypertension   . Pneumonia   . Polymyalgia rheumatica ([place])   . Seizures [facility])     Patient Active Problem List   Diagnosis Date Noted  . Osteoarthritis of right knee 01/04/2020    Past Surgical History:  Procedure Laterality Date  . ABDOMINAL HYSTERECTOMY    . BREAST SURGERY    . TONSILLECTOMY    . TOTAL KNEE ARTHROPLASTY Right 01/04/2020   Procedure: TOTAL KNEE ARTHROPLASTY;  Surgeon: Sydnee Cabal, MD;  Location: [facility] ORS;  Service: Orthopedics;  Laterality: Right;  adductor canal     OB History   No obstetric history on file.     History reviewed. No pertinent family history.  Social History   Tobacco Use  . Smoking status: Never Smoker  . Smokeless tobacco: Never Used  Vaping Use  . Vaping Use: Never used  Substance Use Topics  . Alcohol use: Never  . Drug use: Never    Home Medications Prior to Admission medications   Medication Sig Start Date End Date Taking? Authorizing Provider  acetaminophen (TYLENOL) 500 MG tablet Take 2 tablets (1,000 mg total) by mouth every 6 (six) hours as needed. Patient taking differently: Take 1,000 mg by mouth every 6 (six) hours as needed for moderate  pain.  07/09/18   Charlesetta Shanks, MD  albuterol (VENTOLIN HFA) 108 (90 Base) MCG/ACT inhaler Inhale 1-2 puffs into the lungs every 6 (six) hours as needed for wheezing or shortness of breath.    [provider]  amLODipine (NORVASC) 5 MG tablet Take 5 mg by mouth daily.  07/03/16   [provider]  aspirin EC 325 MG tablet Take 1 tablet (325 mg total) by mouth in the morning and at bedtime. 01/05/20 02/16/20  Maurice March, PA-C  escitalopram (LEXAPRO) 10 MG tablet Take 10 mg by mouth daily.    [provider]  ezetimibe (ZETIA) 10 MG tablet Take 10 mg by mouth daily.    [provider]  famotidine (PEPCID) 40 MG tablet Take 40 mg by mouth at bedtime.     [provider]  fluticasone (FLONASE) 50 MCG/ACT nasal spray Place 1 spray into the nose daily as needed for allergies.  02/03/18   [provider]  gabapentin (NEURONTIN) 300 MG capsule Take 1 capsule (300 mg total) by mouth 3 (three) times daily. Take a 300 mg capsule three times a day for two weeks following surgery.Then take a 300 mg capsule two times a day for two weeks. Then take a 300 mg capsule once a day for two weeks. Then discontinue the Gabapentin. 01/05/20   Maurice March, PA-C  hydrochlorothiazide (HYDRODIURIL) 12.5 MG tablet Take 12.5 mg by mouth daily.    [provider]  methocarbamol (ROBAXIN) 500 MG tablet Take 1 tablet (500 mg total) by mouth every 6 (six) hours as needed for muscle spasms. 01/05/20   Maurice March, PA-C  Multiple Vitamin (MULTIVITAMIN WITH MINERALS) TABS tablet Take 1 tablet by mouth daily.    [provider]  oxyCODONE (OXY IR/ROXICODONE) 5 MG immediate release tablet Take 1-2 tablets (5-10 mg total) by mouth every 6 (six) hours as needed for moderate pain (pain score 4-6). 01/05/20   Maurice March, PA-C  pantoprazole (PROTONIX) 40 MG tablet Take 40 mg by mouth daily.  01/14/14   [provider]  PHENobarbital (LUMINAL) 97.2  MG tablet Take 97.2 mg by mouth at bedtime.  05/17/16   [provider]  predniSONE (DELTASONE) 5 MG tablet Take 5 mg by mouth daily with breakfast.  02/22/18   [provider]    Allergies    Sulfa antibiotics, Penicillins, and Simvastatin  Review of Systems   Review of Systems  Constitutional: Negative for fever.  HENT: Negative for congestion.   Eyes: Negative for visual disturbance.  Respiratory: Negative for shortness of breath.   Cardiovascular: Negative for chest pain.  Gastrointestinal: Negative for abdominal pain.  Genitourinary: Negative for difficulty urinating.  Musculoskeletal: Positive for arthralgias.  Neurological: Negative for headaches.  Psychiatric/Behavioral: Negative for agitation.  All other systems reviewed and are negative.   Physical Exam Updated Vital Signs BP 140/72 (BP Location: Right Arm)   Pulse 98   Temp 98.7 F (37.1 C) (Oral)   Resp (!) 22   Ht 5\' 2"  (1.575 m)   Wt 65.8 kg   LMP  (LMP Unknown)   SpO2 98%   BMI 26.52 kg/m   Physical Exam Vitals and nursing note reviewed.  Constitutional:      General: She is not in acute distress.    Appearance: Normal appearance.  HENT:     Head: Normocephalic and atraumatic.     Nose: Nose normal.  Eyes:     Conjunctiva/sclera: Conjunctivae normal.     Pupils: Pupils are equal, round, and reactive to light.  Cardiovascular:     Rate and Rhythm: Normal rate and regular rhythm.     Pulses: Normal pulses.     Heart sounds: Normal heart sounds.  Pulmonary:     Effort: Pulmonary effort is normal.     Breath sounds: Normal breath sounds.  Abdominal:     General: Abdomen is flat. Bowel sounds are normal.     Palpations: Abdomen is soft.     Tenderness: There is no abdominal tenderness. There is no guarding or rebound.  Musculoskeletal:        General: No deformity. Normal range of motion.     Cervical back: Normal range of motion and neck supple.     Right hip: Normal.     Left  hip: Normal.     Right knee: Normal.     Left knee: Normal.     Right lower leg: Normal.     Left lower leg: Normal.     Right ankle: Normal.     Right Achilles Tendon: Normal.     Left ankle: Normal.     Left Achilles Tendon: Normal.     Right foot: Normal. No swelling.     Left foot: Normal. No swelling.  Skin:    General: Skin is warm and dry.  Capillary Refill: Capillary refill takes less than 2 seconds.  Neurological:     General: No focal deficit present.     Mental Status: She is alert and oriented to person, place, and time.     Cranial Nerves: No cranial nerve deficit.     Deep Tendon Reflexes: Reflexes normal.  Psychiatric:        Mood and Affect: Mood normal.        Behavior: Behavior normal.     ED Results / Procedures / Treatments   Labs (all labs ordered are listed, but only abnormal results are displayed) Results for orders placed or performed during the hospital encounter of 02/05/20  Urinalysis, Routine w reflex microscopic Urine, Catheterized  Result Value Ref Range   Color, Urine YELLOW YELLOW   APPearance CLEAR CLEAR   Specific Gravity, Urine 1.010 1.005 - 1.030   pH 7.0 5.0 - 8.0   Glucose, UA NEGATIVE NEGATIVE mg/dL   Hgb urine dipstick TRACE (A) NEGATIVE   Bilirubin Urine NEGATIVE NEGATIVE   Ketones, ur NEGATIVE NEGATIVE mg/dL   Protein, ur NEGATIVE NEGATIVE mg/dL   Nitrite NEGATIVE NEGATIVE   Leukocytes,Ua NEGATIVE NEGATIVE  CBC with Differential  Result Value Ref Range   WBC 12.6 (H) 4.0 - 10.5 K/uL   RBC 4.09 3.87 - 5.11 MIL/uL   Hemoglobin 11.5 (L) 12.0 - 15.0 g/dL   HCT 37.2 36 - 46 %   MCV 91.0 80.0 - 100.0 fL   MCH 28.1 26.0 - 34.0 pg   MCHC 30.9 30.0 - 36.0 g/dL   RDW 14.5 11.5 - 15.5 %   Platelets 252 150 - 400 K/uL   nRBC 0.0 0.0 - 0.2 %   Neutrophils Relative % 62 %   Neutro Abs 7.7 1.7 - 7.7 K/uL   Lymphocytes Relative 31 %   Lymphs Abs 3.9 0.7 - 4.0 K/uL   Monocytes Relative 7 %   Monocytes Absolute 0.9 0 - 1 K/uL    Eosinophils Relative 0 %   Eosinophils Absolute 0.0 0 - 0 K/uL   Basophils Relative 0 %   Basophils Absolute 0.0 0 - 0 K/uL   Immature Granulocytes 0 %   Abs Immature Granulocytes 0.04 0.00 - 0.07 K/uL  Comprehensive metabolic panel  Result Value Ref Range   Sodium 136 135 - 145 mmol/L   Potassium 3.5 3.5 - 5.1 mmol/L   Chloride 99 98 - 111 mmol/L   CO2 26 22 - 32 mmol/L   Glucose, Bld 123 (H) 70 - 99 mg/dL   BUN 13 8 - 23 mg/dL   Creatinine, Ser 0.67 0.44 - 1.00 mg/dL   Calcium 9.3 8.9 - 10.3 mg/dL   Total Protein 7.0 6.5 - 8.1 g/dL   Albumin 4.3 3.5 - 5.0 g/dL   AST 24 15 - 41 U/L   ALT 24 0 - 44 U/L   Alkaline Phosphatase 99 38 - 126 U/L   Total Bilirubin 0.6 0.3 - 1.2 mg/dL   GFR calc non Af Amer >60 >60 mL/min   GFR calc Af Amer >60 >60 mL/min   Anion gap 11 5 - 15  Urinalysis, Microscopic (reflex)  Result Value Ref Range   RBC / HPF 0-5 0 - 5 RBC/hpf   WBC, UA 0-5 0 - 5 WBC/hpf   Bacteria, UA RARE (A) NONE SEEN   Squamous Epithelial / LPF 0-5 0 - 5   CT ABDOMEN PELVIS WO CONTRAST  Result Date: 02/06/2020 CLINICAL DATA:  [place], struck  tailbone, right hip, and back. EXAM: CT CHEST, ABDOMEN AND PELVIS WITHOUT CONTRAST TECHNIQUE: Multidetector CT imaging of the chest, abdomen and pelvis was performed following the standard protocol without IV contrast. Unenhanced CT was performed per clinician order. Lack of IV contrast limits sensitivity and specificity, especially for evaluation of abdominal/pelvic solid viscera. COMPARISON:  03/12/2019 FINDINGS: CT CHEST FINDINGS Cardiovascular: Unenhanced imaging of the heart and great vessels demonstrates no pericardial effusion. Mild atherosclerosis of the thoracic aorta and coronary vessels. Mediastinum/Nodes: No enlarged mediastinal, hilar, or axillary lymph nodes. Thyroid gland, trachea, and esophagus demonstrate no significant findings. Lungs/Pleura: No acute airspace disease, effusion, or pneumothorax. Minimal hypoventilatory changes at  the lung bases. Central airways are patent. Musculoskeletal: Postsurgical changes from right mastectomy. There are prior healed left-sided rib fractures. Anterior wedge compression deformity of the T11 vertebral body is noted, new since 03/12/2019. There is approximately 50% loss of height anteriorly, with marked sclerosis along the inferior endplate. No acute fracture line is identified, and there is no paraspinal hematoma. This is likely a subacute or chronic fracture. Reconstructed images demonstrate no additional findings. CT ABDOMEN PELVIS FINDINGS Hepatobiliary: No focal liver abnormality is seen. No gallstones, gallbladder wall thickening, or biliary dilatation. Pancreas: Unremarkable. No pancreatic ductal dilatation or surrounding inflammatory changes. Spleen: Normal in size without focal abnormality. Adrenals/Urinary Tract: Punctate 2 mm nonobstructing calculus right kidney image 55. No evidence of renal injury on this unenhanced exam. The bladder is grossly normal. The adrenals are unremarkable. Stomach/Bowel: No bowel obstruction or ileus. No wall thickening or inflammatory change. Normal appendix right lower quadrant. Small hiatal hernia. Vascular/Lymphatic: Aortic atherosclerosis. No enlarged abdominal or pelvic lymph nodes. Reproductive: Status post hysterectomy. No adnexal masses. Other: No free fluid or free gas. Small fat containing umbilical hernia. No bowel herniation. Musculoskeletal: There is subtle cortical discontinuity through the inferior endplate of the L3 vertebral body, with no significant loss of height. Findings could reflect an acute or subacute fracture. No other acute bony abnormalities. Reconstructed images demonstrate no additional findings. IMPRESSION: 1. Subtle cortical discontinuity through the inferior endplate of the L3 vertebral body, with no significant loss of height. Findings could reflect an acute or subacute fracture. 2. Chronic appearing T11 compression deformity,  though new since previous exam 03/12/2019. 3. Otherwise no acute intrathoracic, intra-abdominal, or intrapelvic trauma on this unenhanced exam. 4. Punctate 2 mm nonobstructing right renal calculus. 5. Small hiatal hernia. 6. Aortic Atherosclerosis (ICD10-I70.0). Electronically Signed   By: Randa Ngo M.D.   On: 02/06/2020 03:21   DG Lumbar Spine Complete  Result Date: 02/06/2020 CLINICAL DATA:  Pain and fall EXAM: LUMBAR SPINE - COMPLETE 4+ VIEW COMPARISON:  None. FINDINGS: There is an anterior wedge compression deformity of the T11 vertebral body with approximately 50% loss in anterior vertebral body height. This is new from the prior exam of March 12, 2019. No retropulsion of fragments is seen. Degenerative changes seen in the lower lumbar spine with disc height loss and facet arthrosis. Scattered vascular calcifications are noted. IMPRESSION: Age indeterminate, however new since prior exam of March 12, 2019, anterior wedge compression deformity of the T11 vertebral body with approximately 50% loss in vertebral body height. Electronically Signed   By: Prudencio Pair M.D.   On: 02/06/2020 00:45   CT Head Wo Contrast  Result Date: 02/06/2020 CLINICAL DATA:  Fall EXAM: CT HEAD WITHOUT CONTRAST TECHNIQUE: Contiguous axial images were obtained from the base of the skull through the vertex without intravenous contrast. COMPARISON:  July 09, 2018  FINDINGS: Brain: No evidence of acute territorial infarction, hemorrhage, hydrocephalus,extra-axial collection or mass lesion/mass effect. There is dilatation the ventricles and sulci consistent with age-related atrophy. Low-attenuation changes in the deep white matter consistent with small vessel ischemia. Vascular: No hyperdense vessel or unexpected calcification. Skull: The skull is intact. No fracture or focal lesion identified. Sinuses/Orbits: The visualized paranasal sinuses and mastoid air cells are clear. The orbits and globes intact. Other: None IMPRESSION:  No acute intracranial abnormality. Findings consistent with age related atrophy and chronic small vessel ischemia Electronically Signed   By: Prudencio Pair M.D.   On: 02/06/2020 00:54   CT CHEST WO CONTRAST  Result Date: 02/06/2020 CLINICAL DATA:  [place], struck tailbone, right hip, and back. EXAM: CT CHEST, ABDOMEN AND PELVIS WITHOUT CONTRAST TECHNIQUE: Multidetector CT imaging of the chest, abdomen and pelvis was performed following the standard protocol without IV contrast. Unenhanced CT was performed per clinician order. Lack of IV contrast limits sensitivity and specificity, especially for evaluation of abdominal/pelvic solid viscera. COMPARISON:  03/12/2019 FINDINGS: CT CHEST FINDINGS Cardiovascular: Unenhanced imaging of the heart and great vessels demonstrates no pericardial effusion. Mild atherosclerosis of the thoracic aorta and coronary vessels. Mediastinum/Nodes: No enlarged mediastinal, hilar, or axillary lymph nodes. Thyroid gland, trachea, and esophagus demonstrate no significant findings. Lungs/Pleura: No acute airspace disease, effusion, or pneumothorax. Minimal hypoventilatory changes at the lung bases. Central airways are patent. Musculoskeletal: Postsurgical changes from right mastectomy. There are prior healed left-sided rib fractures. Anterior wedge compression deformity of the T11 vertebral body is noted, new since 03/12/2019. There is approximately 50% loss of height anteriorly, with marked sclerosis along the inferior endplate. No acute fracture line is identified, and there is no paraspinal hematoma. This is likely a subacute or chronic fracture. Reconstructed images demonstrate no additional findings. CT ABDOMEN PELVIS FINDINGS Hepatobiliary: No focal liver abnormality is seen. No gallstones, gallbladder wall thickening, or biliary dilatation. Pancreas: Unremarkable. No pancreatic ductal dilatation or surrounding inflammatory changes. Spleen: Normal in size without focal abnormality.  Adrenals/Urinary Tract: Punctate 2 mm nonobstructing calculus right kidney image 55. No evidence of renal injury on this unenhanced exam. The bladder is grossly normal. The adrenals are unremarkable. Stomach/Bowel: No bowel obstruction or ileus. No wall thickening or inflammatory change. Normal appendix right lower quadrant. Small hiatal hernia. Vascular/Lymphatic: Aortic atherosclerosis. No enlarged abdominal or pelvic lymph nodes. Reproductive: Status post hysterectomy. No adnexal masses. Other: No free fluid or free gas. Small fat containing umbilical hernia. No bowel herniation. Musculoskeletal: There is subtle cortical discontinuity through the inferior endplate of the L3 vertebral body, with no significant loss of height. Findings could reflect an acute or subacute fracture. No other acute bony abnormalities. Reconstructed images demonstrate no additional findings. IMPRESSION: 1. Subtle cortical discontinuity through the inferior endplate of the L3 vertebral body, with no significant loss of height. Findings could reflect an acute or subacute fracture. 2. Chronic appearing T11 compression deformity, though new since previous exam 03/12/2019. 3. Otherwise no acute intrathoracic, intra-abdominal, or intrapelvic trauma on this unenhanced exam. 4. Punctate 2 mm nonobstructing right renal calculus. 5. Small hiatal hernia. 6. Aortic Atherosclerosis (ICD10-I70.0). Electronically Signed   By: Randa Ngo M.D.   On: 02/06/2020 03:21   DG Hip Unilat W or Wo Pelvis 2-3 Views Right  Result Date: 02/06/2020 CLINICAL DATA:  [place], right hip pain EXAM: DG HIP (WITH OR WITHOUT PELVIS) 2-3V RIGHT COMPARISON:  None. FINDINGS: Frontal view of the pelvis as well as frontal and frogleg lateral views of the  right hip are obtained. There are no acute displaced fractures. The hips are well aligned, with mild symmetrical osteoarthritis noted. Sacroiliac joints are normal. Prominent spondylosis and facet hypertrophy at the  lumbosacral junction. IMPRESSION: 1. Mild symmetrical hip osteoarthritis. No acute displaced fracture. 2. Prominent lower lumbar degenerative changes. Electronically Signed   By: Randa Ngo M.D.   On: 02/06/2020 00:44    Radiology DG Lumbar Spine Complete  Result Date: 02/06/2020 CLINICAL DATA:  Pain and fall EXAM: LUMBAR SPINE - COMPLETE 4+ VIEW COMPARISON:  None. FINDINGS: There is an anterior wedge compression deformity of the T11 vertebral body with approximately 50% loss in anterior vertebral body height. This is new from the prior exam of March 12, 2019. No retropulsion of fragments is seen. Degenerative changes seen in the lower lumbar spine with disc height loss and facet arthrosis. Scattered vascular calcifications are noted. IMPRESSION: Age indeterminate, however new since prior exam of March 12, 2019, anterior wedge compression deformity of the T11 vertebral body with approximately 50% loss in vertebral body height. Electronically Signed   By: Prudencio Pair M.D.   On: 02/06/2020 00:45   CT Head Wo Contrast  Result Date: 02/06/2020 CLINICAL DATA:  Fall EXAM: CT HEAD WITHOUT CONTRAST TECHNIQUE: Contiguous axial images were obtained from the base of the skull through the vertex without intravenous contrast. COMPARISON:  July 09, 2018 FINDINGS: Brain: No evidence of acute territorial infarction, hemorrhage, hydrocephalus,extra-axial collection or mass lesion/mass effect. There is dilatation the ventricles and sulci consistent with age-related atrophy. Low-attenuation changes in the deep white matter consistent with small vessel ischemia. Vascular: No hyperdense vessel or unexpected calcification. Skull: The skull is intact. No fracture or focal lesion identified. Sinuses/Orbits: The visualized paranasal sinuses and mastoid air cells are clear. The orbits and globes intact. Other: None IMPRESSION: No acute intracranial abnormality. Findings consistent with age related atrophy and chronic small  vessel ischemia Electronically Signed   By: Prudencio Pair M.D.   On: 02/06/2020 00:54   DG Hip Unilat W or Wo Pelvis 2-3 Views Right  Result Date: 02/06/2020 CLINICAL DATA:  [place], right hip pain EXAM: DG HIP (WITH OR WITHOUT PELVIS) 2-3V RIGHT COMPARISON:  None. FINDINGS: Frontal view of the pelvis as well as frontal and frogleg lateral views of the right hip are obtained. There are no acute displaced fractures. The hips are well aligned, with mild symmetrical osteoarthritis noted. Sacroiliac joints are normal. Prominent spondylosis and facet hypertrophy at the lumbosacral junction. IMPRESSION: 1. Mild symmetrical hip osteoarthritis. No acute displaced fracture. 2. Prominent lower lumbar degenerative changes. Electronically Signed   By: Randa Ngo M.D.   On: 02/06/2020 00:44    Procedures Procedures (including critical care time)  Medications Ordered in ED Medications  ketorolac (TORADOL) 30 MG/ML injection 30 mg (30 mg Intravenous Given 02/06/20 0217)    ED Course  I have reviewed the triage vital signs and the nursing notes.  Pertinent labs & imaging results that were available during my care of the patient were reviewed by me and considered in my medical decision making (see chart for details).  307 case d/w Dr. Reatha Armour. He has reviewed the patient's imaging. RX for TLSO brace.  Voltaren is fine.  Follow up as an outpatient    Patient to be fitted in the ED for a TLSO brace  Cherye Gaertner [place] was evaluated in Emergency Department on 02/06/2020 for the symptoms described in the history of present illness. She was evaluated in the context of the  global COVID-19 pandemic, which necessitated consideration that the patient might be at risk for infection with the SARS-CoV-2 virus that causes COVID-19. Institutional protocols and algorithms that pertain to the evaluation of patients at risk for COVID-19 are in a state of rapid change based on information released by regulatory bodies including the  CDC and federal and state organizations. These policies and algorithms were followed during the patient's care in the ED.  Final Clinical Impression(s) / ED Diagnoses Return for intractable cough, coughing up blood,fevers >100.4 unrelieved by medication, shortness of breath, intractable vomiting, chest pain, shortness of breath, weakness,numbness, changes in speech, facial asymmetry,abdominal pain, passing out,Inability to tolerate liquids or food, cough, altered mental status or any concerns. No signs of systemic illness or infection. The patient is nontoxic-appearing on exam and vital signs are within normal limits.   I have reviewed the triage vital signs and the nursing notes. Pertinent labs &imaging results that were available during my care of the patient were reviewed by me and considered in my medical decision making (see chart for details).After history, exam, and medical workup I feel the patient has beenappropriately medically screened and is safe for discharge home. Pertinent diagnoses were discussed with the patient. Patient was given return precautions.   Lougenia Morrissey, MD 02/06/20 [place], Jishnu Jenniges, MD 02/06/20 [id number]

## 2020-02-06 NOTE — ED Notes (Signed)
Prior to arrival patient took 500mg  Robaxin, 5mg  Oxycodone, and 650mg  Tylenol at approximately 1730 with no relief.

## 2020-02-07 ENCOUNTER — Ambulatory Visit: Payer: Medicare Other | Admitting: Physical Therapy

## 2020-11-19 ENCOUNTER — Ambulatory Visit: Payer: Medicare Other | Admitting: Occupational Therapy

## 2021-04-09 IMAGING — RF UPPER GI SERIES W/HIGH DENSITY WITHOUT KUB
5 series · 14 of 21 positions shown · non-contrast
Comparison: Esophagram 12/04/2014

CLINICAL DATA: Epigastric pain. Gastroesophageal reflux disease.
Esophagitis.

EXAM:
UPPER GI SERIES WITH KUB
TECHNIQUE: After obtaining a scout radiograph a routine upper GI series was
performed using thin and high density barium.
FLUOROSCOPY TIME:  Fluoroscopy Time: 48 seconds of low-dose pulsed
fluoroscopy
Radiation Exposure Index (if provided by the fluoroscopic device):
112 mGy
Number of Acquired Spot Images: 0

[Series 1: one shot · 0.14mm/px · 1 of 1 slices shown (1 of 3)]
[im 1/1]
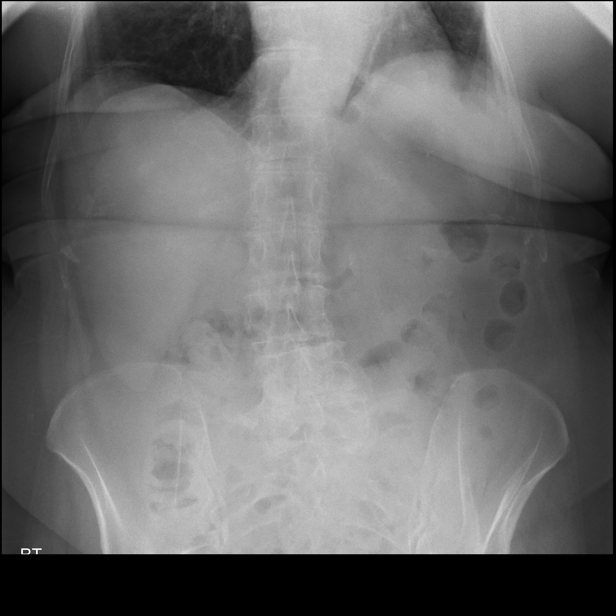

[Series 2: sequence · 2 of 89 frames shown (1 of 2)]
[frame 14/89]
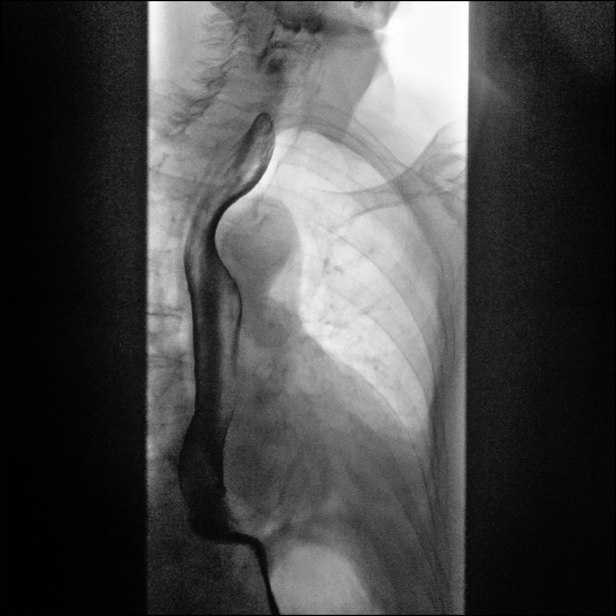
[frame 45/89]
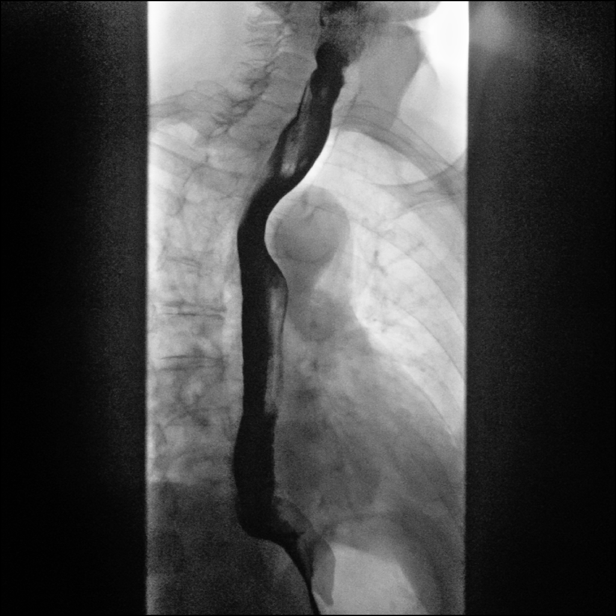

[Series 3: one shot · 5 of 7 slices shown (2 of 3)]
[im 1/7]
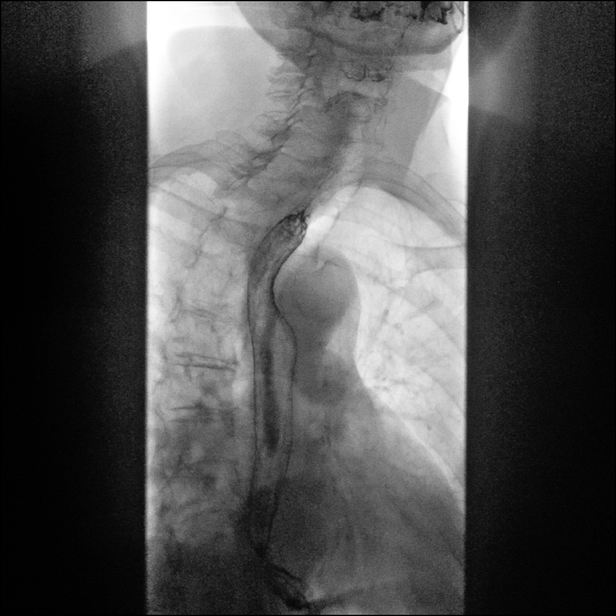
[im 2/7]
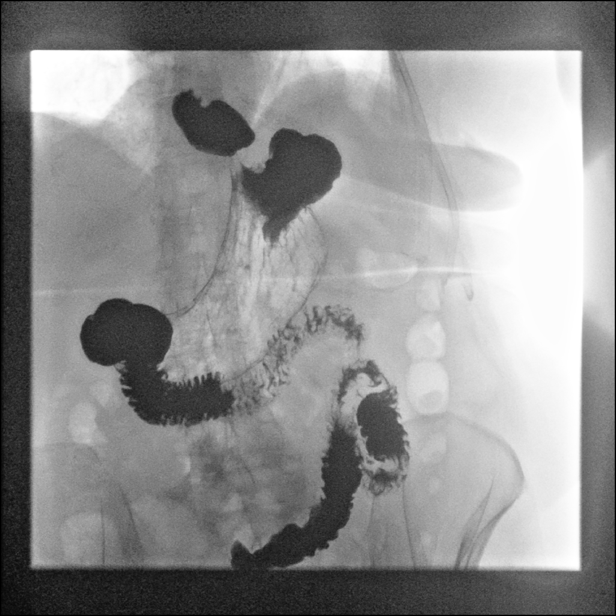
[im 4/7]
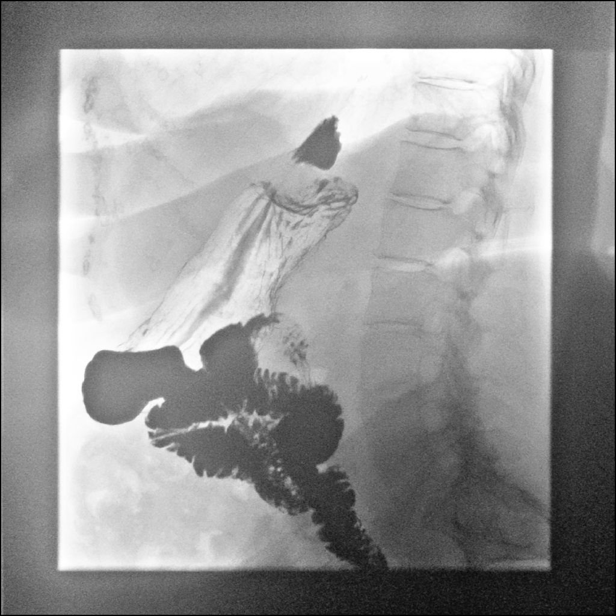
[im 5/7]
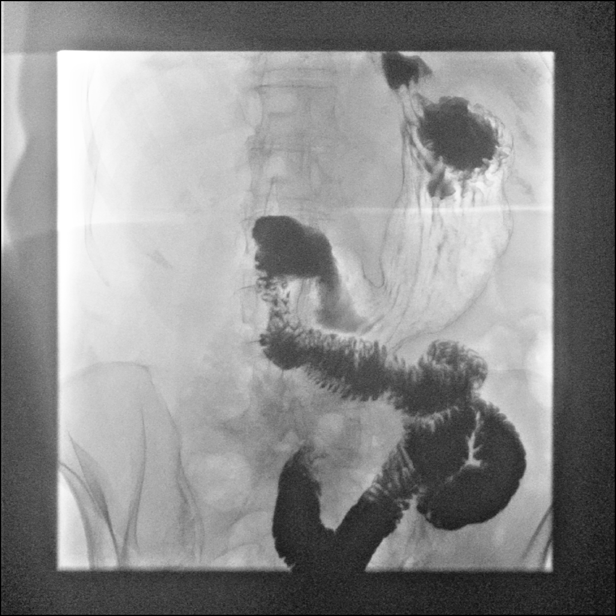
[im 7/7]
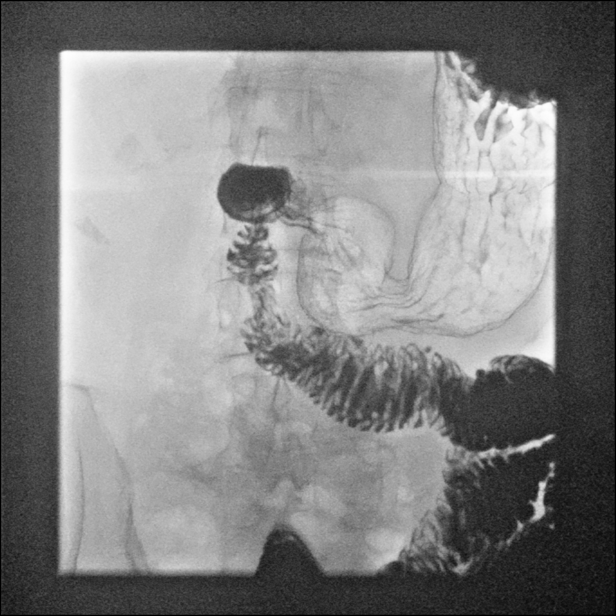

[Series 4: sequence · 3 of 69 frames shown (2 of 2)]
[frame 11/69]
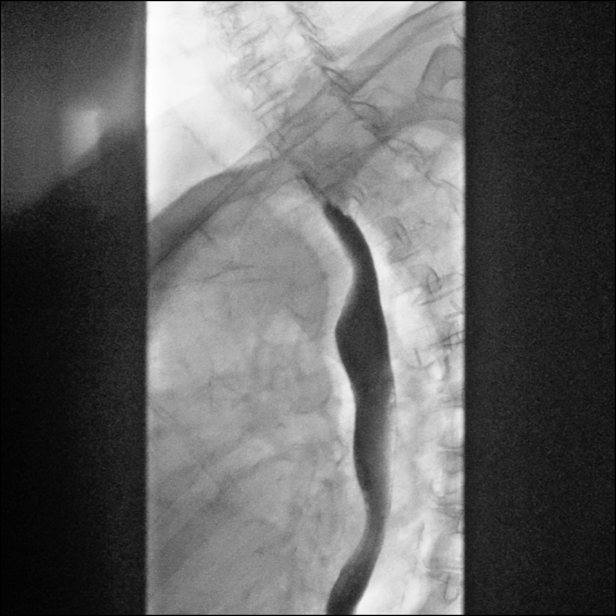
[frame 57/69]
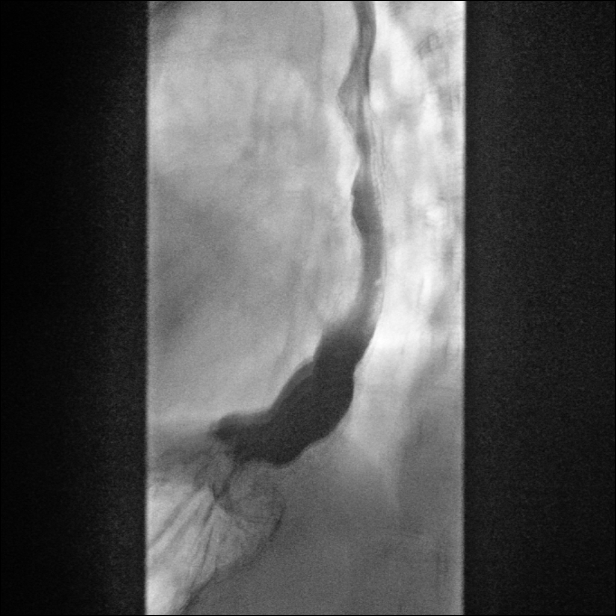
[frame 59/69]
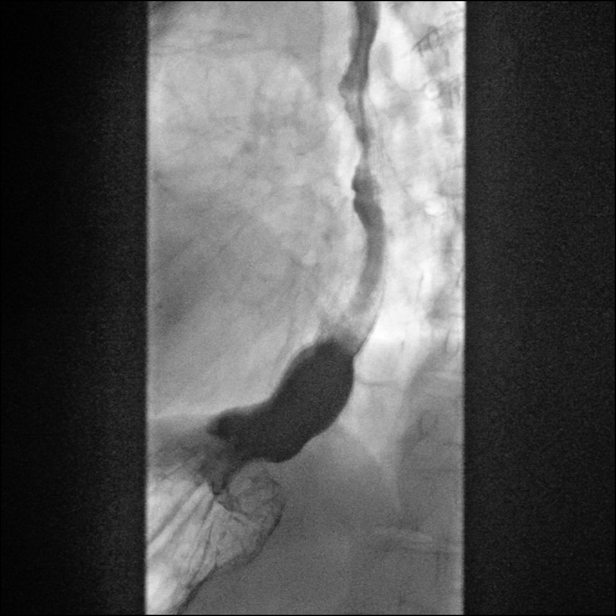

[Series 5: one shot · 3 of 5 slices shown (3 of 3)]
[im 2/5]
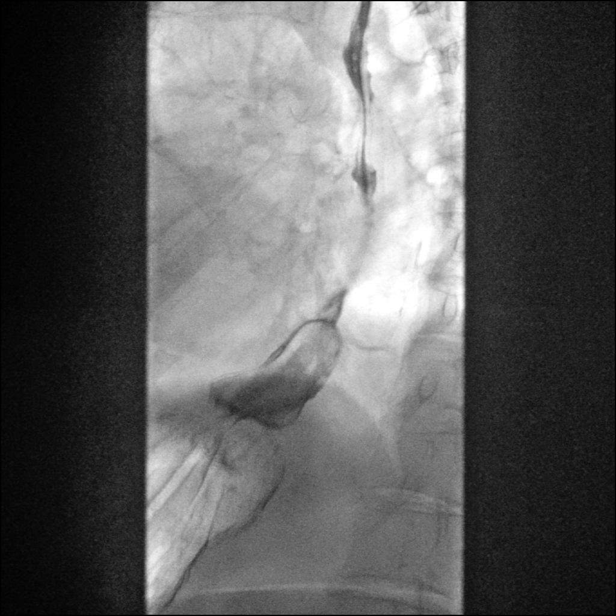
[im 3/5]
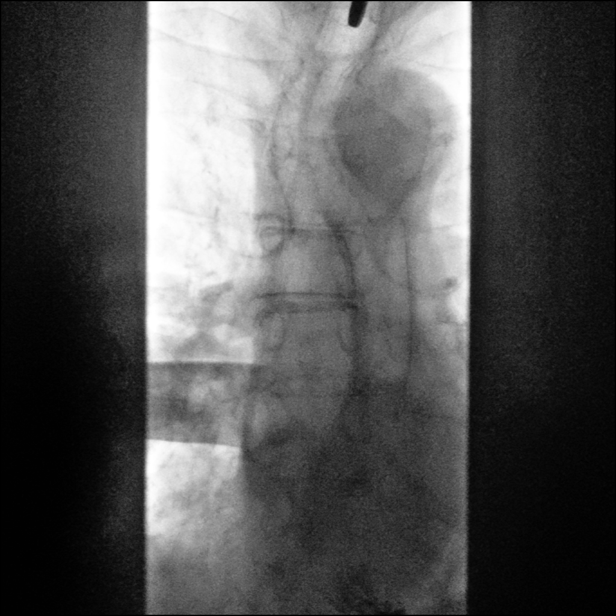
[im 5/5]
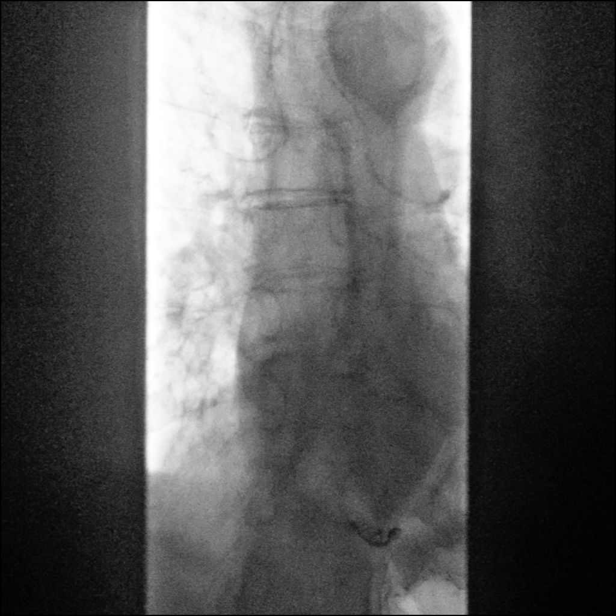

[14 of 21 positions shown; findings below may reference images not displayed]

FINDINGS: The scout abdominal radiograph demonstrates a normal bowel gas
pattern. There are degenerative changes throughout the spine
associated with a mild convex right scoliosis.

The patient swallowed the barium without difficulty. There is a
stable small hiatal hernia. There are mild tertiary contractions in
the mid to distal esophagus. No mucosal ulceration or mass is
identified. The stomach and duodenum appear normal without
ulceration. There is rapid gastric emptying.

Only trace gastroesophageal reflux was noted. A 13 mm barium tablet
was administered and passed without delay into the stomach.
IMPRESSION: 1. No significant change is seen from the previous study. No acute
findings.
2. Small hiatal hernia with mild tertiary contractions of the
esophagus and mild gastroesophageal reflux.
3. The stomach and duodenum appear normal.

## 2021-04-22 IMAGING — CR CHEST - 2 VIEW
2 series · 2 of 2 positions shown · non-contrast
Comparison: 02/02/2018

CLINICAL DATA: Epigastric pain, tachycardia, nausea

EXAM:
CHEST - 2 VIEW

[w chest pa]
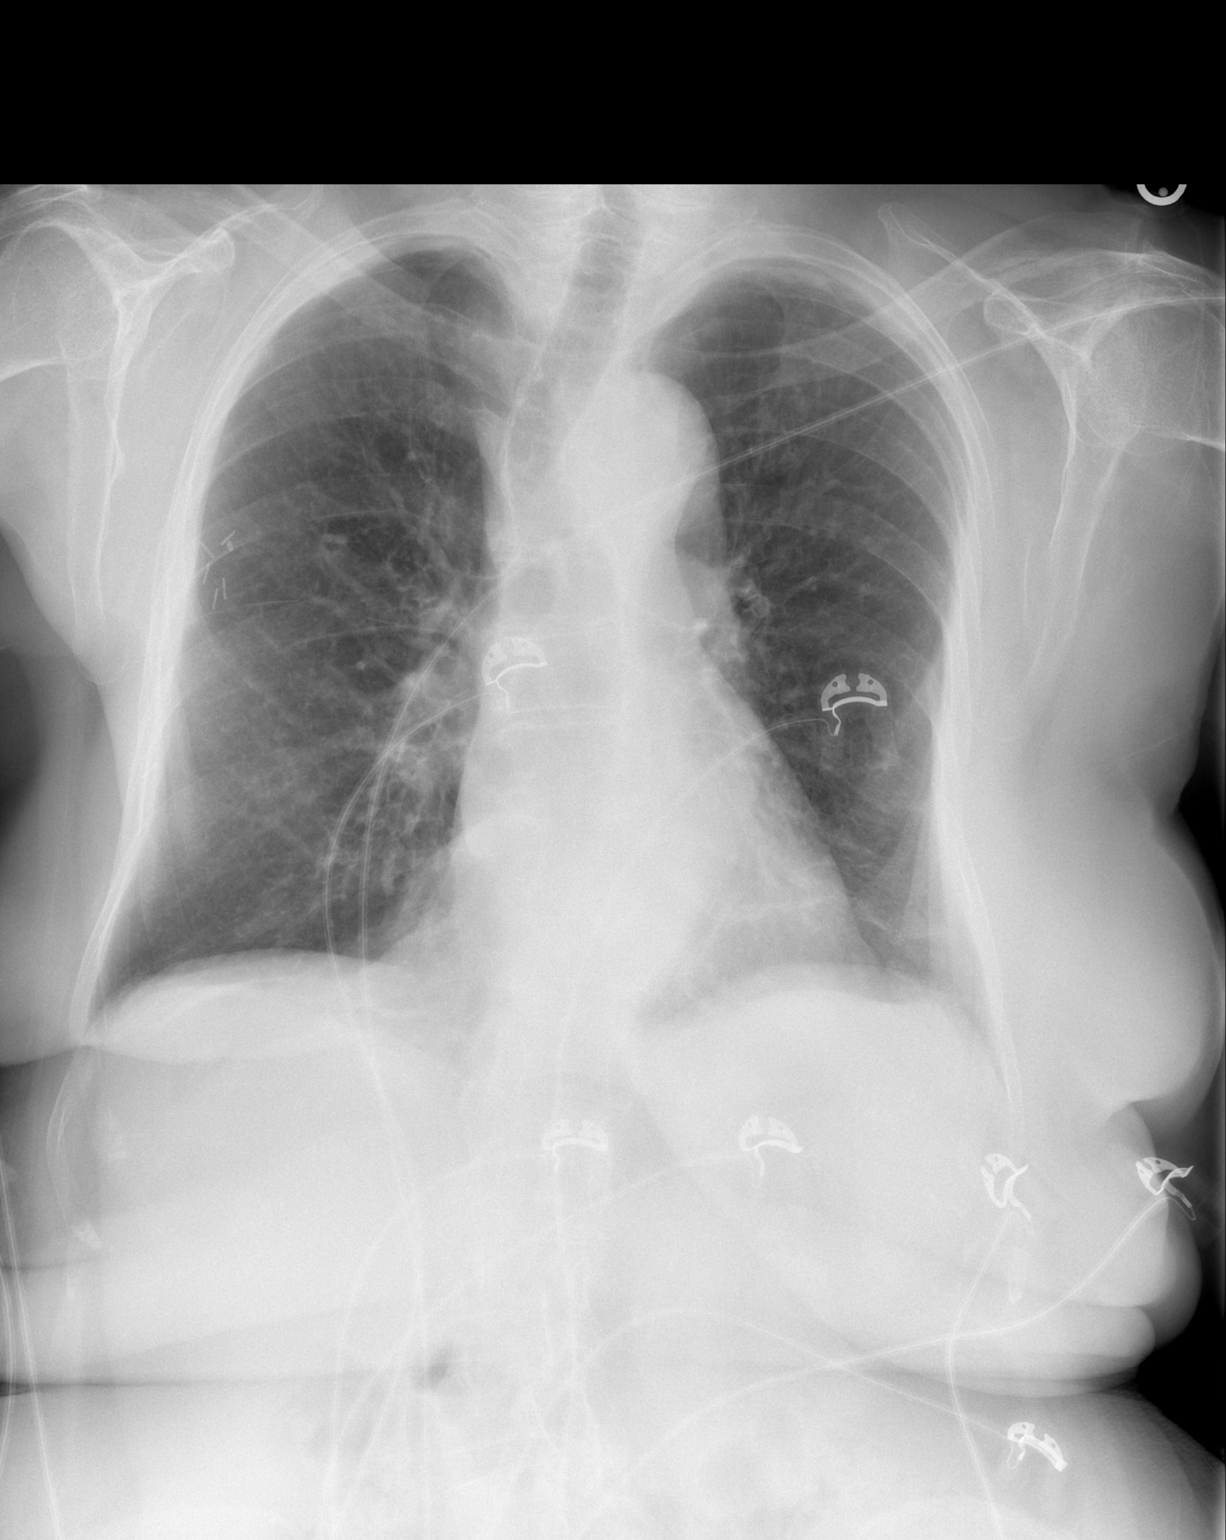

[w chest lat]
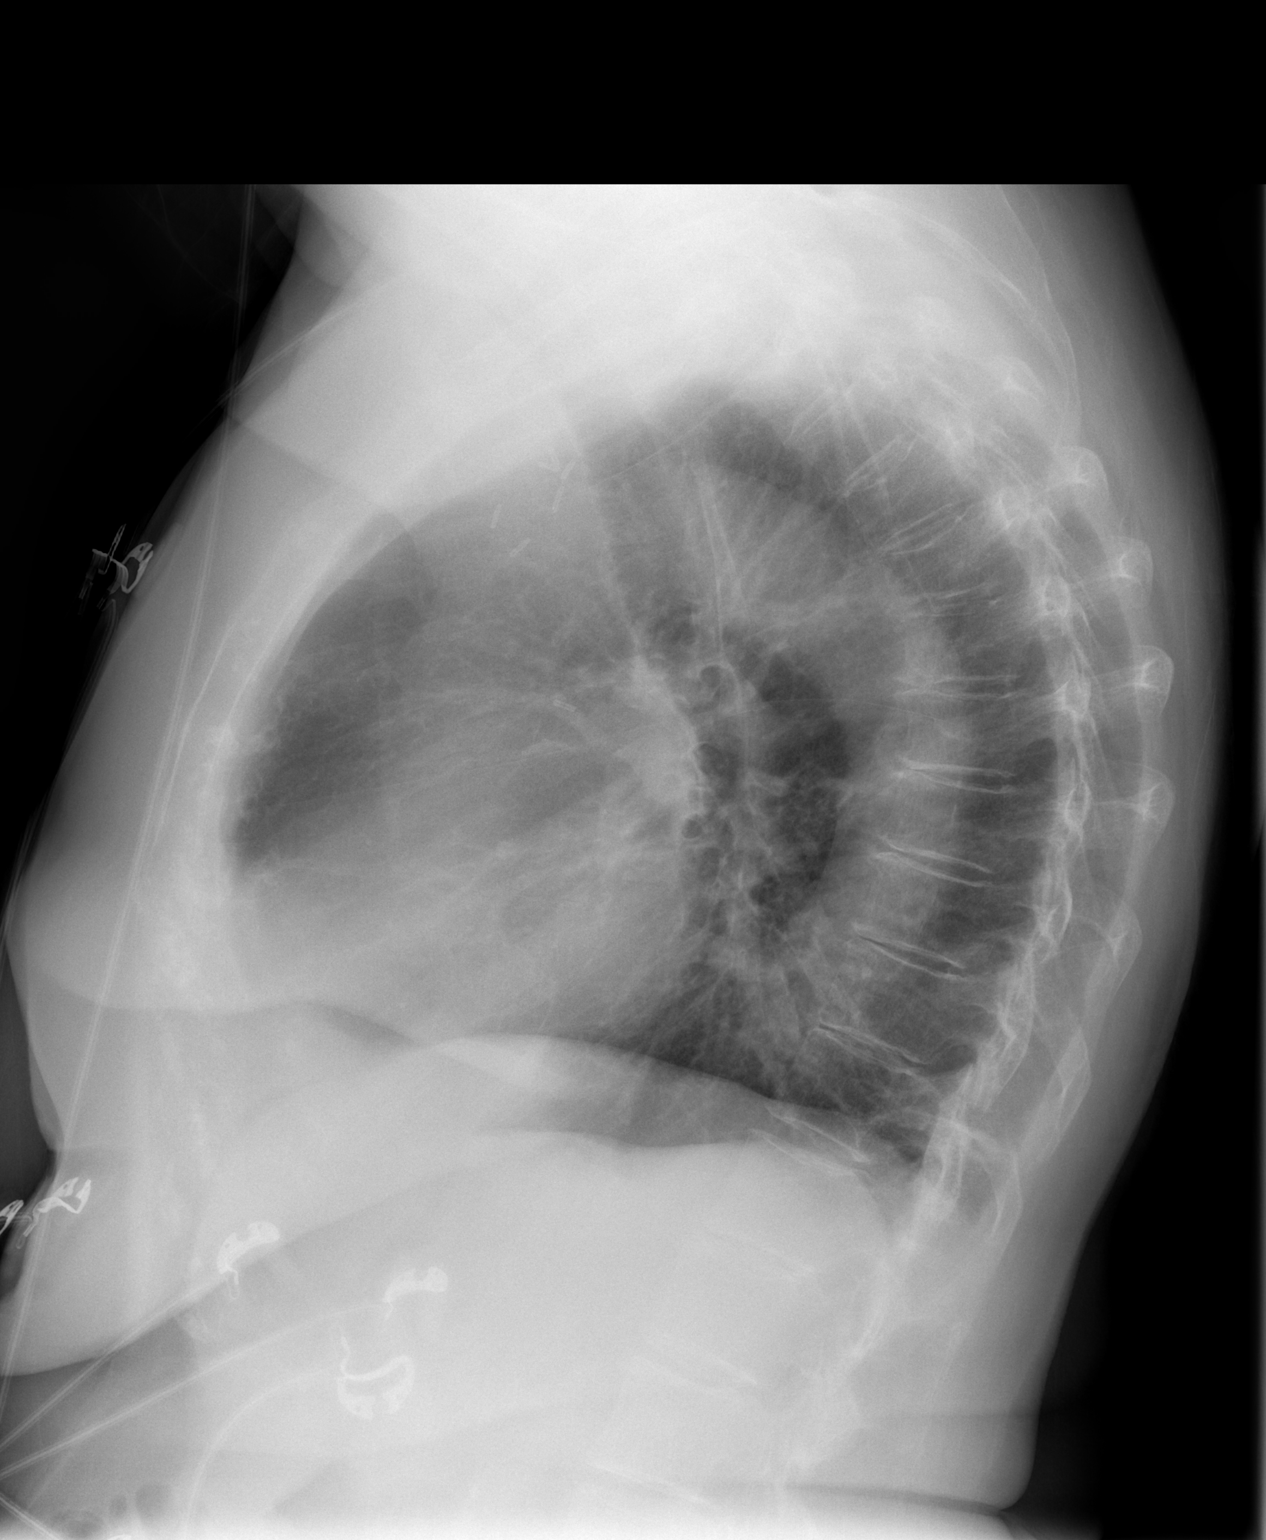

[2 of 2 positions shown; findings below may reference images not displayed]

FINDINGS: The heart size and mediastinal contours are within normal limits.
Both lungs are clear. Disc degenerative disease of the thoracic
spine.
IMPRESSION: No acute abnormality of the lungs.

## 2021-05-15 ENCOUNTER — Encounter (HOSPITAL_BASED_OUTPATIENT_CLINIC_OR_DEPARTMENT_OTHER): Payer: Self-pay | Admitting: *Deleted

## 2021-05-15 ENCOUNTER — Emergency Department (HOSPITAL_BASED_OUTPATIENT_CLINIC_OR_DEPARTMENT_OTHER): Payer: Medicare Other

## 2021-05-15 ENCOUNTER — Emergency Department (HOSPITAL_BASED_OUTPATIENT_CLINIC_OR_DEPARTMENT_OTHER)
Admission: EM | Admit: 2021-05-15 | Discharge: 2021-05-15 | Disposition: A | Discharge status: Home / Self Care | Payer: Medicare Other | Attending: Student | Admitting: Student

## 2021-05-15 ENCOUNTER — Other Ambulatory Visit: Payer: Self-pay

## 2021-05-15 DIAGNOSIS — K5730 Diverticulosis of large intestine without perforation or abscess without bleeding: Secondary | ICD-10-CM | POA: Insufficient documentation

## 2021-05-15 DIAGNOSIS — Z96651 Presence of right artificial knee joint: Secondary | ICD-10-CM | POA: Insufficient documentation

## 2021-05-15 DIAGNOSIS — S2222XA Fracture of body of sternum, initial encounter for closed fracture: Secondary | ICD-10-CM | POA: Insufficient documentation

## 2021-05-15 DIAGNOSIS — S2231XA Fracture of one rib, right side, initial encounter for closed fracture: Secondary | ICD-10-CM | POA: Diagnosis not present

## 2021-05-15 DIAGNOSIS — Z853 Personal history of malignant neoplasm of breast: Secondary | ICD-10-CM | POA: Insufficient documentation

## 2021-05-15 DIAGNOSIS — Z79899 Other long term (current) drug therapy: Secondary | ICD-10-CM | POA: Insufficient documentation

## 2021-05-15 DIAGNOSIS — Y92410 Unspecified street and highway as the place of occurrence of the external cause: Secondary | ICD-10-CM | POA: Diagnosis not present

## 2021-05-15 DIAGNOSIS — I10 Essential (primary) hypertension: Secondary | ICD-10-CM | POA: Diagnosis not present

## 2021-05-15 DIAGNOSIS — S299XXA Unspecified injury of thorax, initial encounter: Secondary | ICD-10-CM | POA: Diagnosis present

## 2021-05-15 LAB — BASIC METABOLIC PANEL
Anion gap: 11 (ref 5–15)
BUN: 11 mg/dL (ref 8–23)
CO2: 25 mmol/L (ref 22–32)
Calcium: 9 mg/dL (ref 8.9–10.3)
Chloride: 94 mmol/L — ABNORMAL LOW (ref 98–111)
Creatinine, Ser: 0.61 mg/dL (ref 0.44–1.00)
GFR, Estimated: >60 mL/min (ref >60)
Glucose, Bld: 110 mg/dL — ABNORMAL HIGH (ref 70–99)
Potassium: 3.7 mmol/L (ref 3.5–5.1)
Sodium: 130 mmol/L — ABNORMAL LOW (ref 135–145)

## 2021-05-15 LAB — CBC WITH DIFFERENTIAL/PLATELET
Abs Immature Granulocytes: 0.1 10*3/uL — ABNORMAL HIGH (ref 0.00–0.07)
Basophils Absolute: 0.1 10*3/uL (ref 0.0–0.1)
Basophils Relative: 0 %
Eosinophils Absolute: 0.1 10*3/uL (ref 0.0–0.5)
Eosinophils Relative: 0 %
HCT: 42.9 % (ref 36.0–46.0)
Hemoglobin: 14.8 g/dL (ref 12.0–15.0)
Immature Granulocytes: 1 %
Lymphocytes Relative: 29 %
Lymphs Abs: 5.9 10*3/uL — ABNORMAL HIGH (ref 0.7–4.0)
MCH: 31 pg (ref 26.0–34.0)
MCHC: 34.5 g/dL (ref 30.0–36.0)
MCV: 89.9 fL (ref 80.0–100.0)
Monocytes Absolute: 1.1 10*3/uL — ABNORMAL HIGH (ref 0.1–1.0)
Monocytes Relative: 6 %
Neutro Abs: 13.1 10*3/uL — ABNORMAL HIGH (ref 1.7–7.7)
Neutrophils Relative %: 64 %
Platelets: 200 10*3/uL (ref 150–400)
RBC: 4.77 MIL/uL (ref 3.87–5.11)
RDW: 13.2 % (ref 11.5–15.5)
Smear Review: NORMAL
WBC: 20.3 10*3/uL — ABNORMAL HIGH (ref 4.0–10.5)
nRBC: 0 % (ref 0.0–0.2)

## 2021-05-15 MED ORDER — IOHEXOL 300 MG/ML  SOLN
80.0000 mL | Freq: Once | INTRAMUSCULAR | Status: AC | PRN
  Administered 2021-05-15: 80 mL via INTRAVENOUS

## 2021-05-15 MED ORDER — IBUPROFEN 800 MG PO TABS
800.0000 mg | ORAL_TABLET | Freq: Once | ORAL | Status: AC
  Administered 2021-05-15: 800 mg via ORAL
  Filled 2021-05-15: qty 1

## 2021-05-15 MED ORDER — ACETAMINOPHEN 325 MG PO TABS
650.0000 mg | ORAL_TABLET | Freq: Once | ORAL | Status: AC
  Administered 2021-05-15: 650 mg via ORAL
  Filled 2021-05-15: qty 2

## 2021-05-15 MED ORDER — METHOCARBAMOL 500 MG PO TABS: 500.0000 mg | ORAL_TABLET | Freq: Two times a day (BID) | ORAL | 0 refills | Status: DC
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
## 2021-05-15 NOTE — Discharge Instructions (Addendum)
Please use Tylenol or ibuprofen for pain.  You may use 600 mg ibuprofen every 6 hours or 1000 mg of Tylenol every 6 hours.  You may choose to alternate between the 2.  This would be most effective.  Not to exceed 4 g of Tylenol within 24 hours.  Not to exceed 3200 mg ibuprofen 24 hours.  You can use Celebrex in place of ibuprofen as prescribed.  You can use the muscle relaxant up to twice daily, be careful as it may make you somewhat drowsy.  You can use your at home oxycodone up to every 6 hours in addition to the above for breakthrough pain.  If you use more than 1-2 doses of the narcotic pain medication I recommend that you also take some MiraLAX to help with bowel movements.  Please schedule follow-up with your primary care provider sometime next week for recheck, and in the meantime I recommend that you use the incentive spirometer that we gave you every 2 hours while you are awake.

## 2021-07-08 NOTE — Progress Notes (Signed)
Sent message, via epic in basket, requesting orders in epic from surgeon.  

## 2021-07-13 NOTE — H&P (Signed)
TOTAL KNEE ADMISSION H&P  Patient is being admitted for left total knee arthroplasty.  Subjective:  Chief Complaint:left knee pain.  HPI: [name], 88 y.o. female, has a history of pain and functional disability in the left knee due to arthritis and has failed non-surgical conservative treatments for greater than 12 weeks to includeNSAID's and/or analgesics, corticosteriod injections, viscosupplementation injections, and use of assistive devices.  Onset of symptoms was gradual, starting 1 years ago with gradually worsening course since that time. The patient noted prior procedures on the knee to include  arthroscopy and menisectomy on the left knee(s).  Patient currently rates pain in the left knee(s) at 6 out of 10 with activity. Patient has night pain, worsening of pain with activity and weight bearing, pain that interferes with activities of daily living, pain with passive range of motion, crepitus, and joint swelling.  Patient has evidence of periarticular osteophytes and joint space narrowing by imaging studies. This patient has had  no previous altering surgery . There is no active infection.  Patient Active Problem List   Diagnosis Date Noted   Osteoarthritis of right knee 01/04/2020   Past Medical History:  Diagnosis Date   Arthritis    Cancer ([place])    breast Ca. on 2013   GERD (gastroesophageal reflux disease)    Hypertension    Pneumonia    Polymyalgia rheumatica ([place])    Seizures ([place])     Past Surgical History:  Procedure Laterality Date   ABDOMINAL HYSTERECTOMY     BREAST SURGERY     TONSILLECTOMY     TOTAL KNEE ARTHROPLASTY Right 01/04/2020   Procedure: TOTAL KNEE ARTHROPLASTY;  Surgeon: Sydnee Cabal, MD;  Location: [facility];  Service: Orthopedics;  Laterality: Right;  adductor canal    No current facility-administered medications for this encounter.   Current Outpatient Medications  Medication Sig Dispense Refill Last Dose   acetaminophen (TYLENOL) 500 MG  tablet Take 2 tablets (1,000 mg total) by mouth every 6 (six) hours as needed. (Patient taking differently: Take 1,000 mg by mouth every 6 (six) hours as needed for moderate pain. ) 30 tablet 0    albuterol (VENTOLIN HFA) 108 (90 Base) MCG/ACT inhaler Inhale 1-2 puffs into the lungs every 6 (six) hours as needed for wheezing or shortness of breath.      amLODipine (NORVASC) 5 MG tablet Take 5 mg by mouth daily.       Diclofenac Sodium CR 100 MG 24 hr tablet Take 1 tablet (100 mg total) by mouth daily. 10 tablet 0    escitalopram (LEXAPRO) 10 MG tablet Take 10 mg by mouth daily.      ezetimibe (ZETIA) 10 MG tablet Take 10 mg by mouth daily.      famotidine (PEPCID) 40 MG tablet Take 40 mg by mouth at bedtime.       fluticasone (FLONASE) 50 MCG/ACT nasal spray Place 1 spray into the nose daily as needed for allergies.       gabapentin (NEURONTIN) 300 MG capsule Take 1 capsule (300 mg total) by mouth 3 (three) times daily. Take a 300 mg capsule three times a day for two weeks following surgery.Then take a 300 mg capsule two times a day for two weeks. Then take a 300 mg capsule once a day for two weeks. Then discontinue the Gabapentin. 84 capsule 0    hydrochlorothiazide (HYDRODIURIL) 12.5 MG tablet Take 12.5 mg by mouth daily.      lidocaine (LIDODERM) 5 % Place  1 patch onto the skin daily. Remove & Discard patch within 12 hours or as directed by MD 30 patch 0    methocarbamol (ROBAXIN) 500 MG tablet Take 1 tablet (500 mg total) by mouth 2 (two) times daily. 20 tablet 0    Multiple Vitamin (MULTIVITAMIN WITH MINERALS) TABS tablet Take 1 tablet by mouth daily.      oxyCODONE (OXY IR/ROXICODONE) 5 MG immediate release tablet Take 1-2 tablets (5-10 mg total) by mouth every 6 (six) hours as needed for moderate pain (pain score 4-6). 42 tablet 0    pantoprazole (PROTONIX) 40 MG tablet Take 40 mg by mouth daily.       PHENobarbital (LUMINAL) 97.2 MG tablet Take 97.2 mg by mouth at bedtime.       predniSONE  (DELTASONE) 5 MG tablet Take 5 mg by mouth daily with breakfast.       Allergies  Allergen Reactions   Sulfa Antibiotics Nausea Only   Penicillins Swelling    Tolerated Cephalosporin 01/04/20      Simvastatin Swelling    Social History   Tobacco Use   Smoking status: Never   Smokeless tobacco: Never  Substance Use Topics   Alcohol use: Never    No family history on file.   Review of Systems  All other systems reviewed and are negative.  Objective:  Physical Exam Constitutional:      Appearance: Normal appearance.  HENT:     Head: Normocephalic and atraumatic.  Neck:     Vascular: No carotid bruit.  Cardiovascular:     Rate and Rhythm: Normal rate and regular rhythm.     Pulses: Normal pulses.     Heart sounds: Normal heart sounds.  Pulmonary:     Effort: Pulmonary effort is normal.     Breath sounds: Normal breath sounds.  Musculoskeletal:        General: Swelling and tenderness (medial and lateral joint line) present.     Cervical back: Normal range of motion and neck supple.  Skin:    General: Skin is warm and dry.     Capillary Refill: Capillary refill takes less than 2 seconds.  Neurological:     General: No focal deficit present.     Mental Status: She is alert and oriented to person, place, and time.  Psychiatric:        Mood and Affect: Mood normal.        Behavior: Behavior normal.        Thought Content: Thought content normal.        Judgment: Judgment normal.    Vital signs in last 24 hours:    Labs:   Estimated body mass index is 27.25 kg/m as calculated from the following:   Height as of 05/15/21: 5\' 2"  (1.575 m).   Weight as of 05/15/21: 67.6 kg.   Imaging Review Plain radiographs demonstrate severe degenerative joint disease of the left knee(s). The overall alignment ismild valgus. The bone quality appears to be good for age and reported activity level.      Assessment/Plan:  End stage arthritis, left knee   The patient  history, physical examination, clinical judgment of the provider and imaging studies are consistent with end stage degenerative joint disease of the left knee(s) and total knee arthroplasty is deemed medically necessary. The treatment options including medical management, injection therapy arthroscopy and arthroplasty were discussed at length. The risks and benefits of total knee arthroplasty were presented and reviewed. The risks due to  aseptic loosening, infection, stiffness, patella tracking problems, thromboembolic complications and other imponderables were discussed. The patient acknowledged the explanation, agreed to proceed with the plan and consent was signed. Patient is being admitted for inpatient treatment for surgery, pain control, PT, OT, prophylactic antibiotics, VTE prophylaxis, progressive ambulation and ADL's and discharge planning. The patient is planning to be discharged home with home health services She wants home health with [facility]. She already has all the DME products she will need.      Patient's anticipated LOS is less than 2 midnights, meeting these requirements: - Younger than 30 - Lives within 1 hour of care - Has a competent adult at home to recover with post-op recover - NO history of  - Chronic pain requiring opiods  - Diabetes  - Coronary Artery Disease  - Heart failure  - Heart attack  - Stroke  - DVT/VTE  - Cardiac arrhythmia  - Respiratory Failure/COPD  - Renal failure  - Anemia  - Advanced Liver disease

## 2021-07-21 NOTE — Progress Notes (Addendum)
COVID swab appointment: 07/27/21 @ 1215  COVID Vaccine Completed: yes x2 Date COVID Vaccine completed: 08/17/19, 09/10/19 Has received booster: COVID vaccine manufacturer: Pfizer      Date of COVID positive in last 90 days: no  PCP - Barbee Shropshire, MD Cardiologist - [facility]   Chest x-ray - CT 05/15/21 Epic  EKG - 05/19/21 Epic Stress Test - 02/08/19 Epic ECHO - 02/08/19 Epic Cardiac Cath - n/a Pacemaker/ICD device last checked: n/a Spinal Cord Stimulator: n/a  Bowel Prep - no  Sleep Study - n/a CPAP -   Fasting Blood Sugar - n/a Checks Blood Sugar _____ times a day  Blood Thinner Instructions: n/a Aspirin Instructions:  Last Dose:  Activity level: Can perform activities of daily living without stopping and without symptoms of chest pain or shortness of breath. No stairs due to knee    Anesthesia review: HTN, seizures, DOE, anemia, palpitations  Patient denies shortness of breath, fever, cough and chest pain at PAT appointment   Patient verbalized understanding of instructions that were given to them at the PAT appointment. Patient was also instructed that they will need to review over the PAT instructions again at home before surgery.

## 2021-07-21 NOTE — Patient Instructions (Addendum)
DUE TO COVID-19 ONLY ONE VISITOR IS ALLOWED TO COME WITH YOU AND STAY IN THE WAITING ROOM ONLY DURING PRE OP AND PROCEDURE.   **NO VISITORS ARE ALLOWED IN THE SHORT STAY AREA OR RECOVERY ROOM!!**  IF YOU WILL BE ADMITTED INTO THE HOSPITAL YOU ARE ALLOWED ONLY TWO SUPPORT PEOPLE DURING VISITATION HOURS ONLY (7 AM -8PM)   The support person(s) must pass our screening, gel in and out, and wear a mask at all times, including in the patients room. Patients must also wear a mask when staff or their support person are in the room. Visitors GUEST BADGE MUST BE WORN VISIBLY  One adult visitor may remain with you overnight and MUST be in the room by 8 P.M.  No visitors under the age of 67. Any visitor under the age of 41 must be accompanied by an adult.    COVID SWAB TESTING MUST BE COMPLETED ON:  07/27/21 @ 12:15 PM   Site: [facility] [place] [place] Enter: Main Entrance have a seat in the waiting area to the right of main entrance (DO NOT [place]!!!!!) Dial: [phone] to alert staff you have arrived  You are not required to quarantine, however you are required to wear a well-fitted mask when you are out and around people not in your household.  Hand Hygiene often Do NOT share personal items Notify your provider if you are in close contact with someone who has COVID or you develop fever 100.4 or greater, new onset of sneezing, cough, sore throat, shortness of breath or body aches.       Your procedure is scheduled on: 07/29/21   Report to [facility] Main Entrance    Report to admitting at 5:15 AM   Call this number if you have problems the morning of surgery [phone]   Do not eat food :After Midnight.   May have liquids until 4:30 AM day of surgery  CLEAR LIQUID DIET  Foods Allowed                                                                     Foods Excluded  Water, Black Coffee and tea, regular and decaf                              liquids that you cannot  Plain Jell-O in any flavor  (No red)                                           see through such as: Fruit ices (not with fruit pulp)                                     milk, soups, orange juice              Iced Popsicles (No red)  All solid food                                   Apple juices Sports drinks like Gatorade (No red) Lightly seasoned clear broth or consume(fat free) Sugar    The day of surgery:  Drink ONE (1) Pre-Surgery Clear Ensure at 4:30 AM the morning of surgery. Drink in one sitting. Do not sip.  This drink was given to you during your hospital  pre-op appointment visit. Nothing else to drink after completing the  Pre-Surgery Clear Ensure.          If you have questions, please contact your surgeons office.  FOLLOW BOWEL PREP AND ANY ADDITIONAL PRE OP INSTRUCTIONS YOU RECEIVED FROM YOUR SURGEON'S OFFICE!!!     Oral Hygiene is also important to reduce your risk of infection.                                    Remember - BRUSH YOUR TEETH THE MORNING OF SURGERY WITH YOUR REGULAR TOOTHPASTE   Stop all vitamins and supplements 7 days before surgery   Take these medicines the morning of surgery with A SIP OF WATER: Tylenol, Amlodipine, Lexapro, Zetia, Pantoprazole                               You may not have any metal on your body including hair pins, jewelry, and body piercing             Do not wear make-up, lotions, powders, perfumes, or deodorant  Do not wear nail polish including gel and S&S, artificial/acrylic nails, or any other type of covering on natural nails including finger and toenails. If you have artificial nails, gel coating, etc. that needs to be removed by a nail salon please have this removed prior to surgery or surgery may need to be canceled/ delayed if the surgeon/ anesthesia feels like they are unable to be safely monitored.   Do not shave  48 hours prior to surgery.    Do not  bring valuables to the hospital. [place].   Bring small overnight bag day of surgery.              Please read over the following fact sheets you were given: IF YOU HAVE QUESTIONS ABOUT YOUR PRE-OP INSTRUCTIONS PLEASE CALL [facility] - Preparing for Surgery Before surgery, you can play an important role.  Because skin is not sterile, your skin needs to be as free of germs as possible.  You can reduce the number of germs on your skin by washing with CHG (chlorahexidine gluconate) soap before surgery.  CHG is an antiseptic cleaner which kills germs and bonds with the skin to continue killing germs even after washing. Please DO NOT use if you have an allergy to CHG or antibacterial soaps.  If your skin becomes reddened/irritated stop using the CHG and inform your nurse when you arrive at Short Stay. Do not shave (including legs and underarms) for at least 48 hours prior to the first CHG shower.  You may shave your face/neck.  Please follow these instructions carefully:  1.  Shower with CHG Soap the night before surgery and the  morning of surgery.  2.  If you choose to wash your hair, wash your hair first as usual with your normal  shampoo.  3.  After you shampoo, rinse your hair and body thoroughly to remove the shampoo.                             4.  Use CHG as you would any other liquid soap.  You can apply chg directly to the skin and wash.  Gently with a scrungie or clean washcloth.  5.  Apply the CHG Soap to your body ONLY FROM THE NECK DOWN.   Do   not use on face/ open                           Wound or open sores. Avoid contact with eyes, ears mouth and   genitals (private parts).                       Wash face,  Genitals (private parts) with your normal soap.             6.  Wash thoroughly, paying special attention to the area where your    surgery  will be performed.  7.  Thoroughly rinse your body with warm  water from the neck down.  8.  DO NOT shower/wash with your normal soap after using and rinsing off the CHG Soap.                9.  Pat yourself dry with a clean towel.            10.  Wear clean pajamas.            11.  Place clean sheets on your bed the night of your first shower and do not  sleep with pets. Day of Surgery : Do not apply any lotions/deodorants the morning of surgery.  Please wear clean clothes to the hospital/surgery center.  FAILURE TO FOLLOW THESE INSTRUCTIONS MAY RESULT IN THE CANCELLATION OF YOUR SURGERY  PATIENT SIGNATURE_________________________________  NURSE SIGNATURE__________________________________  ________________________________________________________________________   [name]  An incentive spirometer is a tool that can help keep your lungs clear and active. This tool measures how well you are filling your lungs with each breath. Taking long deep breaths may help reverse or decrease the chance of developing breathing (pulmonary) problems (especially infection) following: A long period of time when you are unable to move or be active. BEFORE THE PROCEDURE  If the spirometer includes an indicator to show your best effort, your nurse or respiratory therapist will set it to a desired goal. If possible, sit up straight or lean slightly forward. Try not to slouch. Hold the incentive spirometer in an upright position. INSTRUCTIONS FOR USE  Sit on the edge of your bed if possible, or sit up as far as you can in bed or on a chair. Hold the incentive spirometer in an upright position. Breathe out normally. Place the mouthpiece in your mouth and seal your lips tightly around it. Breathe in slowly and as deeply as possible, raising the piston or the ball toward the top of the column. Hold your breath for 3-5 seconds or for as long as possible. Allow the piston or ball to fall to the bottom of the column. Remove  the mouthpiece from your mouth and  breathe out normally. Rest for a few seconds and repeat Steps 1 through 7 at least 10 times every 1-2 hours when you are awake. Take your time and take a few normal breaths between deep breaths. The spirometer may include an indicator to show your best effort. Use the indicator as a goal to work toward during each repetition. After each set of 10 deep breaths, practice coughing to be sure your lungs are clear. If you have an incision (the cut made at the time of surgery), support your incision when coughing by placing a pillow or rolled up towels firmly against it. Once you are able to get out of bed, walk around indoors and cough well. You may stop using the incentive spirometer when instructed by your caregiver.  RISKS AND COMPLICATIONS Take your time so you do not get dizzy or light-headed. If you are in pain, you may need to take or ask for pain medication before doing incentive spirometry. It is harder to take a deep breath if you are having pain. AFTER USE Rest and breathe slowly and easily. It can be helpful to keep track of a log of your progress. Your caregiver can provide you with a simple table to help with this. If you are using the spirometer at home, follow these instructions: [place] IF:  You are having difficultly using the spirometer. You have trouble using the spirometer as often as instructed. Your pain medication is not giving enough relief while using the spirometer. You develop fever of 100.5 F (38.1 C) or higher. SEEK IMMEDIATE MEDICAL CARE IF:  You cough up bloody sputum that had not been present before. You develop fever of 102 F (38.9 C) or greater. You develop worsening pain at or near the incision site. MAKE SURE YOU:  Understand these instructions. Will watch your condition. Will get help right away if you are not doing well or get worse. Document Released: 10/04/2006 Document Revised: 08/16/2011 Document Reviewed: 12/05/2006 [employer] Patient  Information 2014 [employer], [place].   ________________________________________________________________________  WHAT IS A BLOOD TRANSFUSION? Blood Transfusion Information  A transfusion is the replacement of blood or some of its parts. Blood is made up of multiple cells which provide different functions. Red blood cells carry oxygen and are used for blood loss replacement. White blood cells fight against infection. Platelets control bleeding. Plasma helps clot blood. Other blood products are available for specialized needs, such as hemophilia or other clotting disorders. BEFORE THE TRANSFUSION  Who gives blood for transfusions?  Healthy volunteers who are fully evaluated to make sure their blood is safe. This is blood bank blood. Transfusion therapy is the safest it has ever been in the practice of medicine. Before blood is taken from a donor, a complete history is taken to make sure that person has no history of diseases nor engages in risky social behavior (examples are intravenous drug use or sexual activity with multiple partners). The donor's travel history is screened to minimize risk of transmitting infections, such as malaria. The donated blood is tested for signs of infectious diseases, such as HIV and hepatitis. The blood is then tested to be sure it is compatible with you in order to minimize the chance of a transfusion reaction. If you or a relative donates blood, this is often done in anticipation of surgery and is not appropriate for emergency situations. It takes many days to process the donated blood. RISKS AND COMPLICATIONS Although transfusion therapy  is very safe and saves many lives, the main dangers of transfusion include:  Getting an infectious disease. Developing a transfusion reaction. This is an allergic reaction to something in the blood you were given. Every precaution is taken to prevent this. The decision to have a blood transfusion has been considered carefully by your  caregiver before blood is given. Blood is not given unless the benefits outweigh the risks. AFTER THE TRANSFUSION Right after receiving a blood transfusion, you will usually feel much better and more energetic. This is especially true if your red blood cells have gotten low (anemic). The transfusion raises the level of the red blood cells which carry oxygen, and this usually causes an energy increase. The nurse administering the transfusion will monitor you carefully for complications. HOME CARE INSTRUCTIONS  No special instructions are needed after a transfusion. You may find your energy is better. Speak with your caregiver about any limitations on activity for underlying diseases you may have. SEEK MEDICAL CARE IF:  Your condition is not improving after your transfusion. You develop redness or irritation at the intravenous (IV) site. SEEK IMMEDIATE MEDICAL CARE IF:  Any of the following symptoms occur over the next 12 hours: Shaking chills. You have a temperature by mouth above 102 F (38.9 C), not controlled by medicine. Chest, back, or muscle pain. People around you feel you are not acting correctly or are confused. Shortness of breath or difficulty breathing. Dizziness and fainting. You get a rash or develop hives. You have a decrease in urine output. Your urine turns a dark color or changes to pink, red, or brown. Any of the following symptoms occur over the next 10 days: You have a temperature by mouth above 102 F (38.9 C), not controlled by medicine. Shortness of breath. Weakness after normal activity. The white part of the eye turns yellow (jaundice). You have a decrease in the amount of urine or are urinating less often. Your urine turns a dark color or changes to pink, red, or brown. Document Released: 05/21/2000 Document Revised: 08/16/2011 Document Reviewed: 01/08/2008 [facility] Patient Information 2014 [place],  [place].  _______________________________________________________________________

## 2021-07-22 ENCOUNTER — Encounter (HOSPITAL_COMMUNITY)
Admission: RE | Admit: 2021-07-22 | Discharge: 2021-07-22 | Disposition: A | Discharge status: Home / Self Care | Payer: Medicare Other | Source: Ambulatory Visit | Attending: Specialist | Admitting: Specialist

## 2021-07-22 ENCOUNTER — Encounter (HOSPITAL_COMMUNITY): Payer: Self-pay

## 2021-07-22 ENCOUNTER — Other Ambulatory Visit: Payer: Self-pay

## 2021-07-22 VITALS — BP 149/80 | HR 74 | Temp 97.8°F | Resp 16 | Ht 62.5 in | Wt 150.0 lb

## 2021-07-22 DIAGNOSIS — Z01812 Encounter for preprocedural laboratory examination: Secondary | ICD-10-CM | POA: Diagnosis not present

## 2021-07-22 DIAGNOSIS — M1712 Unilateral primary osteoarthritis, left knee: Secondary | ICD-10-CM | POA: Diagnosis not present

## 2021-07-22 DIAGNOSIS — R569 Unspecified convulsions: Secondary | ICD-10-CM | POA: Insufficient documentation

## 2021-07-22 DIAGNOSIS — Z20822 Contact with and (suspected) exposure to covid-19: Secondary | ICD-10-CM | POA: Insufficient documentation

## 2021-07-22 DIAGNOSIS — M1711 Unilateral primary osteoarthritis, right knee: Secondary | ICD-10-CM

## 2021-07-22 DIAGNOSIS — I10 Essential (primary) hypertension: Secondary | ICD-10-CM | POA: Insufficient documentation

## 2021-07-22 DIAGNOSIS — Z01818 Encounter for other preprocedural examination: Secondary | ICD-10-CM

## 2021-07-22 HISTORY — DX: Personal history of urinary calculi: Z87.442

## 2021-07-22 LAB — COMPREHENSIVE METABOLIC PANEL
ALT: 18 U/L (ref 0–44)
AST: 28 U/L (ref 15–41)
Albumin: 4.6 g/dL (ref 3.5–5.0)
Alkaline Phosphatase: 96 U/L (ref 38–126)
Anion gap: 10 (ref 5–15)
BUN: 12 mg/dL (ref 8–23)
CO2: 27 mmol/L (ref 22–32)
Calcium: 9.7 mg/dL (ref 8.9–10.3)
Chloride: 97 mmol/L — ABNORMAL LOW (ref 98–111)
Creatinine, Ser: 0.77 mg/dL (ref 0.44–1.00)
GFR, Estimated: >60 mL/min (ref >60)
Glucose, Bld: 115 mg/dL — ABNORMAL HIGH (ref 70–99)
Potassium: 5.7 mmol/L — ABNORMAL HIGH (ref 3.5–5.1)
Sodium: 134 mmol/L — ABNORMAL LOW (ref 135–145)
Total Bilirubin: 0.7 mg/dL (ref 0.3–1.2)
Total Protein: 7.2 g/dL (ref 6.5–8.1)

## 2021-07-22 LAB — CBC
HCT: 45 % (ref 36.0–46.0)
Hemoglobin: 14.7 g/dL (ref 12.0–15.0)
MCH: 31.1 pg (ref 26.0–34.0)
MCHC: 32.7 g/dL (ref 30.0–36.0)
MCV: 95.3 fL (ref 80.0–100.0)
Platelets: 233 10*3/uL (ref 150–400)
RBC: 4.72 MIL/uL (ref 3.87–5.11)
RDW: 13.3 % (ref 11.5–15.5)
WBC: 13.2 10*3/uL — ABNORMAL HIGH (ref 4.0–10.5)
nRBC: 0 % (ref 0.0–0.2)

## 2021-07-22 LAB — SURGICAL PCR SCREEN
MRSA, PCR: NEGATIVE
Staphylococcus aureus: NEGATIVE

## 2021-07-23 ENCOUNTER — Encounter (HOSPITAL_COMMUNITY): Payer: Self-pay | Admitting: Certified Registered"

## 2021-07-23 ENCOUNTER — Encounter (HOSPITAL_COMMUNITY): Payer: Self-pay | Admitting: Emergency Medicine

## 2021-07-23 NOTE — Progress Notes (Signed)
Anesthesia Chart Review:   Case: [id number] Date/Time: 07/29/21 0715   Procedure: TOTAL KNEE ARTHROPLASTY (Left: Knee) - adductor canal block 120   Anesthesia type: Spinal   Pre-op diagnosis: Left knee osteoarthritis   Location: [facility] ROOM 08 / [facility]   Surgeons: Sydnee Cabal, MD       DISCUSSION: Pt is 88 years old with hx HTN, carotid artery bruit, seizures, monoclonal B-cell lymphocytosis (per hem-onc notes, of unknown signficance)  Was in [place] 05/15/21 with nondisplaced fx of sternum and R 9th rib fx.    VS: BP (!) 149/80    Pulse 74    Temp 36.6 C (Oral)    Resp 16    Ht 5' 2.5" (1.588 m)    Wt 68 kg    LMP  (LMP Unknown)    SpO2 98%    BMI 27.00 kg/m   PROVIDERS: - PCP is Velazquez, Fransico Meadow., MD (notes in care everywhere)  - Oncologist is Jacqulyn Ducking, MD (notes in care everywhere)  - Cardiologist is Claudia Desanctis, MD. Last office visit 11/13/20 (notes in care everywhere)    LABS: Labs reviewed: Acceptable for surgery. (all labs ordered are listed, but only abnormal results are displayed)  Labs Reviewed  CBC - Abnormal; Notable for the following components:      Result Value   WBC 13.2 (*)    All other components within normal limits  COMPREHENSIVE METABOLIC PANEL - Abnormal; Notable for the following components:   Sodium 134 (*)    Potassium 5.7 (*)    Chloride 97 (*)    Glucose, Bld 115 (*)    All other components within normal limits  SURGICAL PCR SCREEN  TYPE AND SCREEN     IMAGES: CT chest 05/15/21:  1. Acute sternal fracture with acute nondisplaced posterior right ninth rib fracture. 2. No pneumothorax or pleural effusion. 3. Tiny bilateral pulmonary nodules are unchanged in the interval. 4. Tiny hiatal hernia. 5. Aortic Atherosclerosis   EKG 05/15/21: Sinus rhythm with sinus arrhythmia with occasional PVCs. Low voltage QRS   CV: Carotid duplex 11/18/20 ([facility]):  21-39% diameter reduction of the BICA.  All Doppler velocities within normal limits.  No  evidence of hemodynamically significant BICA stenosis.  Vertebral artery flow is antegrade.   Nuclear stress test 02/08/19 (care everywhere):  - There is normal isotope uptake following Lexiscan injection and at rest.  - There is no evidence of ischemia.  - Normal LV function with a calculated EF of 83%.  Echo 02/08/19 (care everywhere): - The study was technically difficult, but adequate.  - Mild mitral annular calcification.  - The mitral valve leaflets appear pliable and mobile.  - No evidence of mitral stenosis.  - Mild mitral regurgitation.  - There is mild aortic valve sclerosis noted, with no evidence of stenosis.  - Normal left ventricular size and systolic function with no appreciable segmental abnormality.  - Ejection fraction is visually estimated at 60-65%.  - Diastolic function is abnormal.     Past Medical History:  Diagnosis Date   Arthritis    Cancer ([place])    breast Ca. on 2013   GERD (gastroesophageal reflux disease)    History of kidney stones    Hypertension    Pneumonia    Polymyalgia rheumatica ([facility])    Seizures ([place])     Past Surgical History:  Procedure Laterality Date   ABDOMINAL HYSTERECTOMY     BREAST SURGERY     CARPAL TUNNEL RELEASE Left  TONSILLECTOMY     TOTAL KNEE ARTHROPLASTY Right 01/04/2020   Procedure: TOTAL KNEE ARTHROPLASTY;  Surgeon: Sydnee Cabal, MD;  Location: [facility];  Service: Orthopedics;  Laterality: Right;  adductor canal    MEDICATIONS:  acetaminophen (TYLENOL) 500 MG tablet   amLODipine (NORVASC) 5 MG tablet   celecoxib (CELEBREX) 200 MG capsule   escitalopram (LEXAPRO) 10 MG tablet   ezetimibe (ZETIA) 10 MG tablet   fluticasone (FLONASE) 50 MCG/ACT nasal spray   hydrochlorothiazide (HYDRODIURIL) 12.5 MG tablet   methocarbamol (ROBAXIN) 500 MG tablet   pantoprazole (PROTONIX) 40 MG tablet   PHENobarbital (LUMINAL) 97.2 MG tablet   predniSONE (DELTASONE) 5 MG tablet   No current facility-administered medications  for this encounter.    If no changes, I anticipate pt can proceed with surgery as scheduled.   Willeen Cass, PhD, FNP-BC [facility]/Anesthesiology Phone: [phone] 07/23/2021 1:32 PM

## 2021-07-23 NOTE — Anesthesia Preprocedure Evaluation (Deleted)
Anesthesia Evaluation    Airway        Dental   Pulmonary neg pulmonary ROS,           Cardiovascular hypertension, Pt. on medications      Neuro/Psych Seizures -, Well Controlled,  negative psych ROS   GI/Hepatic Neg liver ROS, GERD  Medicated,  Endo/Other  negative endocrine ROS  Renal/GU negative Renal ROS  negative genitourinary   Musculoskeletal  (+) Arthritis , Osteoarthritis,  PMR   Abdominal   Peds  Hematology negative hematology ROS (+)   Anesthesia Other Findings   Reproductive/Obstetrics                            Anesthesia Physical Anesthesia Plan  ASA: 3  Anesthesia Plan: MAC, Spinal and Regional   Post-op Pain Management: Regional block* and Tylenol PO (pre-op)*   Induction: Intravenous  PONV Risk Score and Plan: 2 and Ondansetron, Dexamethasone, Propofol infusion and Treatment may vary due to age or medical condition  Airway Management Planned: Simple Face Mask, Natural Airway and Nasal Cannula  Additional Equipment: None  Intra-op Plan:   Post-operative Plan:   Informed Consent:   Plan Discussed with:   Anesthesia Plan Comments: (See APP note by Durel Salts, FNP   Lab Results      Component                Value               Date                      WBC                      13.2 (H)            07/22/2021                HGB                      14.7                07/22/2021                HCT                      45.0                07/22/2021                MCV                      95.3                07/22/2021                PLT                      233                 07/22/2021           Lab Results      Component                Value               Date  NA                       134 (L)             07/22/2021                K                        5.7 (H)             07/22/2021                CO2                      27                   07/22/2021                GLUCOSE                  115 (H)             07/22/2021                BUN                      12                  07/22/2021                CREATININE               0.77                07/22/2021                CALCIUM                  9.7                 07/22/2021                GFRNONAA                 >60                 07/22/2021          )       Anesthesia Quick Evaluation

## 2021-07-27 ENCOUNTER — Encounter (HOSPITAL_COMMUNITY)
Admission: RE | Admit: 2021-07-27 | Discharge: 2021-07-27 | Disposition: A | Discharge status: Home / Self Care | Payer: Medicare Other | Source: Ambulatory Visit | Attending: Specialist | Admitting: Specialist

## 2021-07-27 ENCOUNTER — Other Ambulatory Visit: Payer: Self-pay

## 2021-07-27 DIAGNOSIS — Z20822 Contact with and (suspected) exposure to covid-19: Secondary | ICD-10-CM | POA: Diagnosis not present

## 2021-07-27 DIAGNOSIS — Z01812 Encounter for preprocedural laboratory examination: Secondary | ICD-10-CM | POA: Diagnosis not present

## 2021-07-28 LAB — SARS CORONAVIRUS 2 (TAT 6-24 HRS): SARS Coronavirus 2: NEGATIVE

## 2021-07-29 ENCOUNTER — Ambulatory Visit (HOSPITAL_COMMUNITY): Admission: RE | Admit: 2021-07-29 | Payer: Medicare Other | Source: Home / Self Care | Admitting: Specialist

## 2021-07-29 ENCOUNTER — Encounter (HOSPITAL_COMMUNITY): Admission: RE | Payer: Self-pay | Source: Home / Self Care

## 2021-07-29 DIAGNOSIS — M1711 Unilateral primary osteoarthritis, right knee: Secondary | ICD-10-CM

## 2021-07-29 LAB — TYPE AND SCREEN
ABO/RH(D): A POS
Antibody Screen: NEGATIVE

## 2021-07-29 SURGERY — ARTHROPLASTY, KNEE, TOTAL
Anesthesia: Spinal | Site: Knee | Laterality: Left

## 2021-12-24 ENCOUNTER — Encounter (HOSPITAL_BASED_OUTPATIENT_CLINIC_OR_DEPARTMENT_OTHER): Payer: Self-pay | Admitting: Emergency Medicine

## 2021-12-24 ENCOUNTER — Other Ambulatory Visit: Payer: Self-pay

## 2021-12-24 ENCOUNTER — Emergency Department (HOSPITAL_BASED_OUTPATIENT_CLINIC_OR_DEPARTMENT_OTHER)
Admission: EM | Admit: 2021-12-24 | Discharge: 2021-12-24 | Disposition: A | Discharge status: Home / Self Care | Payer: Medicare Other | Attending: Emergency Medicine | Admitting: Emergency Medicine

## 2021-12-24 DIAGNOSIS — E875 Hyperkalemia: Secondary | ICD-10-CM | POA: Diagnosis not present

## 2021-12-24 DIAGNOSIS — R799 Abnormal finding of blood chemistry, unspecified: Secondary | ICD-10-CM | POA: Diagnosis present

## 2021-12-24 DIAGNOSIS — R195 Other fecal abnormalities: Secondary | ICD-10-CM | POA: Diagnosis not present

## 2021-12-24 DIAGNOSIS — R1031 Right lower quadrant pain: Secondary | ICD-10-CM | POA: Diagnosis not present

## 2021-12-24 LAB — BASIC METABOLIC PANEL
Anion gap: 8 (ref 5–15)
BUN: 13 mg/dL (ref 8–23)
CO2: 27 mmol/L (ref 22–32)
Calcium: 9.3 mg/dL (ref 8.9–10.3)
Chloride: 100 mmol/L (ref 98–111)
Creatinine, Ser: 0.67 mg/dL (ref 0.44–1.00)
GFR, Estimated: >60 mL/min (ref >60)
Glucose, Bld: 112 mg/dL — ABNORMAL HIGH (ref 70–99)
Potassium: 3.9 mmol/L (ref 3.5–5.1)
Sodium: 135 mmol/L (ref 135–145)

## 2021-12-24 NOTE — ED Provider Notes (Signed)
[facility] EMERGENCY DEPARTMENT Provider Note   CSN: [id number] Arrival date & time: 12/24/21  1640     History  Chief Complaint  Patient presents with   Abnormal Lab    [name] is a 88 y.o. female.  Patient here as primary care doctor told her she had a potassium of 5.5.  She was seen because she has been having some mucousy stool and some right lower abdominal pain at times.  She had a CT scan today that she says is normal.  She denies any black or bloody stools.  Denies any ongoing abdominal pain.  She is here due to potassium.  The history is provided by the patient.       Home Medications Prior to Admission medications   Medication Sig Start Date End Date Taking? Authorizing Provider  acetaminophen (TYLENOL) 500 MG tablet Take 2 tablets (1,000 mg total) by mouth every 6 (six) hours as needed. 07/09/18   Charlesetta Shanks, MD  amLODipine (NORVASC) 5 MG tablet Take 5 mg by mouth daily.  07/03/16   [provider]  celecoxib (CELEBREX) 200 MG capsule Take 200 mg by mouth daily. 07/12/21   [provider]  escitalopram (LEXAPRO) 10 MG tablet Take 10 mg by mouth daily.    [provider]  ezetimibe (ZETIA) 10 MG tablet Take 10 mg by mouth daily.    [provider]  fluticasone (FLONASE) 50 MCG/ACT nasal spray Place 1 spray into the nose daily as needed for allergies.  02/03/18   [provider]  hydrochlorothiazide (HYDRODIURIL) 12.5 MG tablet Take 12.5 mg by mouth daily.    [provider]  methocarbamol (ROBAXIN) 500 MG tablet Take 1 tablet (500 mg total) by mouth 2 (two) times daily. Patient not taking: Reported on 07/16/2021 05/15/21   Prosperi, Christian H, PA-C  pantoprazole (PROTONIX) 40 MG tablet Take 40 mg by mouth daily.  01/14/14   [provider]  PHENobarbital (LUMINAL) 97.2 MG tablet Take 97.2 mg by mouth at bedtime.  05/17/16   [provider]  predniSONE (DELTASONE) 5 MG tablet Take  5 mg by mouth daily with breakfast.  02/22/18   [provider]      Allergies    Sulfa antibiotics, Penicillins, and Simvastatin    Review of Systems   Review of Systems  Physical Exam Updated Vital Signs BP (!) 178/62 (BP Location: Left Arm)   Pulse (!) 52   Temp 98.4 F (36.9 C) (Oral)   Resp 18   Ht 5' 2.5" (1.588 m)   Wt 67 kg   LMP  (LMP Unknown)   SpO2 96%   BMI 26.60 kg/m  Physical Exam Vitals and nursing note reviewed.  Constitutional:      General: She is not in acute distress.    Appearance: She is well-developed.  HENT:     Head: Normocephalic and atraumatic.  Eyes:     Conjunctiva/sclera: Conjunctivae normal.  Cardiovascular:     Rate and Rhythm: Normal rate and regular rhythm.     Heart sounds: No murmur heard. Pulmonary:     Effort: Pulmonary effort is normal. No respiratory distress.     Breath sounds: Normal breath sounds.  Abdominal:     Palpations: Abdomen is soft.     Tenderness: There is no abdominal tenderness.  Musculoskeletal:        General: No swelling.     Cervical back: Neck supple.  Skin:    General: Skin  is warm and dry.     Capillary Refill: Capillary refill takes less than 2 seconds.  Neurological:     Mental Status: She is alert.  Psychiatric:        Mood and Affect: Mood normal.     ED Results / Procedures / Treatments   Labs (all labs ordered are listed, but only abnormal results are displayed) Labs Reviewed  BASIC METABOLIC PANEL - Abnormal; Notable for the following components:      Result Value   Glucose, Bld 112 (*)    All other components within normal limits    EKG EKG Interpretation  Date/Time:  Thursday December 24 2021 17:01:54 EDT Ventricular Rate:  57 PR Interval:  162 QRS Duration: 70 QT Interval:  406 QTC Calculation: 395 R Axis:   66 Text Interpretation: Sinus bradycardia When compared with ECG of 15-May-2021 11:59, PREVIOUS ECG IS PRESENT Confirmed by Lennice Sites ([id number]) on 12/24/2021  5:11:41 PM  Radiology No results found.  Procedures Procedures    Medications Ordered in ED Medications - No data to display  ED Course/ Medical Decision Making/ A&P                           Medical Decision Making Amount and/or Complexity of Data Reviewed Labs: ordered.   [name] is here for recheck of her potassium.  Normal vitals.  No fever.  Potassium is 3.9.  She had a potassium of 5.5 done at outpatient office today.  She had a CT scan of abdomen and pelvis today that she states was normal.  She has been having some issues with her bowel movements.  Sometimes she has mucousy stools that are hard and sometimes she has loose stools.  She denies any black or bloody stools.  Her hemoglobin today was normal.  Overall she is not having any abdominal tenderness on exam.  We will refer her to GI.  Discharged in good condition.  This chart was dictated using voice recognition software.  Despite best efforts to proofread,  errors can occur which can change the documentation meaning.         Final Clinical Impression(s) / ED Diagnoses Final diagnoses:  Hyperkalemia    Rx / DC Orders ED Discharge Orders     None         Lennice Sites, DO 12/24/21 1834

## 2021-12-24 NOTE — ED Triage Notes (Signed)
Pt reports bloody stool x 3 weeks. Was seen by pcp, had labs and ct performed. Pt reports she was called today and told her potassium was high. Denies cp, dizziness, shob.

## 2021-12-24 NOTE — ED Notes (Signed)
Discharge instructions reviewed with pt. Pt and family aware of GI Referral. Pt states understanding. Pt discharged Pt ambulatory with cane

## 2021-12-24 NOTE — Discharge Instructions (Addendum)
Potassium is 3.9 and normal.  Overall suspect a lab error today.

## 2022-02-01 NOTE — Patient Instructions (Addendum)
SURGICAL WAITING ROOM VISITATION Patients having surgery or a procedure may have no more than 2 support people in the waiting area - these visitors may rotate.   Children under the age of 27 must have an adult with them who is not the patient. If the patient needs to stay at the hospital during part of their recovery, the visitor guidelines for inpatient rooms apply. Pre-op nurse will coordinate an appropriate time for 1 support person to accompany patient in pre-op.  This support person may not rotate.    Please refer to the [facility] website for the visitor guidelines for Inpatients (after your surgery is over and you are in a regular room).     Your procedure is scheduled on: 02/17/22   Report to [facility] Main Entrance    Report to admitting at : 9:00 AM   Call this number if you have problems the morning of surgery [phone]   Do not eat food :After Midnight.   After Midnight you may have the following liquids until: 8:20 AM DAY OF SURGERY  Water Black Coffee (sugar ok, NO MILK/CREAM OR CREAMERS)  Tea (sugar ok, NO MILK/CREAM OR CREAMERS) regular and decaf                             Plain Jell-O (NO RED)                                           Fruit ices (not with fruit pulp, NO RED)                                     Popsicles (NO RED)                                                                  Juice: apple, WHITE grape, WHITE cranberry Sports drinks like Gatorade (NO RED)                  The day of surgery:  Drink ONE (1) Pre-Surgery Clear Ensure  at 8:20 AM the morning of surgery. Drink in one sitting. Do not sip.  This drink was given to you during your hospital  pre-op appointment visit. Nothing else to drink after completing the Pre-Surgery Clear Ensure           If you have questions, please contact your surgeon's office.   Oral Hygiene is also important to reduce your risk of infection.                                    Remember - BRUSH  YOUR TEETH THE MORNING OF SURGERY WITH YOUR REGULAR TOOTHPASTE   Do NOT smoke after Midnight  Take these medicines the morning of surgery with A SIP OF WATER: Escitalopram, Amlodipine, Pnatorprazole, Ezetimibe, Metoprolol, Prednisone, Tylenol  You may not have any metal on your body including hair pins, jewelry, and body piercing             Do not wear make-up, lotions, powders, perfumes or deodorant  Do not wear nail polish including gel and S&S, artificial/acrylic nails, or any other type of covering on natural nails including finger and toenails. If you have artificial nails, gel coating, etc. that needs to be removed by a nail salon please have this removed prior to surgery or surgery may need to be canceled/ delayed if the surgeon/ anesthesia feels like they are unable to be safely monitored.   Do not shave  48 hours prior to surgery.    Do not bring valuables to the hospital. [place].   Contacts, dentures or bridgework may not be worn into surgery.   Bring small overnight bag day of surgery.   DO NOT [place]. PHARMACY WILL DISPENSE MEDICATIONS LISTED ON YOUR MEDICATION LIST TO YOU DURING YOUR ADMISSION [place]!    Special Instructions: Bring a copy of your healthcare power of attorney and living will documents         the day of surgery if you haven't scanned them before.  Please read over the following fact sheets you were given: IF YOU HAVE QUESTIONS ABOUT YOUR PRE-OP INSTRUCTIONS PLEASE CALL [facility] - Preparing for Surgery Before surgery, you can play an important role.  Because skin is not sterile, your skin needs to be as free of germs as possible.  You can reduce the number of germs on your skin by washing with CHG (chlorahexidine gluconate) soap before surgery.  CHG is an antiseptic cleaner which kills germs and bonds with the skin to  continue killing germs even after washing. Please DO NOT use if you have an allergy to CHG or antibacterial soaps.  If your skin becomes reddened/irritated stop using the CHG and inform your nurse when you arrive at Short Stay. Do not shave (including legs and underarms) for at least 48 hours prior to the first CHG shower.  You may shave your face/neck. Please follow these instructions carefully:  1.  Shower with CHG Soap the night before surgery and the  morning of Surgery.  2.  If you choose to wash your hair, wash your hair first as usual with your  normal  shampoo.  3.  After you shampoo, rinse your hair and body thoroughly to remove the  shampoo.                           4.  Use CHG as you would any other liquid soap.  You can apply chg directly  to the skin and wash                       Gently with a scrungie or clean washcloth.  5.  Apply the CHG Soap to your body ONLY FROM THE NECK DOWN.   Do not use on face/ open                           Wound or open sores. Avoid contact with eyes, ears mouth and genitals (private parts).  Wash face,  Genitals (private parts) with your normal soap.             6.  Wash thoroughly, paying special attention to the area where your surgery  will be performed.  7.  Thoroughly rinse your body with warm water from the neck down.  8.  DO NOT shower/wash with your normal soap after using and rinsing off  the CHG Soap.                9.  Pat yourself dry with a clean towel.            10.  Wear clean pajamas.            11.  Place clean sheets on your bed the night of your first shower and do not  sleep with pets. Day of Surgery : Do not apply any lotions/deodorants the morning of surgery.  Please wear clean clothes to the hospital/surgery center.  FAILURE TO FOLLOW THESE INSTRUCTIONS MAY RESULT IN THE CANCELLATION OF YOUR SURGERY PATIENT SIGNATURE_________________________________  NURSE  SIGNATURE__________________________________  ________________________________________________________________________    [name]  An incentive spirometer is a tool that can help keep your lungs clear and active. This tool measures how well you are filling your lungs with each breath. Taking long deep breaths may help reverse or decrease the chance of developing breathing (pulmonary) problems (especially infection) following: A long period of time when you are unable to move or be active. BEFORE THE PROCEDURE  If the spirometer includes an indicator to show your best effort, your nurse or respiratory therapist will set it to a desired goal. If possible, sit up straight or lean slightly forward. Try not to slouch. Hold the incentive spirometer in an upright position. INSTRUCTIONS FOR USE  Sit on the edge of your bed if possible, or sit up as far as you can in bed or on a chair. Hold the incentive spirometer in an upright position. Breathe out normally. Place the mouthpiece in your mouth and seal your lips tightly around it. Breathe in slowly and as deeply as possible, raising the piston or the ball toward the top of the column. Hold your breath for 3-5 seconds or for as long as possible. Allow the piston or ball to fall to the bottom of the column. Remove the mouthpiece from your mouth and breathe out normally. Rest for a few seconds and repeat Steps 1 through 7 at least 10 times every 1-2 hours when you are awake. Take your time and take a few normal breaths between deep breaths. The spirometer may include an indicator to show your best effort. Use the indicator as a goal to work toward during each repetition. After each set of 10 deep breaths, practice coughing to be sure your lungs are clear. If you have an incision (the cut made at the time of surgery), support your incision when coughing by placing a pillow or rolled up towels firmly against it. Once you are able to get out of  bed, walk around indoors and cough well. You may stop using the incentive spirometer when instructed by your caregiver.  RISKS AND COMPLICATIONS Take your time so you do not get dizzy or light-headed. If you are in pain, you may need to take or ask for pain medication before doing incentive spirometry. It is harder to take a deep breath if you are having pain. AFTER USE Rest and breathe slowly and easily. It can be helpful to keep  track of a log of your progress. Your caregiver can provide you with a simple table to help with this. If you are using the spirometer at home, follow these instructions: [place] IF:  You are having difficultly using the spirometer. You have trouble using the spirometer as often as instructed. Your pain medication is not giving enough relief while using the spirometer. You develop fever of 100.5 F (38.1 C) or higher. SEEK IMMEDIATE MEDICAL CARE IF:  You cough up bloody sputum that had not been present before. You develop fever of 102 F (38.9 C) or greater. You develop worsening pain at or near the incision site. MAKE SURE YOU:  Understand these instructions. Will watch your condition. Will get help right away if you are not doing well or get worse. Document Released: 10/04/2006 Document Revised: 08/16/2011 Document Reviewed: 12/05/2006 Exit[employer] Patient Information 2014 Exit[employer], [place].   ________________________________________________________________________  WHAT IS A BLOOD TRANSFUSION? Blood Transfusion Information  A transfusion is the replacement of blood or some of its parts. Blood is made up of multiple cells which provide different functions. Red blood cells carry oxygen and are used for blood loss replacement. White blood cells fight against infection. Platelets control bleeding. Plasma helps clot blood. Other blood products are available for specialized needs, such as hemophilia or other clotting disorders. BEFORE THE TRANSFUSION   Who gives blood for transfusions?  Healthy volunteers who are fully evaluated to make sure their blood is safe. This is blood bank blood. Transfusion therapy is the safest it has ever been in the practice of medicine. Before blood is taken from a donor, a complete history is taken to make sure that person has no history of diseases nor engages in risky social behavior (examples are intravenous drug use or sexual activity with multiple partners). The donor's travel history is screened to minimize risk of transmitting infections, such as malaria. The donated blood is tested for signs of infectious diseases, such as HIV and hepatitis. The blood is then tested to be sure it is compatible with you in order to minimize the chance of a transfusion reaction. If you or a relative donates blood, this is often done in anticipation of surgery and is not appropriate for emergency situations. It takes many days to process the donated blood. RISKS AND COMPLICATIONS Although transfusion therapy is very safe and saves many lives, the main dangers of transfusion include:  Getting an infectious disease. Developing a transfusion reaction. This is an allergic reaction to something in the blood you were given. Every precaution is taken to prevent this. The decision to have a blood transfusion has been considered carefully by your caregiver before blood is given. Blood is not given unless the benefits outweigh the risks. AFTER THE TRANSFUSION Right after receiving a blood transfusion, you will usually feel much better and more energetic. This is especially true if your red blood cells have gotten low (anemic). The transfusion raises the level of the red blood cells which carry oxygen, and this usually causes an energy increase. The nurse administering the transfusion will monitor you carefully for complications. HOME CARE INSTRUCTIONS  No special instructions are needed after a transfusion. You may find your energy is  better. Speak with your caregiver about any limitations on activity for underlying diseases you may have. SEEK MEDICAL CARE IF:  Your condition is not improving after your transfusion. You develop redness or irritation at the intravenous (IV) site. SEEK IMMEDIATE MEDICAL CARE IF:  Any of the following  symptoms occur over the next 12 hours: Shaking chills. You have a temperature by mouth above 102 F (38.9 C), not controlled by medicine. Chest, back, or muscle pain. People around you feel you are not acting correctly or are confused. Shortness of breath or difficulty breathing. Dizziness and fainting. You get a rash or develop hives. You have a decrease in urine output. Your urine turns a dark color or changes to pink, red, or brown. Any of the following symptoms occur over the next 10 days: You have a temperature by mouth above 102 F (38.9 C), not controlled by medicine. Shortness of breath. Weakness after normal activity. The white part of the eye turns yellow (jaundice). You have a decrease in the amount of urine or are urinating less often. Your urine turns a dark color or changes to pink, red, or brown. Document Released: 05/21/2000 Document Revised: 08/16/2011 Document Reviewed: 01/08/2008 [facility] Patient Information 2014 [place], [place].  _______________________________________________________________________

## 2022-02-01 NOTE — Progress Notes (Signed)
Surgery orders requested via Epic inbox. °

## 2022-02-03 NOTE — H&P (Signed)
TOTAL KNEE ADMISSION H&P  Patient is being admitted for left total knee arthroplasty.  Subjective:  Chief Complaint:left knee pain.  HPI: [name], 88 y.o. female, has a history of pain and functional disability in the left knee due to arthritis and has failed non-surgical conservative treatments for greater than 12 weeks to includeNSAID's and/or analgesics, corticosteriod injections, viscosupplementation injections, flexibility and strengthening excercises, supervised PT with diminished ADL's post treatment, use of assistive devices, and activity modification.  Onset of symptoms was gradual, starting >10 years ago with gradually worsening course since that time. The patient noted prior procedures on the knee to include  arthroscopy and menisectomy on the left knee(s).  Patient currently rates pain in the left knee(s) at 6 out of 10 with activity. Patient has night pain, worsening of pain with activity and weight bearing, pain that interferes with activities of daily living, pain with passive range of motion, crepitus, and joint swelling.  Patient has evidence of periarticular osteophytes and joint space narrowing by imaging studies. This patient has had  No previous injury . There is no active infection.  Patient Active Problem List   Diagnosis Date Noted   Osteoarthritis of right knee 01/04/2020   Past Medical History:  Diagnosis Date   Arthritis    Cancer ([place])    breast Ca. on 2013   GERD (gastroesophageal reflux disease)    History of kidney stones    Hypertension    Pneumonia    Polymyalgia rheumatica ([facility])    Seizures ([place])     Past Surgical History:  Procedure Laterality Date   ABDOMINAL HYSTERECTOMY     BREAST SURGERY     CARPAL TUNNEL RELEASE Left    TONSILLECTOMY     TOTAL KNEE ARTHROPLASTY Right 01/04/2020   Procedure: TOTAL KNEE ARTHROPLASTY;  Surgeon: Sydnee Cabal, MD;  Location: [facility];  Service: Orthopedics;  Laterality: Right;  adductor canal    No  current facility-administered medications for this encounter.   Current Outpatient Medications  Medication Sig Dispense Refill Last Dose   acetaminophen (TYLENOL) 500 MG tablet Take 2 tablets (1,000 mg total) by mouth every 6 (six) hours as needed. 30 tablet 0    amLODipine (NORVASC) 5 MG tablet Take 5 mg by mouth daily.       celecoxib (CELEBREX) 200 MG capsule Take 200 mg by mouth daily.      escitalopram (LEXAPRO) 10 MG tablet Take 10 mg by mouth daily.      ezetimibe (ZETIA) 10 MG tablet Take 10 mg by mouth daily.      fluticasone (FLONASE) 50 MCG/ACT nasal spray Place 1 spray into the nose daily as needed for allergies.       hydrochlorothiazide (HYDRODIURIL) 12.5 MG tablet Take 12.5 mg by mouth daily.      methocarbamol (ROBAXIN) 500 MG tablet Take 1 tablet (500 mg total) by mouth 2 (two) times daily. (Patient not taking: Reported on 07/16/2021) 20 tablet 0    pantoprazole (PROTONIX) 40 MG tablet Take 40 mg by mouth daily.       PHENobarbital (LUMINAL) 97.2 MG tablet Take 97.2 mg by mouth at bedtime.       predniSONE (DELTASONE) 5 MG tablet Take 5 mg by mouth daily with breakfast.       Allergies  Allergen Reactions   Sulfa Antibiotics Nausea Only   Penicillins Swelling    Tolerated Cephalosporin 01/04/20      Simvastatin Swelling    Social History   Tobacco  Use   Smoking status: Never   Smokeless tobacco: Never  Substance Use Topics   Alcohol use: Never    No family history on file.   Review of Systems  All other systems reviewed and are negative.   Objective:  Physical Exam Constitutional:      Appearance: Normal appearance.  HENT:     Head: Normocephalic and atraumatic.  Cardiovascular:     Rate and Rhythm: Normal rate and regular rhythm.     Pulses: Normal pulses.  Pulmonary:     Effort: Pulmonary effort is normal.     Breath sounds: Normal breath sounds.  Musculoskeletal:     Comments: Musculoskeletal: On exam of their left knee, no skin changes or  effusions noted. Patient has tenderness with palpation over the medial and lateral joint spaces. Full extension, full flexion. Crepitus under the patella with flexion and extension.No laxity of varus valgus pressure. 5 out of 5 strength with resisted knee flexion and extension. Calf is supple. Neurovascularly intact in their left lower extremity.  Skin:    General: Skin is warm and dry.     Capillary Refill: Capillary refill takes less than 2 seconds.  Neurological:     General: No focal deficit present.     Mental Status: She is alert and oriented to person, place, and time.  Psychiatric:        Mood and Affect: Mood normal.        Behavior: Behavior normal.        Thought Content: Thought content normal.        Judgment: Judgment normal.     Vital signs in last 24 hours: BP: ()/()  Arterial Line BP: ()/()   Labs:   Estimated body mass index is 26.6 kg/m as calculated from the following:   Height as of 12/24/21: 5' 2.5" (1.588 m).   Weight as of 12/24/21: 67 kg.   Imaging Review Plain radiographs demonstrate severe degenerative joint disease of the left knee(s). The overall alignment ismild valgus. The bone quality appears to be good for age and reported activity level.      Assessment/Plan:  End stage arthritis, left knee   The patient history, physical examination, clinical judgment of the provider and imaging studies are consistent with end stage degenerative joint disease of the left knee(s) and total knee arthroplasty is deemed medically necessary. The treatment options including medical management, injection therapy arthroscopy and arthroplasty were discussed at length. The risks and benefits of total knee arthroplasty were presented and reviewed. The risks due to aseptic loosening, infection, stiffness, patella tracking problems, thromboembolic complications and other imponderables were discussed. The patient acknowledged the explanation, agreed to proceed with the plan  and consent was signed. Patient is being admitted for inpatient treatment for surgery, pain control, PT, OT, prophylactic antibiotics, VTE prophylaxis, progressive ambulation and ADL's and discharge planning. The patient is planning to be discharged home with home health services     Patient's anticipated LOS is less than 2 midnights, meeting these requirements: - Younger than 22 - Lives within 1 hour of care - Has a competent adult at home to recover with post-op recover - NO history of  - Chronic pain requiring opiods  - Diabetes  - Coronary Artery Disease  - Heart failure  - Heart attack  - Stroke  - DVT/VTE  - Cardiac arrhythmia  - Respiratory Failure/COPD  - Renal failure  - Anemia  - Advanced Liver disease

## 2022-02-04 ENCOUNTER — Encounter (HOSPITAL_COMMUNITY): Payer: Self-pay

## 2022-02-04 NOTE — Progress Notes (Addendum)
COVID Vaccine Completed:  Yes  Date of COVID positive in last 90 days:  No  PCP - Idamae Schuller, MD (note on chart) Cardiologist - Abran Richard, MD  Cardiac clearance in [facility] note dated 11-17-21 by Talbert Cage, NP  Medical clearance on chart dated 12-07-21 by Dr. Theodis Sato  Chest x-ray - 07-29-21 [facility] EKG - 12-28-21 Epic Stress Test - 10-22-21 [facility] ECHO - 07-29-21 [facility] Cardiac Cath - N/A Pacemaker/ICD device last checked: Spinal Cord Stimulator:   Heart monitor - 2023 [facility]  Bowel Prep - N/A  Sleep Study - N/A CPAP -   Fasting Blood Sugar - N/A Checks Blood Sugar _____ times a day  Blood Thinner Instructions:  N/A Aspirin Instructions: Last Dose:  Activity level:  Can go up a flight of stairs and perform activities of daily living without stopping and without symptoms of chest pain or shortness of breath.  Patient lives alone.  Some difficulty climbing stairs due to knee pain.   Anesthesia review:  Afib, mitral regurg, HTN Epilepsy, no seizures since the 80s  Patient denies shortness of breath, fever, cough and chest pain at PAT appointment  Patient verbalized understanding of instructions that were given to them at the PAT appointment. Patient was also instructed that they will need to review over the PAT instructions again at home before surgery.

## 2022-02-05 ENCOUNTER — Encounter (HOSPITAL_COMMUNITY)
Admission: RE | Admit: 2022-02-05 | Discharge: 2022-02-05 | Disposition: A | Discharge status: Home / Self Care | Payer: Medicare Other | Source: Ambulatory Visit | Attending: Specialist | Admitting: Specialist

## 2022-02-05 ENCOUNTER — Encounter (HOSPITAL_COMMUNITY): Payer: Self-pay

## 2022-02-05 ENCOUNTER — Other Ambulatory Visit: Payer: Self-pay

## 2022-02-05 VITALS — BP 156/59 | HR 54 | Temp 98.1°F | Resp 16 | Ht 62.5 in | Wt 144.4 lb

## 2022-02-05 DIAGNOSIS — M353 Polymyalgia rheumatica: Secondary | ICD-10-CM | POA: Insufficient documentation

## 2022-02-05 DIAGNOSIS — Z79899 Other long term (current) drug therapy: Secondary | ICD-10-CM | POA: Insufficient documentation

## 2022-02-05 DIAGNOSIS — I081 Rheumatic disorders of both mitral and tricuspid valves: Secondary | ICD-10-CM | POA: Insufficient documentation

## 2022-02-05 DIAGNOSIS — I2510 Atherosclerotic heart disease of native coronary artery without angina pectoris: Secondary | ICD-10-CM | POA: Diagnosis not present

## 2022-02-05 DIAGNOSIS — Z01812 Encounter for preprocedural laboratory examination: Secondary | ICD-10-CM | POA: Insufficient documentation

## 2022-02-05 DIAGNOSIS — I10 Essential (primary) hypertension: Secondary | ICD-10-CM | POA: Insufficient documentation

## 2022-02-05 DIAGNOSIS — M1711 Unilateral primary osteoarthritis, right knee: Secondary | ICD-10-CM

## 2022-02-05 DIAGNOSIS — M1712 Unilateral primary osteoarthritis, left knee: Secondary | ICD-10-CM | POA: Insufficient documentation

## 2022-02-05 DIAGNOSIS — Z01818 Encounter for other preprocedural examination: Secondary | ICD-10-CM

## 2022-02-05 HISTORY — DX: Anxiety disorder, unspecified: F41.9

## 2022-02-05 HISTORY — DX: Other chronic pain: G89.29

## 2022-02-05 HISTORY — DX: Rheumatoid arthritis, unspecified: M06.9

## 2022-02-05 HISTORY — DX: Unspecified macular degeneration: H35.30

## 2022-02-05 HISTORY — DX: Nonrheumatic mitral (valve) insufficiency: I34.0

## 2022-02-05 HISTORY — DX: Anemia, unspecified: D64.9

## 2022-02-05 HISTORY — DX: Unspecified atrial fibrillation: I48.91

## 2022-02-05 HISTORY — DX: Hyperlipidemia, unspecified: E78.5

## 2022-02-05 HISTORY — DX: Depression, unspecified: F32.A

## 2022-02-05 HISTORY — DX: Epilepsy, unspecified, not intractable, without status epilepticus: G40.909

## 2022-02-05 LAB — TYPE AND SCREEN
ABO/RH(D): A POS
Antibody Screen: NEGATIVE

## 2022-02-05 LAB — COMPREHENSIVE METABOLIC PANEL
ALT: 16 U/L (ref 0–44)
AST: 18 U/L (ref 15–41)
Albumin: 4.1 g/dL (ref 3.5–5.0)
Alkaline Phosphatase: 73 U/L (ref 38–126)
Anion gap: 7 (ref 5–15)
BUN: 13 mg/dL (ref 8–23)
CO2: 25 mmol/L (ref 22–32)
Calcium: 9.3 mg/dL (ref 8.9–10.3)
Chloride: 104 mmol/L (ref 98–111)
Creatinine, Ser: 0.64 mg/dL (ref 0.44–1.00)
GFR, Estimated: >60 mL/min (ref >60)
Glucose, Bld: 129 mg/dL — ABNORMAL HIGH (ref 70–99)
Potassium: 4.5 mmol/L (ref 3.5–5.1)
Sodium: 136 mmol/L (ref 135–145)
Total Bilirubin: 0.5 mg/dL (ref 0.3–1.2)
Total Protein: 6.6 g/dL (ref 6.5–8.1)

## 2022-02-05 LAB — CBC
HCT: 40.6 % (ref 36.0–46.0)
Hemoglobin: 13.4 g/dL (ref 12.0–15.0)
MCH: 31.5 pg (ref 26.0–34.0)
MCHC: 33 g/dL (ref 30.0–36.0)
MCV: 95.5 fL (ref 80.0–100.0)
Platelets: 188 10*3/uL (ref 150–400)
RBC: 4.25 MIL/uL (ref 3.87–5.11)
RDW: 14.3 % (ref 11.5–15.5)
WBC: 10.8 10*3/uL — ABNORMAL HIGH (ref 4.0–10.5)
nRBC: 0 % (ref 0.0–0.2)

## 2022-02-05 LAB — SURGICAL PCR SCREEN
MRSA, PCR: NEGATIVE
Staphylococcus aureus: NEGATIVE

## 2022-02-09 NOTE — Progress Notes (Signed)
Anesthesia Chart Review   Case: [id number] Date/Time: 02/17/22 1110   Procedure: TOTAL KNEE ARTHROPLASTY (Left: Knee) - adductor canal block 120   Anesthesia type: Spinal   Pre-op diagnosis: Left knee osteoarthritis   Location: [facility] ROOM 08 / [facility]   Surgeons: Sydnee Cabal, MD       DISCUSSION:88 y.o. never smoker with h/o HTN, epilepsy, atrial fibrillation, mitral regurgitation, polymyalgia rheumatica, left knee OA scheduled for above procedure 02/17/2022 with Dr. Sydnee Cabal.   Pt last seen by cardiology 11/17/2021. Per OV note, "Overall she seems to be managing well from a cardiac standpoint. She continues to remain in normal sinus rhythm with no clinical recurrence of atrial fibrillation. She did wear the Zio patch in March and did not have any recurrence of atrial fibrillation. She is not anticoagulated at this time but should she have any recurrence of atrial fibrillation then anticoagulation would then need to be considered.   Her knee surgery has not yet been rescheduled. She plans on calling to make an appoint with orthopedic. She may proceed with the proposed surgery at low risk from a cardiac standpoint. Her recent nuclear scan was negative for ischemia."  Anticipate pt can proceed with planned procedure barring acute status change.   VS: BP (!) 156/59   Pulse (!) 54   Temp 36.7 C (Oral)   Resp 16   Ht 5' 2.5" (1.588 m)   Wt 65.5 kg   LMP  (LMP Unknown)   SpO2 95%   BMI 25.99 kg/m   PROVIDERS: [name], MD is PCP   Abran Richard, MD is Cardiologist  LABS: Labs reviewed: Acceptable for surgery. (all labs ordered are listed, but only abnormal results are displayed)  Labs Reviewed  CBC - Abnormal; Notable for the following components:      Result Value   WBC 10.8 (*)    All other components within normal limits  COMPREHENSIVE METABOLIC PANEL - Abnormal; Notable for the following components:   Glucose, Bld 129 (*)    All other components within  normal limits  SURGICAL PCR SCREEN  TYPE AND SCREEN     IMAGES: Carotid duplex 11/18/20 ([facility]):  21-39% diameter reduction of the BICA.  All Doppler velocities within normal limits.  No evidence of hemodynamically significant BICA stenosis.  Vertebral artery flow is antegrade.  EKG:   CV: Stress Test 10/22/2021 IMPRESSION:   1.  Uneventful Lexiscan injection with no evidence of ischemia during  vasodilator stress.  2.  Persistent anterior wall defect with no associated wall motion  abnormality, probably most consistent with breast attenuation artifact.    No evidence of infarct or ischemia.  3.  Normal left nuclear systolic function with a calculated ejection  fraction of 85%.   Echo 07/29/2021 SUMMARY   Mitral regurgitation with mixed indeces, overall mainly supportive of  moderate severity.   There is mild concentric left ventricular hypertrophy with normal wall  motion, normal systolic function and ejection fraction '65-70%'.  There is severe [Grade III] diastolic dysfunction [restrictive filling  pattern], with elevated left atrial pressure.  The right ventricle is normal in size and function.  The left atrium is mildly dilated.  There is moderate mitral regurgitation. ERO 0.21 cm2, and reg volume  35 ml, consistent with Grade II [2+/4+ ]. regurgitation by PISA  method. Reg fraction of 66% is consistent with severe mitral  regurgitation. [No pulm vein reversal, color area is moderate range.]  There is mild to moderate tricuspid regurgitation.  Mild to moderate pulmonary hypertension.  Estimated right ventricular systolic pressure is 48 mmHg.  Past Medical History:  Diagnosis Date   Anemia    Anxiety    Arthritis    Atrial fibrillation ([place])    Cancer (HCC)    breast Ca. on 2013   Chronic back pain    Depression    Epilepsy ([place])    GERD (gastroesophageal reflux disease)    History of kidney stones    Hyperlipidemia    Hypertension    Macular degeneration     Mitral regurgitation    Pneumonia    Polymyalgia rheumatica (HCC)    Rheumatoid arthritis ([place])    Seizures ([place])     Past Surgical History:  Procedure Laterality Date   ABDOMINAL HYSTERECTOMY     BREAST SURGERY Right    Mastectomy   CARPAL TUNNEL RELEASE Left    TONSILLECTOMY     TOTAL KNEE ARTHROPLASTY Right 01/04/2020   Procedure: TOTAL KNEE ARTHROPLASTY;  Surgeon: Sydnee Cabal, MD;  Location: [facility];  Service: Orthopedics;  Laterality: Right;  adductor canal    MEDICATIONS:  acetaminophen (TYLENOL) 500 MG tablet   celecoxib (CELEBREX) 200 MG capsule   cholecalciferol (VITAMIN D3) 25 MCG (1000 UNIT) tablet   escitalopram (LEXAPRO) 10 MG tablet   ezetimibe (ZETIA) 10 MG tablet   metoprolol tartrate (LOPRESSOR) 25 MG tablet   Multiple Vitamins-Minerals (MULTIVITAMIN WITH MINERALS) tablet   pantoprazole (PROTONIX) 40 MG tablet   PHENobarbital (LUMINAL) 97.2 MG tablet   predniSONE (DELTASONE) 5 MG tablet   No current facility-administered medications for this encounter.     Konrad Felix Ward, PA-C [facility] [phone]

## 2022-02-09 NOTE — Anesthesia Preprocedure Evaluation (Addendum)
Anesthesia Evaluation  Patient identified by MRN, date of birth, ID band Patient awake    Reviewed: Allergy & Precautions, NPO status , Patient's Chart, lab work & pertinent test results, reviewed documented beta blocker date and time   Airway Mallampati: III  TM Distance: >3 FB Neck ROM: Limited    Dental no notable dental hx. (+) Dental Advisory Given   Pulmonary pneumonia, resolved,    Pulmonary exam normal breath sounds clear to auscultation       Cardiovascular hypertension, Pt. on medications and Pt. on home beta blockers Normal cardiovascular exam+ dysrhythmias Atrial Fibrillation + Valvular Problems/Murmurs MR  Rhythm:Regular Rate:Normal  EKG 12/28/2021 SB, low voltage, possible septal infarct   Neuro/Psych Seizures -, Well Controlled,  PSYCHIATRIC DISORDERS Anxiety Depression Macular degeneration    GI/Hepatic Neg liver ROS, GERD  Medicated,  Endo/Other  Hx/o breast Ca Right 2013  Renal/GU Hx/o renal calculi  negative genitourinary   Musculoskeletal  (+) Arthritis , Rheumatoid disorders,  Polymyalgia rheumatica OA left knee Chronic LBP Scoliosis   Abdominal   Peds  Hematology  (+) Blood dyscrasia, anemia ,   Anesthesia Other Findings   Reproductive/Obstetrics                           Anesthesia Physical Anesthesia Plan  ASA: 3  Anesthesia Plan: Spinal   Post-op Pain Management: Regional block* and Minimal or no pain anticipated   Induction: Intravenous  PONV Risk Score and Plan: 3 and Treatment may vary due to age or medical condition and Propofol infusion  Airway Management Planned: Natural Airway, Nasal Cannula and Simple Face Mask  Additional Equipment: None  Intra-op Plan:   Post-operative Plan:   Informed Consent: I have reviewed the patients History and Physical, chart, labs and discussed the procedure including the risks, benefits and alternatives for the  proposed anesthesia with the patient or authorized representative who has indicated his/her understanding and acceptance.     Dental advisory given  Plan Discussed with: CRNA and Anesthesiologist  Anesthesia Plan Comments: (See PAT note 02/05/2022)       Anesthesia Quick Evaluation

## 2022-02-17 ENCOUNTER — Encounter (HOSPITAL_COMMUNITY)
Admission: RE | Disposition: A | Discharge status: Skilled Nursing Facility | Payer: Self-pay | Source: Home / Self Care | Attending: Specialist

## 2022-02-17 ENCOUNTER — Other Ambulatory Visit: Payer: Self-pay

## 2022-02-17 ENCOUNTER — Encounter (HOSPITAL_COMMUNITY): Payer: Self-pay | Admitting: Specialist

## 2022-02-17 ENCOUNTER — Ambulatory Visit (HOSPITAL_BASED_OUTPATIENT_CLINIC_OR_DEPARTMENT_OTHER): Payer: Medicare Other | Admitting: Certified Registered Nurse Anesthetist

## 2022-02-17 ENCOUNTER — Ambulatory Visit (HOSPITAL_COMMUNITY): Payer: Medicare Other | Admitting: Physician Assistant

## 2022-02-17 ENCOUNTER — Inpatient Hospital Stay (HOSPITAL_COMMUNITY)
Admission: RE | Admit: 2022-02-17 | Discharge: 2022-03-03 | DRG: 469 | Disposition: A | Discharge status: Skilled Nursing Facility | Payer: Medicare Other | Attending: Specialist | Admitting: Specialist

## 2022-02-17 DIAGNOSIS — I2510 Atherosclerotic heart disease of native coronary artery without angina pectoris: Secondary | ICD-10-CM

## 2022-02-17 DIAGNOSIS — I272 Pulmonary hypertension, unspecified: Secondary | ICD-10-CM | POA: Diagnosis present

## 2022-02-17 DIAGNOSIS — I959 Hypotension, unspecified: Secondary | ICD-10-CM | POA: Diagnosis not present

## 2022-02-17 DIAGNOSIS — R197 Diarrhea, unspecified: Secondary | ICD-10-CM | POA: Diagnosis not present

## 2022-02-17 DIAGNOSIS — F419 Anxiety disorder, unspecified: Secondary | ICD-10-CM | POA: Diagnosis present

## 2022-02-17 DIAGNOSIS — F418 Other specified anxiety disorders: Secondary | ICD-10-CM

## 2022-02-17 DIAGNOSIS — E871 Hypo-osmolality and hyponatremia: Secondary | ICD-10-CM | POA: Diagnosis present

## 2022-02-17 DIAGNOSIS — Z7982 Long term (current) use of aspirin: Secondary | ICD-10-CM

## 2022-02-17 DIAGNOSIS — J9811 Atelectasis: Secondary | ICD-10-CM | POA: Diagnosis not present

## 2022-02-17 DIAGNOSIS — T462X5A Adverse effect of other antidysrhythmic drugs, initial encounter: Secondary | ICD-10-CM | POA: Diagnosis not present

## 2022-02-17 DIAGNOSIS — Z01818 Encounter for other preprocedural examination: Secondary | ICD-10-CM

## 2022-02-17 DIAGNOSIS — J9601 Acute respiratory failure with hypoxia: Secondary | ICD-10-CM | POA: Diagnosis not present

## 2022-02-17 DIAGNOSIS — M549 Dorsalgia, unspecified: Secondary | ICD-10-CM | POA: Diagnosis present

## 2022-02-17 DIAGNOSIS — Z96652 Presence of left artificial knee joint: Secondary | ICD-10-CM

## 2022-02-17 DIAGNOSIS — R000 Tachycardia, unspecified: Secondary | ICD-10-CM | POA: Diagnosis not present

## 2022-02-17 DIAGNOSIS — N179 Acute kidney failure, unspecified: Secondary | ICD-10-CM

## 2022-02-17 DIAGNOSIS — Z7901 Long term (current) use of anticoagulants: Secondary | ICD-10-CM

## 2022-02-17 DIAGNOSIS — G8929 Other chronic pain: Secondary | ICD-10-CM | POA: Diagnosis present

## 2022-02-17 DIAGNOSIS — E669 Obesity, unspecified: Secondary | ICD-10-CM | POA: Diagnosis present

## 2022-02-17 DIAGNOSIS — Z7952 Long term (current) use of systemic steroids: Secondary | ICD-10-CM

## 2022-02-17 DIAGNOSIS — T380X5A Adverse effect of glucocorticoids and synthetic analogues, initial encounter: Secondary | ICD-10-CM | POA: Diagnosis not present

## 2022-02-17 DIAGNOSIS — F32A Depression, unspecified: Secondary | ICD-10-CM | POA: Diagnosis present

## 2022-02-17 DIAGNOSIS — E2740 Unspecified adrenocortical insufficiency: Secondary | ICD-10-CM | POA: Diagnosis present

## 2022-02-17 DIAGNOSIS — J189 Pneumonia, unspecified organism: Secondary | ICD-10-CM | POA: Diagnosis not present

## 2022-02-17 DIAGNOSIS — M1712 Unilateral primary osteoarthritis, left knee: Secondary | ICD-10-CM | POA: Diagnosis not present

## 2022-02-17 DIAGNOSIS — Z880 Allergy status to penicillin: Secondary | ICD-10-CM

## 2022-02-17 DIAGNOSIS — Z853 Personal history of malignant neoplasm of breast: Secondary | ICD-10-CM

## 2022-02-17 DIAGNOSIS — Z79899 Other long term (current) drug therapy: Secondary | ICD-10-CM

## 2022-02-17 DIAGNOSIS — E785 Hyperlipidemia, unspecified: Secondary | ICD-10-CM | POA: Diagnosis present

## 2022-02-17 DIAGNOSIS — I4891 Unspecified atrial fibrillation: Secondary | ICD-10-CM

## 2022-02-17 DIAGNOSIS — H3530 Unspecified macular degeneration: Secondary | ICD-10-CM | POA: Diagnosis present

## 2022-02-17 DIAGNOSIS — M069 Rheumatoid arthritis, unspecified: Secondary | ICD-10-CM | POA: Diagnosis present

## 2022-02-17 DIAGNOSIS — D62 Acute posthemorrhagic anemia: Secondary | ICD-10-CM | POA: Diagnosis not present

## 2022-02-17 DIAGNOSIS — Z6825 Body mass index (BMI) 25.0-25.9, adult: Secondary | ICD-10-CM

## 2022-02-17 DIAGNOSIS — Z87442 Personal history of urinary calculi: Secondary | ICD-10-CM

## 2022-02-17 DIAGNOSIS — Y95 Nosocomial condition: Secondary | ICD-10-CM | POA: Diagnosis not present

## 2022-02-17 DIAGNOSIS — E222 Syndrome of inappropriate secretion of antidiuretic hormone: Secondary | ICD-10-CM | POA: Diagnosis not present

## 2022-02-17 DIAGNOSIS — Z96653 Presence of artificial knee joint, bilateral: Secondary | ICD-10-CM | POA: Diagnosis present

## 2022-02-17 DIAGNOSIS — I493 Ventricular premature depolarization: Secondary | ICD-10-CM | POA: Diagnosis present

## 2022-02-17 DIAGNOSIS — J440 Chronic obstructive pulmonary disease with acute lower respiratory infection: Secondary | ICD-10-CM | POA: Diagnosis not present

## 2022-02-17 DIAGNOSIS — I10 Essential (primary) hypertension: Secondary | ICD-10-CM

## 2022-02-17 DIAGNOSIS — Z888 Allergy status to other drugs, medicaments and biological substances status: Secondary | ICD-10-CM

## 2022-02-17 DIAGNOSIS — M353 Polymyalgia rheumatica: Secondary | ICD-10-CM | POA: Diagnosis present

## 2022-02-17 DIAGNOSIS — K219 Gastro-esophageal reflux disease without esophagitis: Secondary | ICD-10-CM | POA: Diagnosis present

## 2022-02-17 DIAGNOSIS — Z8701 Personal history of pneumonia (recurrent): Secondary | ICD-10-CM

## 2022-02-17 DIAGNOSIS — I340 Nonrheumatic mitral (valve) insufficiency: Secondary | ICD-10-CM | POA: Diagnosis present

## 2022-02-17 DIAGNOSIS — R339 Retention of urine, unspecified: Secondary | ICD-10-CM | POA: Diagnosis not present

## 2022-02-17 DIAGNOSIS — Z90710 Acquired absence of both cervix and uterus: Secondary | ICD-10-CM

## 2022-02-17 DIAGNOSIS — G4710 Hypersomnia, unspecified: Secondary | ICD-10-CM | POA: Diagnosis present

## 2022-02-17 DIAGNOSIS — R578 Other shock: Secondary | ICD-10-CM | POA: Diagnosis not present

## 2022-02-17 DIAGNOSIS — E876 Hypokalemia: Secondary | ICD-10-CM | POA: Diagnosis not present

## 2022-02-17 DIAGNOSIS — M1711 Unilateral primary osteoarthritis, right knee: Principal | ICD-10-CM

## 2022-02-17 DIAGNOSIS — Z882 Allergy status to sulfonamides status: Secondary | ICD-10-CM

## 2022-02-17 DIAGNOSIS — M419 Scoliosis, unspecified: Secondary | ICD-10-CM | POA: Diagnosis present

## 2022-02-17 DIAGNOSIS — K5900 Constipation, unspecified: Secondary | ICD-10-CM | POA: Diagnosis not present

## 2022-02-17 DIAGNOSIS — M545 Low back pain, unspecified: Secondary | ICD-10-CM | POA: Diagnosis present

## 2022-02-17 DIAGNOSIS — Z66 Do not resuscitate: Secondary | ICD-10-CM

## 2022-02-17 DIAGNOSIS — I480 Paroxysmal atrial fibrillation: Secondary | ICD-10-CM | POA: Diagnosis present

## 2022-02-17 DIAGNOSIS — G40909 Epilepsy, unspecified, not intractable, without status epilepticus: Secondary | ICD-10-CM | POA: Diagnosis present

## 2022-02-17 DIAGNOSIS — G9341 Metabolic encephalopathy: Secondary | ICD-10-CM | POA: Diagnosis present

## 2022-02-17 HISTORY — PX: TOTAL KNEE ARTHROPLASTY: SHX125

## 2022-02-17 LAB — CBC
HCT: 42.8 % (ref 36.0–46.0)
Hemoglobin: 13.6 g/dL (ref 12.0–15.0)
MCH: 31.2 pg (ref 26.0–34.0)
MCHC: 31.8 g/dL (ref 30.0–36.0)
MCV: 98.2 fL (ref 80.0–100.0)
Platelets: 173 10*3/uL (ref 150–400)
RBC: 4.36 MIL/uL (ref 3.87–5.11)
RDW: 13.9 % (ref 11.5–15.5)
WBC: 13.5 10*3/uL — ABNORMAL HIGH (ref 4.0–10.5)
nRBC: 0 % (ref 0.0–0.2)

## 2022-02-17 LAB — CREATININE, SERUM
Creatinine, Ser: 0.6 mg/dL (ref 0.44–1.00)
GFR, Estimated: >60 mL/min (ref >60)

## 2022-02-17 SURGERY — ARTHROPLASTY, KNEE, TOTAL
Anesthesia: Spinal | Site: Knee | Laterality: Left

## 2022-02-17 MED ORDER — ONDANSETRON HCL 4 MG/2ML IJ SOLN: 4.0000 mg | Freq: Once | INTRAMUSCULAR | Status: DC | PRN

## 2022-02-17 MED ORDER — METHOCARBAMOL 500 MG PO TABS
500.0000 mg | ORAL_TABLET | Freq: Four times a day (QID) | ORAL | Status: DC | PRN
  Administered 2022-02-18 – 2022-03-02 (×2): 500 mg via ORAL
  Filled 2022-02-17 (×3): qty 1

## 2022-02-17 MED ORDER — POVIDONE-IODINE 10 % EX SWAB
2.0000 | Freq: Once | CUTANEOUS | Status: AC
  Administered 2022-02-17: 2 via TOPICAL

## 2022-02-17 MED ORDER — PHENYLEPHRINE HCL-NACL 20-0.9 MG/250ML-% IV SOLN
INTRAVENOUS | Status: DC | PRN
  Administered 2022-02-17: 35 ug/min via INTRAVENOUS

## 2022-02-17 MED ORDER — PROPOFOL 500 MG/50ML IV EMUL
INTRAVENOUS | Status: DC | PRN
  Administered 2022-02-17: 50 ug/kg/min via INTRAVENOUS

## 2022-02-17 MED ORDER — METHOCARBAMOL 500 MG IVPB - SIMPLE MED: 500.0000 mg | Freq: Four times a day (QID) | INTRAVENOUS | Status: DC | PRN

## 2022-02-17 MED ORDER — OXYCODONE HCL 5 MG PO TABS
10.0000 mg | ORAL_TABLET | ORAL | Status: DC | PRN
  Administered 2022-02-18 (×2): 10 mg via ORAL

## 2022-02-17 MED ORDER — PROPOFOL 10 MG/ML IV BOLUS
INTRAVENOUS | Status: DC | PRN
  Administered 2022-02-17 (×3): 10 mg via INTRAVENOUS

## 2022-02-17 MED ORDER — EPHEDRINE 5 MG/ML INJ
INTRAVENOUS | Status: AC
  Filled 2022-02-17: qty 5

## 2022-02-17 MED ORDER — ONDANSETRON HCL 4 MG PO TABS: 4.0000 mg | ORAL_TABLET | Freq: Four times a day (QID) | ORAL | Status: DC | PRN

## 2022-02-17 MED ORDER — ONDANSETRON HCL 4 MG/2ML IJ SOLN
4.0000 mg | Freq: Four times a day (QID) | INTRAMUSCULAR | Status: DC | PRN
  Administered 2022-02-20: 4 mg via INTRAVENOUS
  Filled 2022-02-17: qty 2

## 2022-02-17 MED ORDER — FENTANYL CITRATE PF 50 MCG/ML IJ SOSY
50.0000 ug | PREFILLED_SYRINGE | INTRAMUSCULAR | Status: DC
  Administered 2022-02-17: 50 ug via INTRAVENOUS
  Filled 2022-02-17: qty 2

## 2022-02-17 MED ORDER — FENTANYL CITRATE PF 50 MCG/ML IJ SOSY: 25.0000 ug | PREFILLED_SYRINGE | INTRAMUSCULAR | Status: DC | PRN

## 2022-02-17 MED ORDER — EPHEDRINE SULFATE-NACL 50-0.9 MG/10ML-% IV SOSY
PREFILLED_SYRINGE | INTRAVENOUS | Status: DC | PRN
  Administered 2022-02-17: 5 mg via INTRAVENOUS

## 2022-02-17 MED ORDER — SODIUM CHLORIDE 0.9 % IV SOLN
INTRAVENOUS | Status: DC | PRN
  Administered 2022-02-17: 80 mL

## 2022-02-17 MED ORDER — LACTATED RINGERS IV SOLN: INTRAVENOUS | Status: DC

## 2022-02-17 MED ORDER — SODIUM CHLORIDE (PF) 0.9 % IJ SOLN
INTRAMUSCULAR | Status: AC
  Filled 2022-02-17: qty 10

## 2022-02-17 MED ORDER — OXYCODONE HCL 5 MG PO TABS: 5.0000 mg | ORAL_TABLET | Freq: Once | ORAL | Status: DC | PRN

## 2022-02-17 MED ORDER — CEFAZOLIN SODIUM-DEXTROSE 2-4 GM/100ML-% IV SOLN
2.0000 g | INTRAVENOUS | Status: AC
  Administered 2022-02-17: 2 g via INTRAVENOUS
  Filled 2022-02-17: qty 100

## 2022-02-17 MED ORDER — CHLORHEXIDINE GLUCONATE 0.12 % MT SOLN
15.0000 mL | Freq: Once | OROMUCOSAL | Status: AC
  Administered 2022-02-17: 15 mL via OROMUCOSAL

## 2022-02-17 MED ORDER — SODIUM CHLORIDE 0.9 % IV SOLN: INTRAVENOUS | Status: DC

## 2022-02-17 MED ORDER — ROPIVACAINE HCL 5 MG/ML IJ SOLN
INTRAMUSCULAR | Status: DC | PRN
  Administered 2022-02-17: 30 mL via PERINEURAL

## 2022-02-17 MED ORDER — DEXAMETHASONE SODIUM PHOSPHATE 10 MG/ML IJ SOLN
8.0000 mg | Freq: Once | INTRAMUSCULAR | Status: AC
  Administered 2022-02-17: 8 mg via INTRAVENOUS

## 2022-02-17 MED ORDER — ENOXAPARIN SODIUM 40 MG/0.4ML IJ SOSY
40.0000 mg | PREFILLED_SYRINGE | INTRAMUSCULAR | Status: DC
  Administered 2022-02-18 – 2022-02-22 (×5): 40 mg via SUBCUTANEOUS
  Filled 2022-02-17 (×5): qty 0.4

## 2022-02-17 MED ORDER — METOPROLOL TARTRATE 25 MG PO TABS
25.0000 mg | ORAL_TABLET | Freq: Two times a day (BID) | ORAL | Status: DC
  Administered 2022-02-17 – 2022-02-20 (×7): 25 mg via ORAL
  Filled 2022-02-17 (×7): qty 1

## 2022-02-17 MED ORDER — TRANEXAMIC ACID-NACL 1000-0.7 MG/100ML-% IV SOLN
1000.0000 mg | INTRAVENOUS | Status: AC
  Administered 2022-02-17: 1000 mg via INTRAVENOUS
  Filled 2022-02-17: qty 100

## 2022-02-17 MED ORDER — ONDANSETRON HCL 4 MG/2ML IJ SOLN
INTRAMUSCULAR | Status: AC
  Filled 2022-02-17: qty 2

## 2022-02-17 MED ORDER — ONDANSETRON HCL 4 MG/2ML IJ SOLN
INTRAMUSCULAR | Status: DC | PRN
  Administered 2022-02-17: 4 mg via INTRAVENOUS

## 2022-02-17 MED ORDER — BUPIVACAINE IN DEXTROSE 0.75-8.25 % IT SOLN
INTRATHECAL | Status: DC | PRN
  Administered 2022-02-17: 1.6 mL via INTRATHECAL

## 2022-02-17 MED ORDER — PROPOFOL 1000 MG/100ML IV EMUL
INTRAVENOUS | Status: AC
  Filled 2022-02-17: qty 100

## 2022-02-17 MED ORDER — OXYCODONE HCL 5 MG/5ML PO SOLN: 5.0000 mg | Freq: Once | ORAL | Status: DC | PRN

## 2022-02-17 MED ORDER — BUPIVACAINE LIPOSOME 1.3 % IJ SUSP
INTRAMUSCULAR | Status: AC
  Filled 2022-02-17: qty 20

## 2022-02-17 MED ORDER — ACETAMINOPHEN 325 MG PO TABS
325.0000 mg | ORAL_TABLET | Freq: Four times a day (QID) | ORAL | Status: DC | PRN
  Administered 2022-02-18 – 2022-03-03 (×12): 650 mg via ORAL
  Filled 2022-02-17 (×12): qty 2

## 2022-02-17 MED ORDER — BUPIVACAINE LIPOSOME 1.3 % IJ SUSP: 20.0000 mL | Freq: Once | INTRAMUSCULAR | Status: DC

## 2022-02-17 MED ORDER — HYDROMORPHONE HCL 1 MG/ML IJ SOLN
0.5000 mg | INTRAMUSCULAR | Status: DC | PRN
  Administered 2022-02-17 (×2): 0.5 mg via INTRAVENOUS
  Administered 2022-02-22 – 2022-02-23 (×3): 1 mg via INTRAVENOUS
  Filled 2022-02-17 (×5): qty 1

## 2022-02-17 MED ORDER — ESCITALOPRAM OXALATE 20 MG PO TABS
10.0000 mg | ORAL_TABLET | Freq: Every day | ORAL | Status: DC
  Administered 2022-02-18 – 2022-02-20 (×3): 10 mg via ORAL
  Filled 2022-02-17 (×3): qty 1

## 2022-02-17 MED ORDER — PHENOBARBITAL 97.2 MG PO TABS
97.2000 mg | ORAL_TABLET | Freq: Every day | ORAL | Status: DC
  Administered 2022-02-17 – 2022-03-02 (×14): 97.2 mg via ORAL
  Filled 2022-02-17 (×2): qty 3
  Filled 2022-02-17 (×5): qty 1
  Filled 2022-02-17 (×2): qty 3
  Filled 2022-02-17: qty 1
  Filled 2022-02-17 (×3): qty 3
  Filled 2022-02-17: qty 1

## 2022-02-17 MED ORDER — TRAMADOL HCL 50 MG PO TABS
50.0000 mg | ORAL_TABLET | Freq: Four times a day (QID) | ORAL | Status: DC
  Administered 2022-02-17 – 2022-02-19 (×7): 50 mg via ORAL
  Filled 2022-02-17 (×7): qty 1

## 2022-02-17 MED ORDER — 0.9 % SODIUM CHLORIDE (POUR BTL) OPTIME
TOPICAL | Status: DC | PRN
  Administered 2022-02-17: 1000 mL

## 2022-02-17 MED ORDER — SODIUM CHLORIDE (PF) 0.9 % IJ SOLN
INTRAMUSCULAR | Status: AC
  Filled 2022-02-17: qty 50

## 2022-02-17 MED ORDER — DIPHENHYDRAMINE HCL 12.5 MG/5ML PO ELIX: 12.5000 mg | ORAL_SOLUTION | ORAL | Status: DC | PRN

## 2022-02-17 MED ORDER — MAGNESIUM CITRATE PO SOLN: 1.0000 | Freq: Once | ORAL | Status: DC | PRN

## 2022-02-17 MED ORDER — EZETIMIBE 10 MG PO TABS
10.0000 mg | ORAL_TABLET | Freq: Every day | ORAL | Status: DC
  Administered 2022-02-17 – 2022-03-03 (×15): 10 mg via ORAL
  Filled 2022-02-17 (×15): qty 1

## 2022-02-17 MED ORDER — DEXAMETHASONE SODIUM PHOSPHATE 10 MG/ML IJ SOLN
INTRAMUSCULAR | Status: AC
  Filled 2022-02-17: qty 1

## 2022-02-17 MED ORDER — OXYCODONE HCL 5 MG PO TABS
5.0000 mg | ORAL_TABLET | ORAL | Status: DC | PRN
  Administered 2022-02-18: 10 mg via ORAL
  Filled 2022-02-17 (×3): qty 2

## 2022-02-17 MED ORDER — SODIUM CHLORIDE 0.9 % IR SOLN
Status: DC | PRN
  Administered 2022-02-17: 1000 mL

## 2022-02-17 MED ORDER — BISACODYL 5 MG PO TBEC
5.0000 mg | DELAYED_RELEASE_TABLET | Freq: Every day | ORAL | Status: DC | PRN
  Administered 2022-02-22: 5 mg via ORAL
  Filled 2022-02-17: qty 1

## 2022-02-17 MED ORDER — CEFAZOLIN SODIUM-DEXTROSE 1-4 GM/50ML-% IV SOLN
1.0000 g | Freq: Four times a day (QID) | INTRAVENOUS | Status: AC
  Administered 2022-02-17 – 2022-02-18 (×3): 1 g via INTRAVENOUS
  Filled 2022-02-17 (×3): qty 50

## 2022-02-17 MED ORDER — PANTOPRAZOLE SODIUM 40 MG PO TBEC
40.0000 mg | DELAYED_RELEASE_TABLET | Freq: Every day | ORAL | Status: DC
  Administered 2022-02-18 – 2022-03-03 (×14): 40 mg via ORAL
  Filled 2022-02-17 (×14): qty 1

## 2022-02-17 MED ORDER — ORAL CARE MOUTH RINSE: 15.0000 mL | Freq: Once | OROMUCOSAL | Status: AC

## 2022-02-17 MED ORDER — ACETAMINOPHEN 500 MG PO TABS
1000.0000 mg | ORAL_TABLET | Freq: Four times a day (QID) | ORAL | Status: AC
  Administered 2022-02-17 – 2022-02-18 (×4): 1000 mg via ORAL
  Filled 2022-02-17 (×4): qty 2

## 2022-02-17 MED ORDER — SENNOSIDES-DOCUSATE SODIUM 8.6-50 MG PO TABS
1.0000 | ORAL_TABLET | Freq: Every evening | ORAL | Status: DC | PRN
  Administered 2022-02-21: 1 via ORAL
  Filled 2022-02-17: qty 1

## 2022-02-17 SURGICAL SUPPLY — 51 items
ATTUNE MED DOME PAT 32 KNEE (Knees) IMPLANT
ATTUNE PSFEM LTSZ4 NARCEM KNEE (Femur) IMPLANT
ATTUNE PSRP INSR SZ4 6 KNEE (Insert) IMPLANT
BAG COUNTER SPONGE SURGICOUNT (BAG) ×1 IMPLANT
BAG ZIPLOCK 12X15 (MISCELLANEOUS) ×1 IMPLANT
BASE TIBIAL ROT PLAT SZ 3 KNEE (Knees) IMPLANT
BLADE SAG 18X100X1.27 (BLADE) ×1 IMPLANT
BLADE SAW SGTL 11.0X1.19X90.0M (BLADE) ×1 IMPLANT
BNDG ELASTIC 4X5.8 VLCR STR LF (GAUZE/BANDAGES/DRESSINGS) ×1 IMPLANT
BNDG ELASTIC 6X5.8 VLCR STR LF (GAUZE/BANDAGES/DRESSINGS) ×1 IMPLANT
BOWL SMART MIX CTS (DISPOSABLE) ×1 IMPLANT
CEMENT HV SMART SET (Cement) IMPLANT
COVER SURGICAL LIGHT HANDLE (MISCELLANEOUS) ×1 IMPLANT
CUFF TOURN SGL QUICK 34 (TOURNIQUET CUFF) ×1
CUFF TRNQT CYL 34X4.125X (TOURNIQUET CUFF) ×1 IMPLANT
DERMABOND ADVANCED .7 DNX12 (GAUZE/BANDAGES/DRESSINGS) ×1 IMPLANT
DRAPE INCISE IOBAN 66X45 STRL (DRAPES) ×1 IMPLANT
DRAPE U-SHAPE 47X51 STRL (DRAPES) ×1 IMPLANT
DRSG AQUACEL AG ADV 3.5X10 (GAUZE/BANDAGES/DRESSINGS) ×1 IMPLANT
DURAPREP 26ML APPLICATOR (WOUND CARE) ×2 IMPLANT
ELECT REM PT RETURN 15FT ADLT (MISCELLANEOUS) ×1 IMPLANT
GLOVE BIOGEL PI IND STRL 7.5 (GLOVE) ×1 IMPLANT
GLOVE BIOGEL PI IND STRL 8 (GLOVE) ×1 IMPLANT
GLOVE ECLIPSE 8.0 STRL XLNG CF (GLOVE) ×1 IMPLANT
GLOVE SURG ORTHO 9.0 STRL STRW (GLOVE) ×1 IMPLANT
GLOVE SURG SS PI 7.0 STRL IVOR (GLOVE) ×1 IMPLANT
GOWN SPEC L4 XLG W/TWL (GOWN DISPOSABLE) ×2 IMPLANT
HANDPIECE INTERPULSE COAX TIP (DISPOSABLE) ×1
KIT TURNOVER KIT A (KITS) IMPLANT
NS IRRIG 1000ML POUR BTL (IV SOLUTION) ×1 IMPLANT
PACK TOTAL KNEE CUSTOM (KITS) ×1 IMPLANT
PROTECTOR NERVE ULNAR (MISCELLANEOUS) ×1 IMPLANT
SET HNDPC FAN SPRY TIP SCT (DISPOSABLE) ×1 IMPLANT
SET PAD KNEE POSITIONER (MISCELLANEOUS) ×1 IMPLANT
SOLUTION IRRIG SURGIPHOR (IV SOLUTION) ×1 IMPLANT
SPIKE FLUID TRANSFER (MISCELLANEOUS) ×1 IMPLANT
SPONGE SURGIFOAM ABS GEL 100 (HEMOSTASIS) ×1 IMPLANT
STOCKINETTE 6  STRL (DRAPES) ×1
STOCKINETTE 6 STRL (DRAPES) ×1 IMPLANT
SUT MNCRL AB 3-0 PS2 18 (SUTURE) ×1 IMPLANT
SUT VIC AB 1 CT1 27 (SUTURE) ×3
SUT VIC AB 1 CT1 27XBRD ANTBC (SUTURE) ×3 IMPLANT
SUT VIC AB 2-0 CT1 27 (SUTURE) ×2
SUT VIC AB 2-0 CT1 TAPERPNT 27 (SUTURE) ×2 IMPLANT
SUT VLOC 180 0 24IN GS25 (SUTURE) ×1 IMPLANT
SYR 50ML LL SCALE MARK (SYRINGE) ×1 IMPLANT
TAPE STRIPS DRAPE STRL (GAUZE/BANDAGES/DRESSINGS) ×1 IMPLANT
TIBIAL BASE ROT PLAT SZ 3 KNEE (Knees) ×1 IMPLANT
TRAY CATH INTERMITTENT SS 16FR (CATHETERS) ×1 IMPLANT
WATER STERILE IRR 1000ML POUR (IV SOLUTION) ×2 IMPLANT
WRAP KNEE MAXI GEL POST OP (GAUZE/BANDAGES/DRESSINGS) IMPLANT

## 2022-02-17 NOTE — Anesthesia Procedure Notes (Signed)
Procedure Name: MAC Date/Time: 02/17/2022 11:36 AM  Performed by: Maxwell Caul, CRNAPre-anesthesia Checklist: Patient identified, Emergency Drugs available, Suction available and Patient being monitored Oxygen Delivery Method: Simple face mask

## 2022-02-17 NOTE — Interval H&P Note (Signed)
History and Physical Interval Note:  02/17/2022 11:02 AM  [name]  has presented today for surgery, with the diagnosis of Left knee osteoarthritis.  The various methods of treatment have been discussed with the patient and family. After consideration of risks, benefits and other options for treatment, the patient has consented to  Procedure(s) with comments: TOTAL KNEE ARTHROPLASTY (Left) - adductor canal block 120 as a surgical intervention.  The patient's history has been reviewed, patient examined, no change in status, stable for surgery.  I have reviewed the patient's chart and labs.  Questions were answered to the patient's satisfaction.     [name] ANDREW

## 2022-02-17 NOTE — Anesthesia Postprocedure Evaluation (Signed)
Anesthesia Post Note  Patient: [name]  Procedure(s) Performed: TOTAL KNEE ARTHROPLASTY (Left: Knee)     Patient location during evaluation: PACU Anesthesia Type: Spinal Level of consciousness: oriented and awake and alert Pain management: pain level controlled Vital Signs Assessment: post-procedure vital signs reviewed and stable Respiratory status: spontaneous breathing, respiratory function stable, nonlabored ventilation and patient connected to nasal cannula oxygen Cardiovascular status: blood pressure returned to baseline and stable Postop Assessment: no headache, no backache, no apparent nausea or vomiting and spinal receding Anesthetic complications: no   No notable events documented.  Last Vitals:  Vitals:   02/17/22 1415 02/17/22 1430  BP: (!) 136/57 (!) 148/60  Pulse: 61 65  Resp: 19 14  Temp:    SpO2: 90% 92%    Last Pain:  Vitals:   02/17/22 1456  TempSrc:   PainSc: 0-No pain                 Ahyan Kreeger A.

## 2022-02-17 NOTE — Transfer of Care (Signed)
Immediate Anesthesia Transfer of Care Note  Patient: [name]  Procedure(s) Performed: TOTAL KNEE ARTHROPLASTY (Left: Knee)  Patient Location: PACU  Anesthesia Type:Spinal  Level of Consciousness: awake, alert  and oriented  Airway & Oxygen Therapy: Patient Spontanous Breathing and Patient connected to face mask oxygen  Post-op Assessment: Report given to RN and Post -op Vital signs reviewed and stable  Post vital signs: Reviewed and stable  Last Vitals:  Vitals Value Taken Time  BP 123/58 02/17/22 1358  Temp    Pulse 69 02/17/22 1400  Resp 18 02/17/22 1400  SpO2 91 % 02/17/22 1400  Vitals shown include unvalidated device data.  Last Pain:  Vitals:   02/17/22 1056  TempSrc:   PainSc: 0-No pain         Complications: No notable events documented.

## 2022-02-17 NOTE — Anesthesia Procedure Notes (Signed)
Spinal  Patient location during procedure: OR Start time: 02/17/2022 11:44 AM End time: 02/17/2022 11:53 AM Reason for block: surgical anesthesia Staffing Performed: anesthesiologist  Anesthesiologist: Josephine Igo, MD Performed by: Josephine Igo, MD Authorized by: Josephine Igo, MD   Preanesthetic Checklist Completed: patient identified, IV checked, site marked, risks and benefits discussed, surgical consent, monitors and equipment checked, pre-op evaluation and timeout performed Spinal Block Patient position: sitting Prep: DuraPrep and site prepped and draped Patient monitoring: heart rate, cardiac monitor, continuous pulse ox and blood pressure Approach: midline Location: L3-4 Injection technique: single-shot Needle Needle type: Pencan  Needle gauge: 24 G Needle length: 9 cm Needle insertion depth: 6 cm Assessment Sensory level: T6 Events: CSF return Additional Notes Patient tolerated procedure well. Adequate sensory level. Multiple attempts. Technically difficult due to arthritis and scoliosis.

## 2022-02-17 NOTE — Op Note (Signed)
DATE OF SURGERY:  02/17/2022  TIME: 1:42 PM  PATIENT NAME:  [name]    AGE: 88 y.o.   PRE-OPERATIVE DIAGNOSIS:  Left knee osteoarthritis  POST-OPERATIVE DIAGNOSIS:  Left knee osteoarthritis  PROCEDURE:  Procedure(s): TOTAL KNEE ARTHROPLASTY  SURGEON:  Redell Bhandari ANDREW  ASSISTANT:  Leeanne Haus, PA-C, present and scrubbed throughout the case, critical for assistance with exposure, retraction, instrumentation, and closure.  OPERATIVE IMPLANTS: Depuy PFC Attune Rotating Platform.  Femur size 4, Tibia size 3, Patella size 32 3-peg oval button, with a 6 mm polyethylene insert.   PREOPERATIVE INDICATIONS:   [name] is a 88 y.o. year old female with end stage bone on bone arthritis of the knee who failed conservative treatment and elected for Total Knee Arthroplasty.   The risks, benefits, and alternatives were discussed at length including but not limited to the risks of infection, bleeding, nerve injury, stiffness, blood clots, the need for revision surgery, cardiopulmonary complications, among others, and they were willing to proceed.  OPERATIVE DESCRIPTION:  The patient was brought to the operative room and placed in a supine position.  Spinal anesthesia was administered.  IV antibiotics were given.  The lower extremity was prepped and draped in the usual sterile fashion.  Time out was performed.  The leg was elevated and exsanguinated and the tourniquet was inflated.  Anterior quadriceps tendon splitting approach was performed.  The patella was retracted and osteophytes were removed.  The anterior horn of the medial and lateral meniscus was removed and cruciate ligaments resected.   The distal femur was opened with the drill and the intramedullary distal femoral cutting jig was utilized, set at 5 degrees resecting 10 mm off the distal femur.  Care was taken to protect the collateral ligaments.  The distal femoral sizing jig was applied, taking care to avoid  notching.  Then the 4-in-1 cutting jig was applied and the anterior and posterior femur was cut, along with the chamfer cuts.    Then the extramedullary tibial cutting jig was utilized making the appropriate cut using the anterior tibial crest as a reference building in appropriate posterior slope.  Care was taken during the cut to protect the medial and collateral ligaments.  The proximal tibia was removed along with the posterior horns of the menisci.   The posterior medial femoral osteophytes and posterior lateral femoral osteophytes were removed.    The flexion gap was then measured and was symmetric with the extension gap, measured at 6.  I completed the distal femoral preparation using the appropriate jig to prepare the box.  The patella was then measured, and cut with the saw.    The proximal tibia sized and prepared accordingly with the reamer and the punch, and then all components were trialed with the trial insert.  The knee was found to have excellent balance and full motion.    The above named components were then cemented into place and all excess cement was removed.  The trial polyethylene component was in place during cementation, and then was exchanged for the real polyethylene component.    The knee was easily taken through a range of motion and the patella tracked well and the knee irrigated copiously and the parapatellar and subcutaneous tissue closed with vicryl, and monocryl with steri strips for the skin.  The arthrotomy was closed at 90 of flexion. The wounds were dressed with sterile gauze and the tourniquet released and the patient was awakened and returned to the PACU in  stable and satisfactory condition.  There were no complications.  Total tourniquet time was 75 minutes.

## 2022-02-17 NOTE — Anesthesia Procedure Notes (Addendum)
  Anesthesia Regional Block: Adductor canal block   Pre-Anesthetic Checklist: , timeout performed,  Correct Patient, Correct Site, Correct Laterality,  Correct Procedure, Correct Position, site marked,  Risks and benefits discussed,  Surgical consent,  Pre-op evaluation,  At surgeon's request and post-op pain management  Laterality: Left and Lower  Prep: chloraprep       Needles:  Injection technique: Single-shot  Needle Type: Echogenic Stimulator Needle     Needle Length: 10cm  Needle Gauge: 21     Additional Needles:   Procedures:,,,, ultrasound used (permanent image in chart),,    Narrative:  Start time: 02/17/2022 10:50 AM End time: 02/17/2022 10:55 AM Injection made incrementally with aspirations every 5 mL.  Performed by: Personally  Anesthesiologist: Josephine Igo, MD  Additional Notes: Timeout performed. Patient sedated. Relevant anatomy ID'd using [place]. Incremental 2-5m injection of LA with frequent aspiration. Patient tolerated procedure well.

## 2022-02-17 NOTE — Evaluation (Signed)
Physical Therapy Evaluation Patient Details Name: [name] MRN: [id number] DOB: 28-May-1930 Today's Date: 02/17/2022  History of Present Illness  Patient is a 88 y.o. female s/p L-TKA on 02/17/22 with PMH significant for seizures, HTN, GERD, breast cancer (2013), OA, polymyalgia rheumatica, R-TKA 2021, afib, HLD, RA,   Clinical Impression  [name] is a 88 y.o. female POD 0 s/p L-TKA. Patient reports modified independence using SPC for community mobility and rollator for home distances at baseline. Patient is now limited by functional impairments (see PT problem list below) and requires min assist for bed mobility and mod assist for transfers, further mobility limited by L knee buckling (see transfers section below).  Patient instructed in exercise to facilitate ROM and circulation to manage edema. Provided incentive spirometer and with Vcs pt able to achieve 1039m. Patient will benefit from continued skilled PT interventions to address impairments and progress towards PLOF. Acute PT will follow to progress mobility and HEP in preparation for safe discharge home.       Recommendations for follow up therapy are one component of a multi-disciplinary discharge planning process, led by the attending physician.  Recommendations may be updated based on patient status, additional functional criteria and insurance authorization.  Follow Up Recommendations Follow physician's recommendations for discharge plan and follow up therapies      Assistance Recommended at Discharge Frequent or constant Supervision/Assistance  Patient can return home with the following  A little help with walking and/or transfers;A little help with bathing/dressing/bathroom;Assistance with cooking/housework;Assist for transportation;Help with stairs or ramp for entrance    Equipment Recommendations None recommended by PT  Recommendations for Other Services       Functional Status Assessment Patient has had a  recent decline in their functional status and demonstrates the ability to make significant improvements in function in a reasonable and predictable amount of time.     Precautions / Restrictions Precautions Precautions: Fall Restrictions Weight Bearing Restrictions: No LLE Weight Bearing: Weight bearing as tolerated      Mobility  Bed Mobility Overal bed mobility: Needs Assistance Bed Mobility: Supine to Sit, Sit to Supine     Supine to sit: Min assist Sit to supine: Min assist   General bed mobility comments: Supine to sit: min assist to elevate trunk and bring LLE off bed. Sit to supine: min assist to bring BLE onto bed, pt able to scoot up in supine via BUE and RLE bridging.    Transfers Overall transfer level: Needs assistance Equipment used: Rolling walker (2 wheels) Transfers: Sit to/from Stand Sit to Stand: Mod assist, Min assist, +2 physical assistance, +2 safety/equipment           General transfer comment: Pt completed sit to stand transfer with min assist for lift assist, upon standing pt began demonstration L knee buckling, directed pt to sit to rest with mod assist for controlled lower. Pt requested to attempt again, PT providing min guard of L knee, pt's L knee buckling requiring min assist to prevent, Mod assist to lower back to bed surface. Pt reporting mild dizziness, BP 171/60 HR 66. Returned to supine positioning, further mobility deferred    Ambulation/Gait               General Gait Details: unsafe at present  Stairs            Wheelchair Mobility    Modified Rankin (Stroke Patients Only)       Balance Overall balance assessment: Needs assistance Sitting-balance  support: Feet supported, No upper extremity supported Sitting balance-Leahy Scale: Good     Standing balance support: Reliant on assistive device for balance, During functional activity, Bilateral upper extremity supported Standing balance-Leahy Scale: Poor                                Pertinent Vitals/Pain Pain Assessment Pain Assessment: 0-10 Pain Score: 7  Pain Location: Left knee Pain Descriptors / Indicators: Operative site guarding Pain Intervention(s): Limited activity within patient's tolerance, Monitored during session, Repositioned, Patient requesting pain meds-RN notified, Ice applied    Home Living Family/patient expects to be discharged to:: Private residence Living Arrangements: Alone Available Help at Discharge: Family;Available 24 hours/day (Daughter retired Therapist, sports) Type of Home: House Home Access: [place]: One [place]: Conservation officer, nature (2 wheels);Rollator (4 wheels);Cane - single point;BSC/3in1;Shower seat;Transport chair      Prior Function Prior Level of Function : Independent/Modified Independent             Mobility Comments: SPC for community mobility, rollator at home ADLs Comments: IND     Hand Dominance   Dominant Hand: Right    Extremity/Trunk Assessment   Upper Extremity Assessment Upper Extremity Assessment: Overall WFL for tasks assessed    Lower Extremity Assessment Lower Extremity Assessment: LLE deficits/detail;RLE deficits/detail RLE Deficits / Details: MMT ank DF/PF 4/5 RLE Sensation: WNL LLE Deficits / Details: MMT ank DF/PF 4/5, no extensor lag noted LLE Sensation: WNL    Cervical / Trunk Assessment Cervical / Trunk Assessment: Kyphotic  Communication   Communication: No difficulties  Cognition Arousal/Alertness: Awake/alert Behavior During Therapy: WFL for tasks assessed/performed Overall Cognitive Status: Within Functional Limits for tasks assessed                                          General Comments      Exercises Total Joint Exercises Ankle Circles/Pumps: AROM, 20 reps, Supine, Both   Assessment/Plan    PT Assessment Patient needs continued PT services  PT Problem List Decreased strength;Decreased range  of motion;Decreased activity tolerance;Decreased balance;Decreased mobility;Decreased coordination;Pain       PT Treatment Interventions DME instruction;Gait training;Stair training;Functional mobility training;Therapeutic activities;Therapeutic exercise;Balance training;Neuromuscular re-education;Patient/family education    PT Goals (Current goals can be found in the Care Plan section)  Acute Rehab PT Goals Patient Stated Goal: Walking better PT Goal Formulation: With patient Time For Goal Achievement: 02/24/22 Potential to Achieve Goals: Good    Frequency 7X/week     Co-evaluation               AM-PAC PT "6 Clicks" Mobility  Outcome Measure Help needed turning from your back to your side while in a flat bed without using bedrails?: A Little Help needed moving from lying on your back to sitting on the side of a flat bed without using bedrails?: A Little Help needed moving to and from a bed to a chair (including a wheelchair)?: A Little Help needed standing up from a chair using your arms (e.g., wheelchair or bedside chair)?: A Lot Help needed to walk in hospital room?: A Little Help needed climbing 3-5 steps with a railing? : A Lot 6 Click Score: 16    End of Session Equipment Utilized During Treatment: Gait belt Activity Tolerance: Other (comment) (  L knee buckling, dizziness) Patient left: in bed;with call bell/phone within reach;with bed alarm set Nurse Communication: Mobility status PT Visit Diagnosis: Pain;Difficulty in walking, not elsewhere classified (R26.2) Pain - Right/Left: Left Pain - part of body: Knee    Time: 6825-7493 PT Time Calculation (min) (ACUTE ONLY): 15 min   Charges:   PT Evaluation $PT Eval Low Complexity: [place], PT, DPT [facility] Office: [phone]  Coolidge Breeze 02/17/2022, 6:45 PM

## 2022-02-17 NOTE — Plan of Care (Signed)
Problem: Education: Goal: Knowledge of General Education information will improve Description: Including pain rating scale, medication(s)/side effects and non-pharmacologic comfort measures Outcome: Progressing   Problem: Clinical Measurements: Goal: Ability to maintain clinical measurements within normal limits will improve Outcome: Progressing   Problem: Activity: Goal: Risk for activity intolerance will decrease Outcome: [place], RN 02/17/22 3:12 PM

## 2022-02-18 ENCOUNTER — Encounter (HOSPITAL_COMMUNITY): Payer: Self-pay | Admitting: Specialist

## 2022-02-18 NOTE — Progress Notes (Signed)
Physical Therapy Treatment Patient Details Name: [name] MRN: [id number] DOB: [birth year] Today's Date: 02/18/2022   History of Present Illness Patient is a 88 y.o. female s/p L-TKA on 02/17/22 with PMH significant for seizures, HTN, GERD, breast cancer (2013), OA, polymyalgia rheumatica, R-TKA 2021, afib, HLD, RA,    PT Comments    Pt continues very cooperative but limited this pm by increased pain with WB despite premed.  Pt ambulated limited distance in room and assisted back to bed.  RN aware.   Recommendations for follow up therapy are one component of a multi-disciplinary discharge planning process, led by the attending physician.  Recommendations may be updated based on patient status, additional functional criteria and insurance authorization.  Follow Up Recommendations  Follow physician's recommendations for discharge plan and follow up therapies     Assistance Recommended at Discharge Frequent or constant Supervision/Assistance  Patient can return home with the following A little help with walking and/or transfers;A little help with bathing/dressing/bathroom;Assistance with cooking/housework;Assist for transportation;Help with stairs or ramp for entrance   Equipment Recommendations  None recommended by PT    Recommendations for Other Services       Precautions / Restrictions Precautions Precautions: Fall Restrictions Weight Bearing Restrictions: No LLE Weight Bearing: Weight bearing as tolerated     Mobility  Bed Mobility Overal bed mobility: Needs Assistance Bed Mobility: Sit to Supine     Supine to sit: Min assist Sit to supine: Min assist, Mod assist   General bed mobility comments: cues for sequence and use of R LE to self assist    Transfers Overall transfer level: Needs assistance Equipment used: Rolling walker (2 wheels) Transfers: Sit to/from Stand Sit to Stand: Min assist, Mod assist           General transfer comment: cues for LE  management and use of UEs to self assist    Ambulation/Gait Ambulation/Gait assistance: Min assist, Mod assist Gait Distance (Feet): 5 Feet Assistive device: Rolling walker (2 wheels) Gait Pattern/deviations: Step-to pattern, Decreased step length - right, Decreased step length - left, Shuffle, Trunk flexed Gait velocity: decr     General Gait Details: cues for sequence, posture, position from RW and safety awareness; distance ltd by pain   Stairs             Wheelchair Mobility    Modified Rankin (Stroke Patients Only)       Balance Overall balance assessment: Needs assistance Sitting-balance support: Feet supported, No upper extremity supported Sitting balance-Leahy Scale: Good     Standing balance support: Reliant on assistive device for balance, During functional activity, Bilateral upper extremity supported Standing balance-Leahy Scale: Poor                              Cognition Arousal/Alertness: Awake/alert Behavior During Therapy: WFL for tasks assessed/performed Overall Cognitive Status: Within Functional Limits for tasks assessed                                          Exercises Total Joint Exercises Ankle Circles/Pumps: AROM, 20 reps, Supine, Both Quad Sets: AROM, Both, 10 reps, Supine Heel Slides: AAROM, Left, 15 reps, Supine Straight Leg Raises: AAROM, Left, 10 reps, Supine Goniometric ROM: L knee AAROM -5 - 45    General Comments        Pertinent  Vitals/Pain Pain Assessment Pain Assessment: 0-10 Pain Score: 8  Pain Location: Left knee with WB Pain Descriptors / Indicators: Aching, Sore Pain Intervention(s): Limited activity within patient's tolerance, Monitored during session, Premedicated before session, Ice applied    Home Living                          Prior Function            PT Goals (current goals can now be found in the care plan section) Acute Rehab PT Goals Patient Stated Goal:  Walking better PT Goal Formulation: With patient Time For Goal Achievement: 02/24/22 Potential to Achieve Goals: Good Progress towards PT goals: Not progressing toward goals - comment (pain limited)    Frequency    7X/week      PT Plan Current plan remains appropriate    Co-evaluation              AM-PAC PT "6 Clicks" Mobility   Outcome Measure  Help needed turning from your back to your side while in a flat bed without using bedrails?: A Little Help needed moving from lying on your back to sitting on the side of a flat bed without using bedrails?: A Little Help needed moving to and from a bed to a chair (including a wheelchair)?: A Little Help needed standing up from a chair using your arms (e.g., wheelchair or bedside chair)?: A Little Help needed to walk in hospital room?: Total Help needed climbing 3-5 steps with a railing? : Total 6 Click Score: 14    End of Session Equipment Utilized During Treatment: Gait belt Activity Tolerance: Patient tolerated treatment well;Patient limited by fatigue Patient left: with family/visitor present;in bed;with bed alarm set;with call bell/phone within reach Nurse Communication: Mobility status PT Visit Diagnosis: Pain;Difficulty in walking, not elsewhere classified (R26.2) Pain - Right/Left: Left Pain - part of body: Knee     Time: 5974-1638 PT Time Calculation (min) (ACUTE ONLY): 17 min  Charges:  $Gait Training: 8-22 mins $Therapeutic Exercise: 8-22 mins                     [place] Pager [phone] Office [phone]    Clyde Zarrella 02/18/2022, 1:54 PM

## 2022-02-18 NOTE — Progress Notes (Signed)
Subjective: 1 Day Post-Op Procedure(s) (LRB): TOTAL KNEE ARTHROPLASTY (Left) Patient reports pain as mild.  Patient had to take oxy last night to help with pain control Had some difficulty with PT yesterday, LLE wanted to buckle when she put weight on leg + for void, + for flatus  Objective: Vital signs in last 24 hours: Temp:  [97.7 F (36.5 C)-98.2 F (36.8 C)] 97.8 F (36.6 C) (09/14 0604) Pulse Rate:  [56-104] 56 (09/14 0604) Resp:  [14-25] 17 (09/14 0604) BP: (121-177)/(50-93) 149/65 (09/14 0604) SpO2:  [88 %-100 %] 100 % (09/14 0604) Weight:  [65.8 kg] 65.8 kg (09/13 1430)  Intake/Output from previous day: 09/13 0701 - 09/14 0700 In: 2560 [P.O.:360; I.V.:1800; IV Piggyback:400] Out: 725 [Urine:700; Blood:25] Intake/Output this shift: Total I/O In: 1110 [P.O.:360; I.V.:600; IV Piggyback:150] Out: -   Recent Labs    02/17/22 1510  HGB 13.6   Recent Labs    02/17/22 1510  WBC 13.5*  RBC 4.36  HCT 42.8  PLT 173   Recent Labs    02/17/22 1510  CREATININE 0.60   No results for input(s): "LABPT", "INR" in the last 72 hours.  Neurologically intact Neurovascular intact Sensation intact distally Intact pulses distally Dorsiflexion/Plantar flexion intact Incision: dressing C/D/I Compartment soft   Assessment/Plan: 1 Day Post-Op Procedure(s) (LRB): TOTAL KNEE ARTHROPLASTY (Left) Advance diet Up with therapy, Most likely will be spending the night tonight due to slow progression with PT Plan for discharge tomorrow Continue meds for pain DVT ppx: Lovenox while in house, ASA 81 mg BID when d/c home   Patient's anticipated LOS is less than 2 midnights, meeting these requirements: - Younger than 13 - Lives within 1 hour of care - Has a competent adult at home to recover with post-op recover - NO history of  - Chronic pain requiring opiods  - Diabetes  - Coronary Artery Disease  - Heart failure  - Heart attack  - Stroke  - DVT/VTE  - Cardiac  arrhythmia  - Respiratory Failure/COPD  - Renal failure  - Anemia  - Advanced Liver disease     Drue Novel, PA-C Dr. [name], Rosanne Gutting [phone] 02/18/2022, 6:37 AM

## 2022-02-18 NOTE — Progress Notes (Signed)
Physical Therapy Treatment Patient Details Name: [name] MRN: [id number] DOB: March 07, 1930 Today's Date: 02/18/2022   History of Present Illness Patient is a 88 y.o. female s/p L-TKA on 02/17/22 with PMH significant for seizures, HTN, GERD, breast cancer (2013), OA, polymyalgia rheumatica, R-TKA 2021, afib, HLD, RA,    PT Comments    Pt very cooperative and progressing to ambulate short distance in hall but fatigues easily.   Recommendations for follow up therapy are one component of a multi-disciplinary discharge planning process, led by the attending physician.  Recommendations may be updated based on patient status, additional functional criteria and insurance authorization.  Follow Up Recommendations  Follow physician's recommendations for discharge plan and follow up therapies     Assistance Recommended at Discharge Frequent or constant Supervision/Assistance  Patient can return home with the following A little help with walking and/or transfers;A little help with bathing/dressing/bathroom;Assistance with cooking/housework;Assist for transportation;Help with stairs or ramp for entrance   Equipment Recommendations  None recommended by PT    Recommendations for Other Services       Precautions / Restrictions Precautions Precautions: Fall Restrictions Weight Bearing Restrictions: No LLE Weight Bearing: Weight bearing as tolerated     Mobility  Bed Mobility Overal bed mobility: Needs Assistance Bed Mobility: Supine to Sit     Supine to sit: Min assist     General bed mobility comments: cues for sequence and use of R LE to self assist    Transfers Overall transfer level: Needs assistance Equipment used: Rolling walker (2 wheels) Transfers: Sit to/from Stand Sit to Stand: Min assist, Mod assist           General transfer comment: cues for LE management and use of UEs to self assist    Ambulation/Gait Ambulation/Gait assistance: Min assist, Mod  assist Gait Distance (Feet): 22 Feet Assistive device: Rolling walker (2 wheels) Gait Pattern/deviations: Step-to pattern, Decreased step length - right, Decreased step length - left, Shuffle, Trunk flexed Gait velocity: decr     General Gait Details: cues for sequence, posture, position from RW and safety awareness   Stairs             Wheelchair Mobility    Modified Rankin (Stroke Patients Only)       Balance Overall balance assessment: Needs assistance Sitting-balance support: Feet supported, No upper extremity supported Sitting balance-Leahy Scale: Good     Standing balance support: Reliant on assistive device for balance, During functional activity, Bilateral upper extremity supported Standing balance-Leahy Scale: Poor                              Cognition Arousal/Alertness: Awake/alert Behavior During Therapy: WFL for tasks assessed/performed Overall Cognitive Status: Within Functional Limits for tasks assessed                                          Exercises Total Joint Exercises Ankle Circles/Pumps: AROM, 20 reps, Supine, Both Quad Sets: AROM, Both, 10 reps, Supine Heel Slides: AAROM, Left, 15 reps, Supine Straight Leg Raises: AAROM, Left, 10 reps, Supine Goniometric ROM: L knee AAROM -5 - 45    General Comments        Pertinent Vitals/Pain Pain Assessment Pain Assessment: 0-10 Pain Score: 5  Pain Location: Left knee Pain Descriptors / Indicators: Aching, Sore Pain Intervention(s): Limited activity within  patient's tolerance, Monitored during session, Premedicated before session, Ice applied    Home Living                          Prior Function            PT Goals (current goals can now be found in the care plan section) Acute Rehab PT Goals Patient Stated Goal: Walking better PT Goal Formulation: With patient Time For Goal Achievement: 02/24/22 Potential to Achieve Goals: Good Progress  towards PT goals: Progressing toward goals    Frequency    7X/week      PT Plan Current plan remains appropriate    Co-evaluation              AM-PAC PT "6 Clicks" Mobility   Outcome Measure  Help needed turning from your back to your side while in a flat bed without using bedrails?: A Little Help needed moving from lying on your back to sitting on the side of a flat bed without using bedrails?: A Little Help needed moving to and from a bed to a chair (including a wheelchair)?: A Little Help needed standing up from a chair using your arms (e.g., wheelchair or bedside chair)?: A Little Help needed to walk in hospital room?: A Little Help needed climbing 3-5 steps with a railing? : A Lot 6 Click Score: 17    End of Session Equipment Utilized During Treatment: Gait belt Activity Tolerance: Patient tolerated treatment well;Patient limited by fatigue Patient left: in chair;with call bell/phone within reach;with family/visitor present Nurse Communication: Mobility status PT Visit Diagnosis: Pain;Difficulty in walking, not elsewhere classified (R26.2) Pain - Right/Left: Left Pain - part of body: Knee     Time: 1040-1120 PT Time Calculation (min) (ACUTE ONLY): 40 min  Charges:  $Gait Training: 8-22 mins $Therapeutic Exercise: 8-22 mins                     [place] Pager [phone] Office [phone]    Ritter Helsley 02/18/2022, 11:43 AM

## 2022-02-19 MED ORDER — METHOCARBAMOL 500 MG PO TABS: 500.0000 mg | ORAL_TABLET | Freq: Four times a day (QID) | ORAL | 0 refills | Status: AC | PRN

## 2022-02-19 MED ORDER — BISACODYL 5 MG PO TBEC: 5.0000 mg | DELAYED_RELEASE_TABLET | Freq: Every day | ORAL | 0 refills | Status: AC | PRN

## 2022-02-19 MED ORDER — OXYCODONE HCL 5 MG PO TABS: 5.0000 mg | ORAL_TABLET | ORAL | 0 refills | Status: DC | PRN

## 2022-02-19 MED ORDER — SODIUM CHLORIDE 0.9 % IV BOLUS
250.0000 mL | Freq: Once | INTRAVENOUS | Status: AC
  Administered 2022-02-19: 250 mL via INTRAVENOUS

## 2022-02-19 MED ORDER — ONDANSETRON HCL 4 MG PO TABS: 4.0000 mg | ORAL_TABLET | Freq: Four times a day (QID) | ORAL | 0 refills | Status: AC | PRN

## 2022-02-19 MED ORDER — TRAMADOL HCL 50 MG PO TABS: 25.0000 mg | ORAL_TABLET | Freq: Four times a day (QID) | ORAL | Status: DC | PRN

## 2022-02-19 MED ORDER — TRAMADOL HCL 50 MG PO TABS: 50.0000 mg | ORAL_TABLET | Freq: Four times a day (QID) | ORAL | 0 refills | Status: DC

## 2022-02-19 NOTE — Discharge Summary (Incomplete)
Orthopedic Discharge Summary      Patient ID: [name] MRN: [id number] DOB/AGE: 1930/01/03 88 y.o.  Admit date: 02/17/2022 Discharge date: 03/03/2022  Admission Diagnoses: Principal Problem:   Osteoarthritis of left knee   Discharge Diagnoses:  Principal Problem:   Osteoarthritis of left knee   Discharged Condition: good  Hospital Course: Patient was admitted on September 13 for a left total knee arthroplasty due to end-stage left knee osteoarthritis.  She tolerated surgery very well.  She was sent to PACU in stable condition.  She was sent to the postop floor in stable condition.  No events overnight.  Postop day 1 patient doing well, physical therapy was limited due to pain was able to get up and ambulate just a few steps in the hallway.  Postop day 2 patient doing well, no events overnight.  Was able to get up with physical therapy today but was limited due to pain and needing more time between exercises. Post op day 3 reports of increasing confusion at this time from patients daughters. Physical therapy is becoming more difficult for patient. Unable to ambulate and needing more help. Lab work done and showed severe hyponatremia. Internal medicine was consulted along with nephrology. Patient transferred to ICU SD unit. Post op day 4 patient continues to be confused. IM, nephrology continuing to work on hyponatremia and anemia that was also found on blood work. Had 2 units of packed RBC. Patient also went into afib so cardio was consulted. Postop op day 5 patient was in afib this am. Hyponatremia is slowly resloving. BP is continuing to drop. No PT at this time due to patient weakness. Postop day 6 patient is improving. Mental status is improving. Continued hypotension. Concerns for HCAP patient started on antibiotics. Also concerns for PE so placed on Heparin. KI on LLE due to anticoagulation. Limited ROM. B/L LE dopplers order to r/o DVT. Postop day 7 and 8 Patient has been  improving. Hyponatremia is resolving. BP is regulating. PT is recommending SNF placement. Afib has resolved. Postop day 9 and 10 patient doing well, continue getting stabilized from medical standpoint.  Slowly working physical therapy.  Waiting for therapeutic level of heparin and warfarin.  Patient is having some difficulty with bladder emptying, had to be in and out cath over the past few days.  Patient is continuing to improve over the next few days.  Just waiting for SNF placement as well as therapeutic level of INR to be reached.  Once INR level had been reached she is okay for discharge to a SNF facility.Postop day 14 INR level reached 2.6. New bandage placed over incision site. [facility]/c to [facility] for [facility] placement.  Follow up in the orthopedic office on Friday. Will need to follow up with cardiologist and PCP once discharged.     Consults: cardiology, nephrology, and Internal medicine  Significant Diagnostic Studies: CMP, BL LE dopplers, echocardiogram   Treatments: IV hydration, antibiotics: Ancef, analgesia: acetaminophen and acetaminophen w/ codeine, anticoagulation: Lovenox, therapies: PT, and surgery: Left TKA  Discharge Exam: Blood pressure (!) 146/65, pulse 95, temperature 97.6 F (36.4 C), temperature source Oral, resp. rate (!) 23, height '5\' 1"'$  (1.549 m), weight 72.1 kg, SpO2 95 %. General appearance: alert, cooperative, appears stated age, and no distress Extremities: Limited ROM of LLE due to TKA, good dorsiflexion and plantar flexion of left ankle,  atraumatic, no cyanosis or edema and Homans sign is negative, no sign of DVT Pulses: 2+ and symmetric Skin: Skin color,  texture, turgor normal. No rashes or lesions Neurologic: Alert and oriented X 3 Incision/Wound: Minimal drainage  Disposition: Discharge disposition: 01-Home or Self Care       Discharge Instructions     Call MD / Call 911   Complete by: As directed    If you experience chest pain or shortness of  breath, CALL 911 and be transported to the hospital emergency room.  If you develope a fever above 101 F, pus (white drainage) or increased drainage or redness at the wound, or calf pain, call your surgeon's office.   Call MD / Call 911   Complete by: As directed    If you experience chest pain or shortness of breath, CALL 911 and be transported to the hospital emergency room.  If you develope a fever above 101 F, pus (white drainage) or increased drainage or redness at the wound, or calf pain, call your surgeon's office.   Constipation Prevention   Complete by: As directed    Drink plenty of fluids.  Prune juice may be helpful.  You may use a stool softener, such as Colace (over the counter) 100 mg twice a day.  Use MiraLax (over the counter) for constipation as needed.   Constipation Prevention   Complete by: As directed    Drink plenty of fluids.  Prune juice may be helpful.  You may use a stool softener, such as Colace (over the counter) 100 mg twice a day.  Use MiraLax (over the counter) for constipation as needed.   Diet - low sodium heart healthy   Complete by: As directed    Diet - low sodium heart healthy   Complete by: As directed    Discharge instructions   Complete by: As directed    Dr. Sydnee Cabal [facility] [address] [place], [place] [address] [phone]  TOTAL KNEE REPLACEMENT POSTOPERATIVE DIRECTIONS  Knee Rehabilitation, Guidelines Following Surgery  Results after knee surgery are often greatly improved when you follow the exercise, range of motion and muscle strengthening exercises prescribed by your doctor. Safety measures are also important to protect the knee from further injury. Any time any of these exercises cause you to have increased pain or swelling in your knee joint, decrease the amount until you are comfortable again and slowly increase them. If you have problems or questions, call your caregiver or physical therapist for advice.    HOME CARE INSTRUCTIONS  Remove items at home which could result in a fall. This includes throw rugs or furniture in walking pathways.  ICE to the affected knee every three hours for 30 minutes at a time and then as needed for pain and swelling.  Continue to use ice on the knee for pain and swelling from surgery. You may notice swelling that will progress down to the foot and ankle.  This is normal after surgery.  Elevate the leg when you are not up walking on it.   Continue to use the breathing machine which will help keep your temperature down.  It is common for your temperature to cycle up and down following surgery, especially at night when you are not up moving around and exerting yourself.  The breathing machine keeps your lungs expanded and your temperature down. Do not place pillow under knee, focus on keeping the knee straight while resting   DIET You may resume your previous home diet once your are discharged from the hospital.  DRESSING / WOUND CARE / SHOWERING Keep the  surgical dressing until follow up.  The dressing is water proof, but you need to put extra covering over it like plastic wrap.  IF THE DRESSING FALLS OFF or the wound gets wet inside, change the dressing with sterile gauze.  Please use good hand washing techniques before changing the dressing.  Do not use any lotions or creams on the incision until instructed by your surgeon.   You may start showering once you are discharged home but do not submerge the incision under water.  You are sent home with an ACE bandage on over the leg, this can be removed at 3 days from surgery. At this time you can start showering. Please place the white TED stocking on the surgical leg after. This needs to be worn on the surgical leg for 2 weeks after surgery.   ACTIVITY Walk with your walker as instructed. Use walker as long as suggested by your caregivers. Avoid periods of inactivity such as sitting longer than an hour when not asleep.  This helps prevent blood clots.  You may resume a sexual relationship in one month or when given the OK by your doctor.  You may return to work once you are cleared by your doctor.  Do not drive a car for 6 weeks or until released by you surgeon.  Do not drive while taking narcotics.  WEIGHT BEARING Weight bearing as tolerated with assist device (walker, cane, etc) as directed, use it as long as suggested by your surgeon or therapist, typically at least 4-6 weeks.  POSTOPERATIVE CONSTIPATION PROTOCOL Constipation - defined medically as fewer than three stools per week and severe constipation as less than one stool per week.  One of the most common issues patients have following surgery is constipation.  Even if you have a regular bowel pattern at home, your normal regimen is likely to be disrupted due to multiple reasons following surgery.  Combination of anesthesia, postoperative narcotics, change in appetite and fluid intake all can affect your bowels.  In order to avoid complications following surgery, here are some recommendations in order to help you during your recovery period.  Colace (docusate) - Pick up an over-the-counter form of Colace or another stool softener and take twice a day as long as you are requiring postoperative pain medications.  Take with a full glass of water daily.  If you experience loose stools or diarrhea, hold the colace until you stool forms back up.  If your symptoms do not get better within 1 week or if they get worse, check with your doctor.  Dulcolax (bisacodyl) - Pick up over-the-counter and take as directed by the product packaging as needed to assist with the movement of your bowels.  Take with a full glass of water.  Use this product as needed if not relieved by Colace only.   MiraLax (polyethylene glycol) - Pick up over-the-counter to have on hand.  MiraLax is a solution that will increase the amount of water in your bowels to assist with bowel movements.   Take as directed and can mix with a glass of water, juice, soda, coffee, or tea.  Take if you go more than two days without a movement. Do not use MiraLax more than once per day. Call your doctor if you are still constipated or irregular after using this medication for 7 days in a row.  If you continue to have problems with postoperative constipation, please contact the office for further assistance and recommendations.  If you experience "the  worst abdominal pain ever" or develop nausea or vomiting, please contact the office immediatly for further recommendations for treatment.  ITCHING  If you experience itching with your medications, try taking only a single pain pill, or even half a pain pill at a time.  You can also use Benadryl over the counter for itching or also to help with sleep.   TED HOSE STOCKINGS Wear the elastic stockings on both legs for two weeks following surgery during the day but you may remove then at night for sleeping.  Okay to remove ACE in 3 days, put TED on after  MEDICATIONS See your medication summary on the "After Visit Summary" that the nursing staff will review with you prior to discharge.  You may have some home medications which will be placed on hold until you complete the course of blood thinner medication.  It is important for you to complete the blood thinner medication as prescribed by your surgeon.  Continue your approved medications as instructed at time of discharge. If you were not previously taking any blood thinners prior to surgery please start taking Aspirin 325 mg tabs twice daily for 6 weeks. If you are unable to take Aspirin please let your doctor know.   PRECAUTIONS If you experience chest pain or shortness of breath - call 911 immediately for transfer to the hospital emergency department.  If you develop a fever greater that 101 F, purulent drainage from wound, increased redness or drainage from wound, foul odor from the wound/dressing, or calf  pain - CONTACT YOUR SURGEON.                                                   FOLLOW-UP APPOINTMENTS Make sure you keep all of your appointments after your operation with your surgeon and caregivers. You should call the office at the above phone number and make an appointment for approximately two weeks after the date of your surgery or on the date instructed by your surgeon outlined in the "After Visit Summary".   RANGE OF MOTION AND STRENGTHENING EXERCISES  Rehabilitation of the knee is important following a knee injury or an operation. After just a few days of immobilization, the muscles of the thigh which control the knee become weakened and shrink (atrophy). Knee exercises are designed to build up the tone and strength of the thigh muscles and to improve knee motion. Often times heat used for twenty to thirty minutes before working out will loosen up your tissues and help with improving the range of motion but do not use heat for the first two weeks following surgery. These exercises can be done on a training (exercise) mat, on the floor, on a table or on a bed. Use what ever works the best and is most comfortable for you Knee exercises include:  Leg Lifts - While your knee is still immobilized in a splint or cast, you can do straight leg raises. Lift the leg to 60 degrees, hold for 3 sec, and slowly lower the leg. Repeat 10-20 times 2-3 times daily. Perform this exercise against resistance later as your knee gets better.  Quad and Hamstring Sets - Tighten up the muscle on the front of the thigh (Quad) and hold for 5-10 sec. Repeat this 10-20 times hourly. Hamstring sets are done by pushing the foot backward against an object  and holding for 5-10 sec. Repeat as with quad sets.  Leg Slides: Lying on your back, slowly slide your foot toward your buttocks, bending your knee up off the floor (only go as far as is comfortable). Then slowly slide your foot back down until your leg is flat on the floor  again. Angel Wings: Lying on your back spread your legs to the side as far apart as you can without causing discomfort.  A rehabilitation program following serious knee injuries can speed recovery and prevent re-injury in the future due to weakened muscles. Contact your doctor or a physical therapist for more information on knee rehabilitation.   IF YOU ARE TRANSFERRED TO A SKILLED REHAB FACILITY If the patient is transferred to a skilled rehab facility following release from the hospital, a list of the current medications will be sent to the facility for the patient to continue.  When discharged from the skilled rehab facility, please have the facility set up the patient's [place] prior to being released. Also, the skilled facility will be responsible for providing the patient with their medications at time of release from the facility to include their pain medication, the muscle relaxants, and their blood thinner medication. If the patient is still at the rehab facility at time of the two week follow up appointment, the skilled rehab facility will also need to assist the patient in arranging follow up appointment in our office and any transportation needs.  MAKE SURE YOU:  Understand these instructions.  Get help right away if you are not doing well or get worse.    Pick up stool softner and laxative for home use following surgery while on pain medications. May shower starting three days after surgery. Please use a clean towel to pat the leg dry following showers. Continue to use ice for pain and swelling after surgery. Do not use any lotions or creams on the incision until instructed by you Start Aspirin immediately following surgery.   Discharge instructions   Complete by: As directed    Dr. Sydnee Cabal [facility] [address] [place], [place] [address] [phone]  TOTAL KNEE REPLACEMENT POSTOPERATIVE DIRECTIONS  Knee Rehabilitation,  Guidelines Following Surgery  Results after knee surgery are often greatly improved when you follow the exercise, range of motion and muscle strengthening exercises prescribed by your doctor. Safety measures are also important to protect the knee from further injury. Any time any of these exercises cause you to have increased pain or swelling in your knee joint, decrease the amount until you are comfortable again and slowly increase them. If you have problems or questions, call your caregiver or physical therapist for advice.   HOME CARE INSTRUCTIONS  Remove items at home which could result in a fall. This includes throw rugs or furniture in walking pathways.  ICE to the affected knee every three hours for 30 minutes at a time and then as needed for pain and swelling.  Continue to use ice on the knee for pain and swelling from surgery. You may notice swelling that will progress down to the foot and ankle.  This is normal after surgery.  Elevate the leg when you are not up walking on it.   Continue to use the breathing machine which will help keep your temperature down.  It is common for your temperature to cycle up and down following surgery, especially at night when you are not up moving around and exerting yourself.  The breathing  machine keeps your lungs expanded and your temperature down. Do not place pillow under knee, focus on keeping the knee straight while resting   DIET You may resume your previous home diet once your are discharged from the hospital.  DRESSING / WOUND CARE / SHOWERING Keep the surgical dressing until follow up.  The dressing is water proof, but you need to put extra covering over it like plastic wrap.  IF THE DRESSING FALLS OFF or the wound gets wet inside, change the dressing with sterile gauze.  Please use good hand washing techniques before changing the dressing.  Do not use any lotions or creams on the incision until instructed by your surgeon.   You may start showering  once you are discharged home but do not submerge the incision under water.  You are sent home with an ACE bandage on over the leg, this can be removed at 3 days from surgery. At this time you can start showering. Please place the white TED stocking on the surgical leg after. This needs to be worn on the surgical leg for 2 weeks after surgery.   ACTIVITY Walk with your walker as instructed. Use walker as long as suggested by your caregivers. Avoid periods of inactivity such as sitting longer than an hour when not asleep. This helps prevent blood clots.  You may resume a sexual relationship in one month or when given the OK by your doctor.  You may return to work once you are cleared by your doctor.  Do not drive a car for 6 weeks or until released by you surgeon.  Do not drive while taking narcotics.  WEIGHT BEARING Weight bearing as tolerated with assist device (walker, cane, etc) as directed, use it as long as suggested by your surgeon or therapist, typically at least 4-6 weeks.  POSTOPERATIVE CONSTIPATION PROTOCOL Constipation - defined medically as fewer than three stools per week and severe constipation as less than one stool per week.  One of the most common issues patients have following surgery is constipation.  Even if you have a regular bowel pattern at home, your normal regimen is likely to be disrupted due to multiple reasons following surgery.  Combination of anesthesia, postoperative narcotics, change in appetite and fluid intake all can affect your bowels.  In order to avoid complications following surgery, here are some recommendations in order to help you during your recovery period.  Colace (docusate) - Pick up an over-the-counter form of Colace or another stool softener and take twice a day as long as you are requiring postoperative pain medications.  Take with a full glass of water daily.  If you experience loose stools or diarrhea, hold the colace until you stool forms back  up.  If your symptoms do not get better within 1 week or if they get worse, check with your doctor.  Dulcolax (bisacodyl) - Pick up over-the-counter and take as directed by the product packaging as needed to assist with the movement of your bowels.  Take with a full glass of water.  Use this product as needed if not relieved by Colace only.   MiraLax (polyethylene glycol) - Pick up over-the-counter to have on hand.  MiraLax is a solution that will increase the amount of water in your bowels to assist with bowel movements.  Take as directed and can mix with a glass of water, juice, soda, coffee, or tea.  Take if you go more than two days without a movement. Do not use MiraLax  more than once per day. Call your doctor if you are still constipated or irregular after using this medication for 7 days in a row.  If you continue to have problems with postoperative constipation, please contact the office for further assistance and recommendations.  If you experience "the worst abdominal pain ever" or develop nausea or vomiting, please contact the office immediatly for further recommendations for treatment.  ITCHING  If you experience itching with your medications, try taking only a single pain pill, or even half a pain pill at a time.  You can also use Benadryl over the counter for itching or also to help with sleep.   TED HOSE STOCKINGS Wear the elastic stockings on both legs for two weeks following surgery during the day but you may remove then at night for sleeping.  Okay to remove ACE in 3 days, put TED on after  MEDICATIONS See your medication summary on the "After Visit Summary" that the nursing staff will review with you prior to discharge.  You may have some home medications which will be placed on hold until you complete the course of blood thinner medication.  It is important for you to complete the blood thinner medication as prescribed by your surgeon.  Continue your approved medications as  instructed at time of discharge. If you were not previously taking any blood thinners prior to surgery please start taking Aspirin 325 mg tabs twice daily for 6 weeks. If you are unable to take Aspirin please let your doctor know.   PRECAUTIONS If you experience chest pain or shortness of breath - call 911 immediately for transfer to the hospital emergency department.  If you develop a fever greater that 101 F, purulent drainage from wound, increased redness or drainage from wound, foul odor from the wound/dressing, or calf pain - CONTACT YOUR SURGEON.                                                   FOLLOW-UP APPOINTMENTS Make sure you keep all of your appointments after your operation with your surgeon and caregivers. You should call the office at the above phone number and make an appointment for approximately two weeks after the date of your surgery or on the date instructed by your surgeon outlined in the "After Visit Summary".   RANGE OF MOTION AND STRENGTHENING EXERCISES  Rehabilitation of the knee is important following a knee injury or an operation. After just a few days of immobilization, the muscles of the thigh which control the knee become weakened and shrink (atrophy). Knee exercises are designed to build up the tone and strength of the thigh muscles and to improve knee motion. Often times heat used for twenty to thirty minutes before working out will loosen up your tissues and help with improving the range of motion but do not use heat for the first two weeks following surgery. These exercises can be done on a training (exercise) mat, on the floor, on a table or on a bed. Use what ever works the best and is most comfortable for you Knee exercises include:  Leg Lifts - While your knee is still immobilized in a splint or cast, you can do straight leg raises. Lift the leg to 60 degrees, hold for 3 sec, and slowly lower the leg. Repeat 10-20 times 2-3 times daily. Perform  this exercise  against resistance later as your knee gets better.  Quad and Hamstring Sets - Tighten up the muscle on the front of the thigh (Quad) and hold for 5-10 sec. Repeat this 10-20 times hourly. Hamstring sets are done by pushing the foot backward against an object and holding for 5-10 sec. Repeat as with quad sets.  Leg Slides: Lying on your back, slowly slide your foot toward your buttocks, bending your knee up off the floor (only go as far as is comfortable). Then slowly slide your foot back down until your leg is flat on the floor again. Angel Wings: Lying on your back spread your legs to the side as far apart as you can without causing discomfort.  A rehabilitation program following serious knee injuries can speed recovery and prevent re-injury in the future due to weakened muscles. Contact your doctor or a physical therapist for more information on knee rehabilitation.   IF YOU ARE TRANSFERRED TO A SKILLED REHAB FACILITY If the patient is transferred to a skilled rehab facility following release from the hospital, a list of the current medications will be sent to the facility for the patient to continue.  When discharged from the skilled rehab facility, please have the facility set up the patient's [place] prior to being released. Also, the skilled facility will be responsible for providing the patient with their medications at time of release from the facility to include their pain medication, the muscle relaxants, and their blood thinner medication. If the patient is still at the rehab facility at time of the two week follow up appointment, the skilled rehab facility will also need to assist the patient in arranging follow up appointment in our office and any transportation needs.  MAKE SURE YOU:  Understand these instructions.  Get help right away if you are not doing well or get worse.    Pick up stool softner and laxative for home use following surgery while on pain  medications. May shower starting three days after surgery. Please use a clean towel to pat the leg dry following showers. Continue to use ice for pain and swelling after surgery. Do not use any lotions or creams on the incision until instructed by you Start Aspirin immediately following surgery.   Do not put a pillow under the knee. Place it under the heel.   Complete by: As directed    Do not put a pillow under the knee. Place it under the heel.   Complete by: As directed    Driving restrictions   Complete by: As directed    No driving for two weeks   Driving restrictions   Complete by: As directed    No driving for two weeks   Post-operative opioid taper instructions:   Complete by: As directed    POST-OPERATIVE OPIOID TAPER INSTRUCTIONS: It is important to wean off of your opioid medication as soon as possible. If you do not need pain medication after your surgery it is ok to stop day one. Opioids include: Codeine, Hydrocodone(Norco, Vicodin), Oxycodone(Percocet, oxycontin) and hydromorphone amongst others.  Long term and even short term use of opiods can cause: Increased pain response Dependence Constipation Depression Respiratory depression And more.  Withdrawal symptoms can include Flu like symptoms Nausea, vomiting And more Techniques to manage these symptoms Hydrate well Eat regular healthy meals Stay active Use relaxation techniques(deep breathing, meditating, yoga) Do Not substitute Alcohol to help with tapering If you have been on opioids for less  than two weeks and do not have pain than it is ok to stop all together.  Plan to wean off of opioids This plan should start within one week post op of your joint replacement. Maintain the same interval or time between taking each dose and first decrease the dose.  Cut the total daily intake of opioids by one tablet each day Next start to increase the time between doses. The last dose that should be eliminated is the  evening dose.      Post-operative opioid taper instructions:   Complete by: As directed    POST-OPERATIVE OPIOID TAPER INSTRUCTIONS: It is important to wean off of your opioid medication as soon as possible. If you do not need pain medication after your surgery it is ok to stop day one. Opioids include: Codeine, Hydrocodone(Norco, Vicodin), Oxycodone(Percocet, oxycontin) and hydromorphone amongst others.  Long term and even short term use of opiods can cause: Increased pain response Dependence Constipation Depression Respiratory depression And more.  Withdrawal symptoms can include Flu like symptoms Nausea, vomiting And more Techniques to manage these symptoms Hydrate well Eat regular healthy meals Stay active Use relaxation techniques(deep breathing, meditating, yoga) Do Not substitute Alcohol to help with tapering If you have been on opioids for less than two weeks and do not have pain than it is ok to stop all together.  Plan to wean off of opioids This plan should start within one week post op of your joint replacement. Maintain the same interval or time between taking each dose and first decrease the dose.  Cut the total daily intake of opioids by one tablet each day Next start to increase the time between doses. The last dose that should be eliminated is the evening dose.      TED hose   Complete by: As directed    Use stockings (TED hose) for three weeks on both leg(s).  You may remove them at night for sleeping.   TED hose   Complete by: As directed    Use stockings (TED hose) for three weeks on both leg(s).  You may remove them at night for sleeping.   Weight bearing as tolerated   Complete by: As directed    Weight bearing as tolerated   Complete by: As directed       Allergies as of 03/03/2022       Reactions   Sulfa Antibiotics Nausea Only   Penicillins Swelling   Tolerated Cephalosporin 01/04/20     Simvastatin Swelling        Medication List      STOP taking these medications    celecoxib 200 MG capsule Commonly known as: CELEBREX       TAKE these medications    acetaminophen 500 MG tablet Commonly known as: TYLENOL Take 2 tablets (1,000 mg total) by mouth every 6 (six) hours as needed.   amiodarone 200 MG tablet Commonly known as: PACERONE Take 1 tablet (200 mg total) by mouth daily.   bisacodyl 5 MG EC tablet Commonly known as: DULCOLAX Take 1 tablet (5 mg total) by mouth daily as needed for moderate constipation.   cholecalciferol 25 MCG (1000 UNIT) tablet Commonly known as: VITAMIN D3 Take 1,000 Units by mouth daily.   escitalopram 10 MG tablet Commonly known as: LEXAPRO Take 10 mg by mouth daily.   ezetimibe 10 MG tablet Commonly known as: ZETIA Take 10 mg by mouth daily.   feeding supplement Liqd Take 237 mLs by mouth 2 (two)  times daily between meals.   methocarbamol 500 MG tablet Commonly known as: ROBAXIN Take 1 tablet (500 mg total) by mouth every 6 (six) hours as needed for muscle spasms.   metoprolol tartrate 25 MG tablet Commonly known as: LOPRESSOR Take 25 mg by mouth 2 (two) times daily.   multivitamin with minerals tablet Take 1 tablet by mouth daily.   ondansetron 4 MG tablet Commonly known as: ZOFRAN Take 1 tablet (4 mg total) by mouth every 6 (six) hours as needed for nausea.   oxyCODONE 5 MG immediate release tablet Commonly known as: Oxy IR/ROXICODONE Take 1-2 tablets (5-10 mg total) by mouth every 4 (four) hours as needed for moderate pain or severe pain (pain score 4-6).   pantoprazole 40 MG tablet Commonly known as: PROTONIX Take 40 mg by mouth daily.   PHENobarbital 97.2 MG tablet Commonly known as: LUMINAL Take 97.2 mg by mouth at bedtime.   potassium chloride SA 20 MEQ tablet Commonly known as: KLOR-CON M Take 1 tablet (20 mEq total) by mouth daily.   predniSONE 5 MG tablet Commonly known as: DELTASONE Take 5 mg by mouth daily with breakfast.   tamsulosin  0.4 MG Caps capsule Commonly known as: FLOMAX Take 1 capsule (0.4 mg total) by mouth daily.   traMADol 50 MG tablet Commonly known as: ULTRAM Take 1 tablet (50 mg total) by mouth every 6 (six) hours.   warfarin 5 MG tablet Commonly known as: COUMADIN Take 1 tablet (5 mg total) by mouth daily at 4 PM.               Discharge Care Instructions  (From admission, onward)           Start     Ordered   03/03/22 0000  Weight bearing as tolerated        03/03/22 1024   02/19/22 0000  Weight bearing as tolerated        02/19/22 0713             Signed: Drue Novel Dr. Lajuana Ripple [phone] 03/03/2022, 10:31 AM

## 2022-02-19 NOTE — Progress Notes (Signed)
Physical Therapy Treatment Patient Details Name: [name] MRN: [id number] DOB: Dec 25, 1929 Today's Date: 02/19/2022   History of Present Illness Patient is a 88 y.o. female s/p L-TKA on 02/17/22 with PMH significant for seizures, HTN, GERD, breast cancer (2013), OA, polymyalgia rheumatica, R-TKA 2021, afib, HLD, RA,    PT Comments    POD # 2 am session General Comments: AxO x 3 pleasant and following all commands with increased time.  C/O fatigue.  Requires rest breaks between each activity. Max c/o fatigue.  Feeling "worn out". OOB to amb was difficult.  General bed mobility comments: required increased assist this session to transfer to EOB using bed pad to complete scooting. General transfer comment: required increased assist this session with c/o fatigue/weakness as well as HR increasing to 130's with activity. General Gait Details: very limited activity tolerance due to increased c/o weakness/fatigue and HR increasing to low 130's.  Recliner following for safety. Then returned to room to perform some TE's following HEP handout.  Instructed on proper tech, freq as well as use of ICE.   Reported to RN, concerns for dehydration as pt's urine is dark and elevated HR and MAX c/o fatigue. Will see pt again this afternoon in hopes she performs better.   Recommendations for follow up therapy are one component of a multi-disciplinary discharge planning process, led by the attending physician.  Recommendations may be updated based on patient status, additional functional criteria and insurance authorization.  Follow Up Recommendations  Follow physician's recommendations for discharge plan and follow up therapies     Assistance Recommended at Discharge Frequent or constant Supervision/Assistance  Patient can return home with the following A little help with walking and/or transfers;A little help with bathing/dressing/bathroom;Assistance with cooking/housework;Assist for transportation;Help  with stairs or ramp for entrance   Equipment Recommendations  None recommended by PT    Recommendations for Other Services       Precautions / Restrictions Precautions Precautions: Fall Precaution Comments: no pillow under knee Restrictions Weight Bearing Restrictions: No LLE Weight Bearing: Weight bearing as tolerated     Mobility  Bed Mobility Overal bed mobility: Needs Assistance Bed Mobility: Supine to Sit     Supine to sit: Mod assist, Max assist     General bed mobility comments: required increased assist this session to transfer to EOB using bed pad to complete scooting.    Transfers Overall transfer level: Needs assistance Equipment used: Rolling walker (2 wheels) Transfers: Sit to/from Stand Sit to Stand: Mod assist           General transfer comment: required increased assist this session with c/o fatigue/weakness as well as HR increasing to 130's with activity.    Ambulation/Gait Ambulation/Gait assistance: Mod assist, +2 physical assistance, +2 safety/equipment Gait Distance (Feet): 3 Feet Assistive device: Rolling walker (2 wheels) Gait Pattern/deviations: Step-to pattern, Decreased step length - right, Decreased step length - left, Shuffle, Trunk flexed Gait velocity: decr     General Gait Details: very limited activity tolerance due to increased c/o weakness/fatigue and HR increasing to low 130's.  Recliner following for safety.   Stairs             Wheelchair Mobility    Modified Rankin (Stroke Patients Only)       Balance  Cognition Arousal/Alertness: Awake/alert Behavior During Therapy: WFL for tasks assessed/performed Overall Cognitive Status: Within Functional Limits for tasks assessed                                 General Comments: AxO x 3 pleasant and following all commands with increased time.  C/O fatigue.  Requires rest breaks between each  activity.        Exercises  Total Knee Replacement TE's following HEP handout 10 reps B LE ankle pumps 05 reps towel squeezes 05 reps knee presses 05 reps heel slides  05 reps SAQ's 05 reps SLR's 05 reps ABD Educated on use of gait belt to assist with TE's Followed by ICE     General Comments        Pertinent Vitals/Pain Pain Assessment Pain Assessment: Faces Faces Pain Scale: Hurts a little bit Pain Location: left knee with activity Pain Descriptors / Indicators: Aching, Sore, Grimacing, Operative site guarding Pain Intervention(s): Monitored during session, Premedicated before session, Repositioned, Ice applied    Home Living                          Prior Function            PT Goals (current goals can now be found in the care plan section) Progress towards PT goals: Progressing toward goals    Frequency    7X/week      PT Plan Current plan remains appropriate    Co-evaluation              AM-PAC PT "6 Clicks" Mobility   Outcome Measure  Help needed turning from your back to your side while in a flat bed without using bedrails?: A Lot Help needed moving from lying on your back to sitting on the side of a flat bed without using bedrails?: A Lot Help needed moving to and from a bed to a chair (including a wheelchair)?: A Lot Help needed standing up from a chair using your arms (e.g., wheelchair or bedside chair)?: A Lot Help needed to walk in hospital room?: A Lot Help needed climbing 3-5 steps with a railing? : Total 6 Click Score: 11    End of Session Equipment Utilized During Treatment: Gait belt Activity Tolerance: Patient limited by fatigue Patient left: in chair;with call bell/phone within reach;with family/visitor present Nurse Communication: Mobility status PT Visit Diagnosis: Pain;Difficulty in walking, not elsewhere classified (R26.2) Pain - Right/Left: Left Pain - part of body: Knee     Time: 1133-1205 PT Time  Calculation (min) (ACUTE ONLY): 32 min  Charges:  $Gait Training: 8-22 mins $Therapeutic Exercise: 8-22 mins                     {Luetta Piazza  PTA Acute  Rehabilitation [employer] M-F          [phone] Weekend pager [phone]

## 2022-02-19 NOTE — Progress Notes (Signed)
Subjective: 2 Days Post-Op Procedure(s) (LRB): TOTAL KNEE ARTHROPLASTY (Left) Patient seen in rounds for Dr. [name] Patient reports pain as 3 on 0-10 scale.   Patient was able to get up and ambulate slightly yesterday with physical therapy, pain was limiting factor yesterday for Positive for void, positive for flatus  Objective: Vital signs in last 24 hours: Temp:  [98.1 F (36.7 C)-99.2 F (37.3 C)] 99.2 F (37.3 C) (09/15 0602) Pulse Rate:  [69-101] 101 (09/15 0602) Resp:  [17-18] 18 (09/15 0602) BP: (152-175)/(74-76) 152/76 (09/15 0602) SpO2:  [97 %-98 %] 97 % (09/15 0602)  Intake/Output from previous day: 09/14 0701 - 09/15 0700 In: 900 [P.O.:300; I.V.:600] Out: -  Intake/Output this shift: No intake/output data recorded.  Recent Labs    02/17/22 1510  HGB 13.6   Recent Labs    02/17/22 1510  WBC 13.5*  RBC 4.36  HCT 42.8  PLT 173   Recent Labs    02/17/22 1510  CREATININE 0.60   No results for input(s): "LABPT", "INR" in the last 72 hours.  Neurologically intact Neurovascular intact Sensation intact distally Intact pulses distally Dorsiflexion/Plantar flexion intact Incision: scant drainage Compartment soft   Assessment/Plan: 2 Days Post-Op Procedure(s) (LRB): TOTAL KNEE ARTHROPLASTY (Left) Up with therapy at this point or just seeing how she progresses with therapy.  If she slowly progresses today but needs to spend the night that is okay we can discharge her tomorrow Occasions have been sent to the pharmacy She will start aspirin 81 mg when she gets home for DVT prevention while in the house Lovenox Okay for dressing change prior to her discharge She will follow-up in the office in 2 weeks    Patient's anticipated LOS is less than 2 midnights, meeting these requirements: - Younger than 38 - Lives within 1 hour of care - Has a competent adult at home to recover with post-op recover - NO history of  - Chronic pain requiring opiods  -  Diabetes  - Coronary Artery Disease  - Heart failure  - Heart attack  - Stroke  - DVT/VTE  - Cardiac arrhythmia  - Respiratory Failure/COPD  - Renal failure  - Anemia  - Advanced Liver disease     Drue Novel, PA-C Dr. Sydnee Cabal, [facility] [phone] 02/19/2022, 7:08 AM

## 2022-02-19 NOTE — Progress Notes (Signed)
Physical Therapy Treatment Patient Details Name: [name] MRN: [id number] DOB: Apr 21, 1930 Today's Date: 02/19/2022   History of Present Illness Patient is a 88 y.o. female s/p L-TKA on 02/17/22 with PMH significant for seizures, HTN, GERD, breast cancer (2013), OA, polymyalgia rheumatica, R-TKA 2021, afib, HLD, RA,    PT Comments    POD # 2 pm session Pt NOT progressing with afternoon session due to level of sleepiness/grogginess.  Discussed with RN "Tylenol ONLY" as even 1 tab Tramadol effects pt's level of alertness.  Pt also present with poor PO Intake.  IV fluids are helping rehydrate as HR was 118 with activity vs 130 this morning session.   Assisted  on/off BSC and back to bed required + 2 MAX Assist.  Pt was unable to take any functional steps due to level of grogginess. Reported to RN, pt did NOT meet her mobility goals to safely D/C to home today.   Recommendations for follow up therapy are one component of a multi-disciplinary discharge planning process, led by the attending physician.  Recommendations may be updated based on patient status, additional functional criteria and insurance authorization.  Follow Up Recommendations  Home health PT     Assistance Recommended at Discharge Frequent or constant Supervision/Assistance  Patient can return home with the following A little help with walking and/or transfers;A little help with bathing/dressing/bathroom;Assistance with cooking/housework;Assist for transportation;Help with stairs or ramp for entrance   Equipment Recommendations  None recommended by PT    Recommendations for Other Services       Precautions / Restrictions Precautions Precautions: Fall Precaution Comments: no pillow under knee Restrictions Weight Bearing Restrictions: No LLE Weight Bearing: Weight bearing as tolerated     Mobility  Bed Mobility Overal bed mobility: Needs Assistance Bed Mobility: Sit to Supine     Supine to sit: Mod  assist, Max assist Sit to supine: Max assist   General bed mobility comments: required MAX assist back to bed then positioned to comfort.    Transfers Overall transfer level: Needs assistance Equipment used: Rolling walker (2 wheels) Transfers: Sit to/from Stand Sit to Stand: Max assist, +2 physical assistance, +2 safety/equipment           General transfer comment: required increased assist this session with c/o fatigue/weakness as well as sleepy/groggy state.  HR 118 with activity (improvement).  Assisted from recliner to [facility] then from [facility] back to bed + 2 assist.    Ambulation/Gait Ambulation/Gait assistance: Mod assist, +2 physical assistance, +2 safety/equipment Gait Distance (Feet): 3 Feet Assistive device: Rolling walker (2 wheels) Gait Pattern/deviations: Step-to pattern, Decreased step length - right, Decreased step length - left, Shuffle, Trunk flexed Gait velocity: decr     General Gait Details: pt was UNABLE to take any functional steps due to level of sleepiness/grogginess (? pain meds).   Stairs             Wheelchair Mobility    Modified Rankin (Stroke Patients Only)       Balance                                            Cognition Arousal/Alertness: Awake/alert Behavior During Therapy: WFL for tasks assessed/performed Overall Cognitive Status: Within Functional Limits for tasks assessed  General Comments: AxO x 3 VERY sleepy/groggy this session. Only received 50 mg Tramadol at 11:47 and Daughter stated "her mental status is not same".  Reported to RN "Tylenol ONLY"  due to pt's impaired level of alertness.  Pt also "not eating much" cause she is so groggy.        Exercises      General Comments        Pertinent Vitals/Pain Pain Assessment Pain Assessment: No/denies pain Faces Pain Scale: Hurts a little bit Pain Location: left knee with activity Pain Descriptors /  Indicators: Aching, Sore, Grimacing, Operative site guarding Pain Intervention(s): Monitored during session, Premedicated before session, Repositioned, Ice applied    Home Living                          Prior Function            PT Goals (current goals can now be found in the care plan section) Progress towards PT goals: Progressing toward goals    Frequency    7X/week      PT Plan Current plan remains appropriate    Co-evaluation              AM-PAC PT "6 Clicks" Mobility   Outcome Measure  Help needed turning from your back to your side while in a flat bed without using bedrails?: A Lot Help needed moving from lying on your back to sitting on the side of a flat bed without using bedrails?: A Lot Help needed moving to and from a bed to a chair (including a wheelchair)?: A Lot Help needed standing up from a chair using your arms (e.g., wheelchair or bedside chair)?: A Lot Help needed to walk in hospital room?: A Lot Help needed climbing 3-5 steps with a railing? : A Lot 6 Click Score: 12    End of Session Equipment Utilized During Treatment: Gait belt Activity Tolerance: Patient limited by fatigue Patient left: in bed;with call bell/phone within reach;with bed alarm set;with family/visitor present Nurse Communication: Mobility status PT Visit Diagnosis: Pain;Difficulty in walking, not elsewhere classified (R26.2) Pain - Right/Left: Left Pain - part of body: Knee     Time: 2703-5009 PT Time Calculation (min) (ACUTE ONLY): 28 min  Charges:  2 Ta  $Therapeutic Activity: 23-37 mins                     {India Jolin  PTA Acute  Rehabilitation Services Office M-F          [phone] Weekend pager [phone]

## 2022-02-20 ENCOUNTER — Observation Stay (HOSPITAL_COMMUNITY): Payer: Medicare Other

## 2022-02-20 DIAGNOSIS — E871 Hypo-osmolality and hyponatremia: Secondary | ICD-10-CM | POA: Diagnosis present

## 2022-02-20 DIAGNOSIS — M1712 Unilateral primary osteoarthritis, left knee: Secondary | ICD-10-CM | POA: Diagnosis not present

## 2022-02-20 DIAGNOSIS — G9341 Metabolic encephalopathy: Secondary | ICD-10-CM | POA: Diagnosis present

## 2022-02-20 LAB — CBC
HCT: 24.4 % — ABNORMAL LOW (ref 36.0–46.0)
Hemoglobin: 8.6 g/dL — ABNORMAL LOW (ref 12.0–15.0)
MCH: 32.2 pg (ref 26.0–34.0)
MCHC: 35.2 g/dL (ref 30.0–36.0)
MCV: 91.4 fL (ref 80.0–100.0)
Platelets: 198 10*3/uL (ref 150–400)
RBC: 2.67 MIL/uL — ABNORMAL LOW (ref 3.87–5.11)
RDW: 13.1 % (ref 11.5–15.5)
WBC: 28.4 10*3/uL — ABNORMAL HIGH (ref 4.0–10.5)
nRBC: 0.1 % (ref 0.0–0.2)

## 2022-02-20 LAB — URINALYSIS, ROUTINE W REFLEX MICROSCOPIC
Bacteria, UA: NONE SEEN
Bilirubin Urine: NEGATIVE
Glucose, UA: 50 mg/dL — AB
Ketones, ur: NEGATIVE mg/dL
Leukocytes,Ua: NEGATIVE
Nitrite: NEGATIVE
Protein, ur: 30 mg/dL — AB
Specific Gravity, Urine: 1.016 (ref 1.005–1.030)
pH: 6 (ref 5.0–8.0)

## 2022-02-20 LAB — BASIC METABOLIC PANEL
Anion gap: 11 (ref 5–15)
Anion gap: 12 (ref 5–15)
BUN: 13 mg/dL (ref 8–23)
BUN: 12 mg/dL (ref 8–23)
CO2: 22 mmol/L (ref 22–32)
CO2: 21 mmol/L — ABNORMAL LOW (ref 22–32)
Calcium: 7.8 mg/dL — ABNORMAL LOW (ref 8.9–10.3)
Calcium: 7.7 mg/dL — ABNORMAL LOW (ref 8.9–10.3)
Chloride: 83 mmol/L — ABNORMAL LOW (ref 98–111)
Chloride: 81 mmol/L — ABNORMAL LOW (ref 98–111)
Creatinine, Ser: 0.47 mg/dL (ref 0.44–1.00)
Creatinine, Ser: 0.48 mg/dL (ref 0.44–1.00)
GFR, Estimated: >60 mL/min (ref >60)
GFR, Estimated: >60 mL/min (ref >60)
Glucose, Bld: 146 mg/dL — ABNORMAL HIGH (ref 70–99)
Glucose, Bld: 164 mg/dL — ABNORMAL HIGH (ref 70–99)
Potassium: 3.6 mmol/L (ref 3.5–5.1)
Potassium: 3.8 mmol/L (ref 3.5–5.1)
Sodium: 116 mmol/L — CL (ref 135–145)
Sodium: 114 mmol/L — CL (ref 135–145)

## 2022-02-20 LAB — SODIUM, URINE, RANDOM: Sodium, Ur: 64 mmol/L

## 2022-02-20 LAB — GLUCOSE, CAPILLARY: Glucose-Capillary: 149 mg/dL — ABNORMAL HIGH (ref 70–99)

## 2022-02-20 LAB — PROCALCITONIN: Procalcitonin: 0.16 ng/mL

## 2022-02-20 LAB — SODIUM: Sodium: 116 mmol/L — CL (ref 135–145)

## 2022-02-20 LAB — TSH: TSH: 0.907 u[IU]/mL (ref 0.350–4.500)

## 2022-02-20 MED ORDER — PREDNISONE 5 MG PO TABS
5.0000 mg | ORAL_TABLET | Freq: Every day | ORAL | Status: DC
  Administered 2022-02-20 – 2022-02-23 (×4): 5 mg via ORAL
  Filled 2022-02-20 (×4): qty 1

## 2022-02-20 MED ORDER — ORAL CARE MOUTH RINSE
15.0000 mL | OROMUCOSAL | Status: DC | PRN
  Administered 2022-02-24: 15 mL via OROMUCOSAL

## 2022-02-20 MED ORDER — ENSURE ENLIVE PO LIQD
237.0000 mL | Freq: Two times a day (BID) | ORAL | Status: DC
  Administered 2022-02-20 – 2022-03-02 (×12): 237 mL via ORAL

## 2022-02-20 MED ORDER — ALUM & MAG HYDROXIDE-SIMETH 200-200-20 MG/5ML PO SUSP
30.0000 mL | Freq: Four times a day (QID) | ORAL | Status: DC | PRN
  Administered 2022-02-20 – 2022-02-26 (×2): 30 mL via ORAL
  Filled 2022-02-20 (×2): qty 30

## 2022-02-20 MED ORDER — SODIUM CHLORIDE 3 % IV SOLN
INTRAVENOUS | Status: AC
  Filled 2022-02-20 (×2): qty 500

## 2022-02-20 MED ORDER — CHLORHEXIDINE GLUCONATE CLOTH 2 % EX PADS
6.0000 | MEDICATED_PAD | Freq: Every day | CUTANEOUS | Status: DC
  Administered 2022-02-20 – 2022-02-26 (×9): 6 via TOPICAL

## 2022-02-20 MED ORDER — AMLODIPINE BESYLATE 5 MG PO TABS
5.0000 mg | ORAL_TABLET | Freq: Every day | ORAL | Status: DC
  Administered 2022-02-20: 5 mg via ORAL
  Filled 2022-02-20: qty 1

## 2022-02-20 NOTE — Progress Notes (Signed)
Physical Therapy Treatment Patient Details Name: [name] MRN: [id number] DOB: 28-Jun-1929 Today's Date: 02/20/2022   History of Present Illness Patient is a 88 y.o. female s/p L-TKA on 02/17/22 with PMH significant for seizures, HTN, GERD, breast cancer (2013), OA, polymyalgia rheumatica, R-TKA 2021, afib, HLD, RA,    PT Comments    Pt progressing slowly, requiring max to total assist of 2 for bed mobility  and transfers. Pt with decr coordination noted throughout UEs, unable to maintain grip on RW.   Pt with noted R lateral lean, in sitting and standing. RN aware. Intermittently confused.  RN contacted MD and labs ordered. Continue to follow and progress as able  Recommendations for follow up therapy are one component of a multi-disciplinary discharge planning process, led by the attending physician.  Recommendations may be updated based on patient status, additional functional criteria and insurance authorization.  Follow Up Recommendations  Other (comment) (may need SNF--family wants to take pt home)     Assistance Recommended at Discharge Frequent or constant Supervision/Assistance  Patient can return home with the following Two people to help with walking and/or transfers;Two people to help with bathing/dressing/bathroom;Assistance with cooking/housework;Direct supervision/assist for medications management;Direct supervision/assist for financial management;Assist for transportation;Help with stairs or ramp for entrance   Equipment Recommendations  None recommended by PT    Recommendations for Other Services       Precautions / Restrictions Precautions Precautions: Fall Precaution Comments: no pillow under knee Restrictions Weight Bearing Restrictions: No LLE Weight Bearing: Weight bearing as tolerated     Mobility  Bed Mobility Overal bed mobility: Needs Assistance Bed Mobility: Supine to Sit     Supine to sit: Mod assist, HOB elevated, Max assist     General  bed mobility comments: assist to complete LB rotation and progress LEs offbed and to elevate trunk. multi-moal cues for sequence and self assist    Transfers Overall transfer level: Needs assistance Equipment used: Rolling walker (2 wheels) Transfers: Sit to/from Stand, Bed to chair/wheelchair/BSC Sit to Stand: Max assist, +2 physical assistance, +2 safety/equipment Stand pivot transfers: Max assist, +2 physical assistance, +2 safety/equipment         General transfer comment: pt requiring multi-modal cues for sequence, RW position, unable to wt shift to take pivotal steps, +2 to remain upright and 3rd person to manipulate environment to brign chair behind ot and prevent fall. pt flexing trunk/hips and with notable R lateral lean    Ambulation/Gait               General Gait Details: pt was UNABLE to take any functional steps due as noted above   Stairs             Wheelchair Mobility    Modified Rankin (Stroke Patients Only)       Balance   Sitting-balance support: Single extremity supported, Bilateral upper extremity supported, Feet supported Sitting balance-Leahy Scale: Poor Sitting balance - Comments: constant cues with varying level of assist mod to close supervision Postural control: Posterior lean, Right lateral lean Standing balance support: Reliant on assistive device for balance, During functional activity, Bilateral upper extremity supported Standing balance-Leahy Scale: Zero                              Cognition Arousal/Alertness: Awake/alert Behavior During Therapy: Flat affect Overall Cognitive Status: Impaired/Different from baseline Area of Impairment: Following commands, Attention  Current Attention Level: Sustained   Following Commands: Follows one step commands with increased time, Follows multi-step commands inconsistently       General Comments: intermittently sleepy/groggy. arouses to voice         Exercises Total Joint Exercises Ankle Circles/Pumps: AROM, 20 reps, Supine, Both, Limitations Ankle Circles/Pumps Limitations: weaker pf/df on L    General Comments        Pertinent Vitals/Pain Pain Assessment Pain Assessment: Faces Faces Pain Scale: Hurts a little bit Pain Location: left knee with activity Pain Descriptors / Indicators: Discomfort, Grimacing, Guarding Pain Intervention(s): Limited activity within patient's tolerance, Monitored during session, Repositioned    Home Living                          Prior Function            PT Goals (current goals can now be found in the care plan section) Acute Rehab PT Goals Patient Stated Goal: Walking better PT Goal Formulation: With patient Time For Goal Achievement: 02/24/22 Potential to Achieve Goals: Good Progress towards PT goals: Progressing toward goals    Frequency    7X/week      PT Plan Current plan remains appropriate    Co-evaluation              AM-PAC PT "6 Clicks" Mobility   Outcome Measure  Help needed turning from your back to your side while in a flat bed without using bedrails?: Total Help needed moving from lying on your back to sitting on the side of a flat bed without using bedrails?: Total Help needed moving to and from a bed to a chair (including a wheelchair)?: Total Help needed standing up from a chair using your arms (e.g., wheelchair or bedside chair)?: Total Help needed to walk in hospital room?: Total Help needed climbing 3-5 steps with a railing? : Total 6 Click Score: 6    End of Session Equipment Utilized During Treatment: Gait belt Activity Tolerance: Patient limited by fatigue Patient left: in chair;with call bell/phone within reach;with chair alarm set;with family/visitor present Nurse Communication: Mobility status PT Visit Diagnosis: Pain;Difficulty in walking, not elsewhere classified (R26.2) Pain - Right/Left: Left Pain - part of body:  Knee     Time: 7564-3329 PT Time Calculation (min) (ACUTE ONLY): 33 min  Charges:  $Therapeutic Activity: 23-37 mins                     Baxter Flattery, PT  Acute Rehab Dept [facility]) [phone]  [facility] Weekend Pager (Saturday/Sunday only)  [phone]  02/20/2022    [facility] 02/20/2022, 3:18 PM

## 2022-02-20 NOTE — Consult Note (Signed)
Initial Consultation Note   Patient: [name] WJX:[id number] DOB: 20-May-1930 PCP: [name], MD DOA: 02/17/2022 DOS: the patient was seen and examined on 02/20/2022 Primary service: Sydnee Cabal, MD  Referring physician: Dr Sydnee Cabal  Reason for consult: Hyponatremia   Assessment and Plan:  Principal problem History of chronic arthritis, left knee - status post total knee arthroplasty on 9/13 -management and plan per orthopedics  Hyponatremia -Last sodium checked on 9/1 was 136, no labs on admission -Today patient was noted to be slower to respond and lethargic although she is oriented x3 on my exam.  -BMET showed sodium of 116 -Ordered stat serum osmolarity, urine osmolarity, urine sodium, TSH -Measured serum osmolarity 245 -Given mental status changes, will place on gentle infusion of 3% normal saline at 25 cc an hour, follow sodium every 2 hours x4, then every 4 hours to slowly correct over the next 24 hours, correction limit of 124 in next 12hors .  Continue neurochecks q1hour. -Hold Lexapro, not on any diuretics -Transfer to ICU, discussed with nephrology and CCM.  Discussed with patient's 2 daughters at the bedside, agree with the plan.  Hypertension -Continue Lopressor 25 mg twice daily, add Norvasc 5 mg daily  Polymyalgia rheumatica -Restart prednisone 5 mg daily (outpatient dose)  History of seizures/epilepsy -Continue phenobarbital  GERD - continue PPI  Leukocytosis -Unclear etiology, was 13.5 on 9/13, today 28.4. -No fevers, lungs clear, had 1 episode of vomiting this morning after Ensure.  No other infectious symptoms -Obtain UA, procalcitonin, blood cultures x2  [facility] will continue to follow the patient.  HPI: [name] is a 88 y.o. female with past medical history of hypertension, GERD, arthritis, polymyalgia rheumatica, seizures admitted under orthopedic service for left knee pain, underwent total knee arthroplasty on  9/13. Today patient was noted to be lethargic, slow to respond by the daughters (one of them is a retired Therapist, sports). BMET done today showed hyponatremia with sodium 116, last sodium 136 on 02/05/2022 Per daughters, patient had poor p.o. intake in the last 2 days.  1 episode of vomiting today after Ensure otherwise no nausea vomiting, abdominal pain or diarrhea in the last 3 days.  Not on any diuretics.  No fever chills or cough.  Review of Systems: As mentioned in the history of present illness. All other systems reviewed and are negative. Past Medical History:  Diagnosis Date   Anemia    Anxiety    Arthritis    Atrial fibrillation ([place])    Cancer (HCC)    breast Ca. on 2013   Chronic back pain    Depression    Epilepsy ([place])    GERD (gastroesophageal reflux disease)    History of kidney stones    Hyperlipidemia    Hypertension    Macular degeneration    Mitral regurgitation    Pneumonia    Polymyalgia rheumatica (HCC)    Rheumatoid arthritis ([place])    Seizures ([place])    Past Surgical History:  Procedure Laterality Date   ABDOMINAL HYSTERECTOMY     BREAST SURGERY Right    Mastectomy   CARPAL TUNNEL RELEASE Left    TONSILLECTOMY     TOTAL KNEE ARTHROPLASTY Right 01/04/2020   Procedure: TOTAL KNEE ARTHROPLASTY;  Surgeon: Sydnee Cabal, MD;  Location: [facility];  Service: Orthopedics;  Laterality: Right;  adductor canal   TOTAL KNEE ARTHROPLASTY Left 02/17/2022   Procedure: TOTAL KNEE ARTHROPLASTY;  Surgeon: Sydnee Cabal, MD;  Location: [facility];  Service: Orthopedics;  Laterality: Left;  adductor canal block 120   Social History:  reports that she has never smoked. She has never used smokeless tobacco. She reports that she does not drink alcohol and does not use drugs.  Allergies  Allergen Reactions   Sulfa Antibiotics Nausea Only   Penicillins Swelling    Tolerated Cephalosporin 01/04/20      Simvastatin Swelling    History reviewed. No pertinent family history.  Prior to  Admission medications   Medication Sig Start Date End Date Taking? Authorizing Provider  acetaminophen (TYLENOL) 500 MG tablet Take 2 tablets (1,000 mg total) by mouth every 6 (six) hours as needed. 07/09/18  Yes Charlesetta Shanks, MD  celecoxib (CELEBREX) 200 MG capsule Take 200 mg by mouth daily. 07/12/21  Yes [provider]  cholecalciferol (VITAMIN D3) 25 MCG (1000 UNIT) tablet Take 1,000 Units by mouth daily.   Yes [provider]  escitalopram (LEXAPRO) 10 MG tablet Take 10 mg by mouth daily.   Yes [provider]  ezetimibe (ZETIA) 10 MG tablet Take 10 mg by mouth daily.   Yes [provider]  metoprolol tartrate (LOPRESSOR) 25 MG tablet Take 25 mg by mouth 2 (two) times daily. 01/06/22  Yes [provider]  Multiple Vitamins-Minerals (MULTIVITAMIN WITH MINERALS) tablet Take 1 tablet by mouth daily.   Yes [provider]  pantoprazole (PROTONIX) 40 MG tablet Take 40 mg by mouth daily.  01/14/14  Yes [provider]  PHENobarbital (LUMINAL) 97.2 MG tablet Take 97.2 mg by mouth at bedtime.  05/17/16  Yes [provider]  predniSONE (DELTASONE) 5 MG tablet Take 5 mg by mouth daily with breakfast.  02/22/18  Yes [provider]  bisacodyl (DULCOLAX) 5 MG EC tablet Take 1 tablet (5 mg total) by mouth daily as needed for moderate constipation. 02/19/22   Drue Novel, PA  methocarbamol (ROBAXIN) 500 MG tablet Take 1 tablet (500 mg total) by mouth every 6 (six) hours as needed for muscle spasms. 02/19/22   Haus, Cordelia Pen, PA  ondansetron (ZOFRAN) 4 MG tablet Take 1 tablet (4 mg total) by mouth every 6 (six) hours as needed for nausea. 02/19/22   Drue Novel, PA  oxyCODONE (OXY IR/ROXICODONE) 5 MG immediate release tablet Take 1-2 tablets (5-10 mg total) by mouth every 4 (four) hours as needed for moderate pain or severe pain (pain score 4-6). 02/19/22   Haus, Cordelia Pen, PA  traMADol (ULTRAM) 50 MG tablet Take 1 tablet (50 mg  total) by mouth every 6 (six) hours. 02/19/22   Drue Novel, PA    Physical Exam: Vitals:   02/20/22 0700 02/20/22 0736 02/20/22 1033 02/20/22 1300  BP: (!) 163/79  (!) 152/71 (!) 144/77  Pulse: (!) 108 (!) 108 (!) 105   Resp: 16  17   Temp: 99.1 F (37.3 C)  98.6 F (37 C) 99.6 F (37.6 C)  TempSrc: Oral  Oral Oral  SpO2: 92%  98% 96%  Weight:      Height:        Physical Exam General: oriented x 3, but somewhat slow to respond (per daughter at the bedside, not her baseline) Cardiovascular: S1 S2 clear, RRR.  Respiratory: CTAB, no wheezing, rales or rhonchi Gastrointestinal: Soft, nontender, nondistended, NBS Ext: no pedal edema bilaterally Neuro: cranial nerves II to XII intact, slow to respond, strength 5/5 in B/L UE, unable to lift LLE due to recent surgery.  Strength 5/5 in RLE. Skin: No rashes  Psych: somewhat slower to respond  Data Reviewed:      Latest Ref Rng & Units 02/20/2022    3:28 PM 02/17/2022    3:10 PM 02/05/2022    2:26 PM  BMP  Glucose 70 - 99 mg/dL 146   129   BUN 8 - 23 mg/dL 13   13   Creatinine 0.44 - 1.00 mg/dL 0.47  0.60  0.64   Sodium 135 - 145 mmol/L 116   136   Potassium 3.5 - 5.1 mmol/L 3.6   4.5   Chloride 98 - 111 mmol/L 83   104   CO2 22 - 32 mmol/L 22   25   Calcium 8.9 - 10.3 mg/dL 7.8   9.3        Latest Ref Rng & Units 02/20/2022    3:28 PM 02/17/2022    3:10 PM 02/05/2022    2:26 PM  CBC  WBC 4.0 - 10.5 K/uL 28.4  13.5  10.8   Hemoglobin 12.0 - 15.0 g/dL 8.6  13.6  13.4   Hematocrit 36.0 - 46.0 % 24.4  42.8  40.6   Platelets 150 - 400 K/uL 198  173  188    Family Communication: Discussed  plan with the patient's 2 daughters at the bedside   Primary team communication:  Thank you very much for involving [place] in the care of your patient. [facility] will be following the patient closely.   Author: Estill Cotta, MD 02/20/2022 5:14 PM  For on call review [email or web address].

## 2022-02-20 NOTE — Plan of Care (Signed)
  Problem: Clinical Measurements: Goal: Diagnostic test results will improve Outcome: Progressing   Problem: Clinical Measurements: Goal: Respiratory complications will improve Outcome: Progressing   Problem: Clinical Measurements: Goal: Cardiovascular complication will be avoided Outcome: Progressing   Problem: Coping: Goal: Level of anxiety will decrease Outcome: Progressing   Problem: Skin Integrity: Goal: Risk for impaired skin integrity will decrease Outcome: Progressing

## 2022-02-20 NOTE — Progress Notes (Addendum)
[name] provider notified of rn and family concerns for pt weakness and coordination issues. Pt is having trouble getting her arms and hands to reach to her mouth eat and drink. She is also leaning to the right side as well. Pt continues to have an odd tongue movement as well. Provider felt this was coming from delirium and effects of anaesthesia after surgery.  Labs ordered for pt. Pt remains stable with no needs. No changes from am assessment other than pt heartburn and nausea.

## 2022-02-20 NOTE — Progress Notes (Signed)
   Subjective: 3 Days Post-Op Procedure(s) (LRB): TOTAL KNEE ARTHROPLASTY (Left)  Pt c/o moderate pain/soreness in the knee Progress is slow with therapy Daughter concerned about post operative confusion but none seen today Otherwise doing fair Patient reports pain as mild.  Objective:   VITALS:   Vitals:   02/20/22 0700 02/20/22 0736  BP: (!) 163/79   Pulse: (!) 108 (!) 108  Resp: 16   Temp: 99.1 F (37.3 C)   SpO2: 92%     Left knee incision healing well Nv intact distally No rashes or signs of infection Minimal edema distally Mild guarding with rom  LABS Recent Labs    02/17/22 1510  HGB 13.6  HCT 42.8  WBC 13.5*  PLT 173    Recent Labs    02/17/22 1510  CREATININE 0.60     Assessment/Plan: 3 Days Post-Op Procedure(s) (LRB): TOTAL KNEE ARTHROPLASTY (Left) Continue PT/OT Encourage mobilization Pain management as needed Possible d/c tomorrow vs Monday   Merla Riches PA-C, MPAS [facility] is now [facility]  [place] [address] [place], [place], [place] [address] Phone: [phone] [email or web address]  [employer]

## 2022-02-20 NOTE — Progress Notes (Signed)
Pt to room 1240 in recliner in stable condition. No needs at time of transfer.

## 2022-02-20 NOTE — Consult Note (Addendum)
Nephrology Consult   Requesting provider: Sydnee Cabal Service requesting consult: Ortho Reason for consult: Hyponatremia   Assessment/Recommendations: [name] is a/an 88 y.o. female with a past medical history HLD, depression, GERD, depression, HTN, seizures who presents with planned left total knee replacement.    Severe Symptomatic Hyponatremia: possibly acute but no labs for comparison within 24-48 hours so will need to treat as chronic. She is symptomatic.  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  Titration as needed  Leukocytosis: We will work-up for possible infection.  Likely related to surgery  Total knee arthroplasty: Ortho following   Recommendations conveyed to primary service.    [facility] 02/20/2022 5:32 PM   _____________________________________________________________________________________ CC: AMS  History of Present Illness: [name] is a/an 88 y.o. female with a past medical history of HLD, depression, GERD, depression, HTN, seizures who presents with planned left total knee replacement.  The patient was altered on my assessment so the history was mainly obtained per chart review  Patient underwent total knee arthroplasty on 02/17/2022.  The patient had been having chronic knee pain for some time.  No issues with the procedure as far as I can tell.  It was noted by the primary team that the patient was having more altered mental status today.  She was also noted to be somewhat tachycardic.  Hospitalist team was called who performed labs and found  sodium to be 116.  Sodium was normal on 02/05/2022.  No labs between today and that time.  Is also notable that the patient has anemia with hemoglobin of 8.6 and leukocytosis with a white blood cell count of 28.4.  On my assessment the patient states she feels okay.  She is not completely oriented.  States that she has had some nausea and vomiting earlier today.  She denies any history of issues with her sodium.  She does take an SSRI at home.  No thiazide use.  Denies current pain.   Medications:  Current Facility-Administered Medications  Medication Dose Route Frequency Provider Last Rate Last Admin   acetaminophen (TYLENOL) tablet 325-650 mg  325-650 mg Oral Q6H PRN Drue Novel, PA   650 mg at 02/20/22 0442   alum & mag hydroxide-simeth (MAALOX/MYLANTA) 200-200-20 MG/5ML suspension 30 mL  30 mL Oral Q6H PRN Cline Crock, PA-C   30 mL at 02/20/22 1113   bisacodyl (DULCOLAX) EC tablet 5 mg  5 mg Oral Daily PRN Haus, Cordelia Pen, PA       Chlorhexidine Gluconate Cloth 2 % PADS 6 each  6 each Topical Daily Sydnee Cabal, MD       diphenhydrAMINE (BENADRYL) 12.5 MG/5ML elixir 12.5-25 mg  12.5-25 mg Oral Q4H PRN Haus, Leeanne R, PA       enoxaparin (LOVENOX) injection 40 mg  40 mg Subcutaneous Q24H Haus, Leeanne R, PA   40 mg at 02/20/22 0814   escitalopram (LEXAPRO) tablet 10 mg  10 mg Oral Daily Haus, Leeanne R, PA   10 mg at 02/20/22 0814   ezetimibe (ZETIA) tablet 10 mg  10 mg Oral Daily Haus, Leeanne R, PA   10  mg at 02/20/22 0814   feeding supplement (ENSURE ENLIVE / ENSURE PLUS) liquid 237 mL  237 mL Oral BID BM Cline Crock, PA-C   237 mL at 02/20/22 1115   HYDROmorphone (DILAUDID) injection 0.5-1 mg  0.5-1 mg Intravenous Q4H PRN Elizabeth Sauer R, PA   0.5 mg at 02/17/22 1706   magnesium citrate solution 1 Bottle  1 Bottle Oral Once PRN Elizabeth Sauer R, PA       methocarbamol (ROBAXIN) tablet 500 mg  500 mg Oral Q6H PRN Haus, Leeanne R, PA   500 mg at 02/18/22 1450   Or    methocarbamol (ROBAXIN) 500 mg in dextrose 5 % 50 mL IVPB  500 mg Intravenous Q6H PRN Haus, Leeanne R, PA       metoprolol tartrate (LOPRESSOR) tablet 25 mg  25 mg Oral BID Haus, Leeanne R, PA   25 mg at 02/20/22 0814   ondansetron (ZOFRAN) tablet 4 mg  4 mg Oral Q6H PRN Elizabeth Sauer R, PA       Or   ondansetron (ZOFRAN) injection 4 mg  4 mg Intravenous Q6H PRN Haus, Leeanne R, PA   4 mg at 02/20/22 1142   Oral care mouth rinse  15 mL Mouth Rinse PRN Sydnee Cabal, MD       oxyCODONE (Oxy IR/ROXICODONE) immediate release tablet 10-15 mg  10-15 mg Oral Q4H PRN Haus, Leeanne R, PA   10 mg at 02/18/22 1255   oxyCODONE (Oxy IR/ROXICODONE) immediate release tablet 5-10 mg  5-10 mg Oral Q4H PRN Elizabeth Sauer R, PA   10 mg at 02/18/22 0228   pantoprazole (PROTONIX) EC tablet 40 mg  40 mg Oral Daily Elizabeth Sauer R, PA   40 mg at 02/20/22 0815   PHENobarbital (LUMINAL) tablet 97.2 mg  97.2 mg Oral QHS Haus, Leeanne R, PA   97.2 mg at 02/19/22 2113   predniSONE (DELTASONE) tablet 5 mg  5 mg Oral QAC breakfast Rai, Ripudeep K, MD       senna-docusate (Senokot-S) tablet 1 tablet  1 tablet Oral QHS PRN Haus, Leeanne R, PA       sodium chloride (hypertonic) 3 % solution   Intravenous Continuous Rai, Ripudeep K, MD       traMADol (ULTRAM) tablet 25 mg  25 mg Oral Q6H PRN Haus, Leeanne R, PA         ALLERGIES Sulfa antibiotics, Penicillins, and Simvastatin  MEDICAL HISTORY Past Medical History:  Diagnosis Date   Anemia    Anxiety    Arthritis    Atrial fibrillation (HCC)    Cancer (HCC)    breast Ca. on 2013   Chronic back pain    Depression    Epilepsy ([place])    GERD (gastroesophageal reflux disease)    History of kidney stones    Hyperlipidemia    Hypertension    Macular degeneration    Mitral regurgitation    Pneumonia    Polymyalgia rheumatica (HCC)    Rheumatoid arthritis ([place])    Seizures ([place])      SOCIAL HISTORY Social History   Socioeconomic History   Marital status:  Widowed    Spouse name: Not on file   Number of children: Not on file   Years of education: Not on file   Highest education level: Not on file  Occupational History   Not on file  Tobacco Use   Smoking status: Never   Smokeless tobacco: Never  Vaping Use  Vaping Use: Never used  Substance and Sexual Activity   Alcohol use: Never   Drug use: Never   Sexual activity: Not on file  Other Topics Concern   Not on file  Social History Narrative   Not on file   Social Determinants of Health   Financial Resource Strain: Not on file  Food Insecurity: No Food Insecurity (02/17/2022)   Hunger Vital Sign    Worried About Running Out of Food in the Last Year: Never true    Ran Out of Food in the Last Year: Never true  Transportation Needs: No Transportation Needs (02/17/2022)   PRAPARE - Hydrologist (Medical): No    Lack of Transportation (Non-Medical): No  Physical Activity: Not on file  Stress: Not on file  Social Connections: Not on file  Intimate Partner Violence: Not At Risk (02/17/2022)   Humiliation, Afraid, Rape, and Kick questionnaire    Fear of Current or Ex-Partner: No    Emotionally Abused: No    Physically Abused: No    Sexually Abused: No     FAMILY HISTORY History reviewed. No pertinent family history. Unable to obtain due to the patient's AMS   Review of Systems: 12 systems reviewed Otherwise as per HPI, all other systems reviewed and negative  Physical Exam: Vitals:   02/20/22 1033 02/20/22 1300  BP: (!) 152/71 (!) 144/77  Pulse: (!) 105   Resp: 17   Temp: 98.6 F (37 C) 99.6 F (37.6 C)  SpO2: 98% 96%   Total I/O In: 480.6 [P.O.:480; I.V.:0.6] Out: 1100 [Urine:1100]  Intake/Output Summary (Last 24 hours) at 02/20/2022 1732 Last data filed at 02/20/2022 1726 Gross per 24 hour  Intake 1304.15 ml  Output 1400 ml  Net -95.85 ml   General: Elderly, sitting in chair, no distress HEENT: anicteric sclera, oropharynx clear  without lesions CV: Tachycardia, no murmurs, trace edema in the left lower extremity Lungs: clear to auscultation bilaterally, normal work of breathing Abd: soft, non-tender, non-distended Skin: no visible lesions or rashes Psych: Tired but awake and engaged, appropriate mood and affect Musculoskeletal: Left knee with bandage in place, otherwise no obvious deformities Neuro: Delayed speech, somewhat somnolent, moves all 4 extremities, oriented to person and place but not time, difficulty answering some questions  Test Results Reviewed Lab Results  Component Value Date   NA 116 (LL) 02/20/2022   K 3.6 02/20/2022   CL 83 (L) 02/20/2022   CO2 22 02/20/2022   BUN 13 02/20/2022   CREATININE 0.47 02/20/2022   CALCIUM 7.8 (L) 02/20/2022   ALBUMIN 4.1 02/05/2022    CBC Recent Labs  Lab 02/17/22 1510 02/20/22 1528  WBC 13.5* 28.4*  HGB 13.6 8.6*  HCT 42.8 24.4*  MCV 98.2 91.4  PLT 173 198    I have reviewed all relevant outside healthcare records related to the patient's current hospitalization

## 2022-02-20 NOTE — Progress Notes (Signed)
Report called to [facility] (ICU) without complication. Dr. Tana Coast with pt. Family at bedside. Pt remains stable. Rn will transfer pt to room 1240 when staff is available.

## 2022-02-20 NOTE — Plan of Care (Signed)
  Problem: Clinical Measurements: Goal: Ability to maintain clinical measurements within normal limits will improve Outcome: Progressing   Problem: Pain Managment: Goal: General experience of comfort will improve Outcome: Progressing   Problem: Skin Integrity: Goal: Risk for impaired skin integrity will decrease Outcome: Progressing   

## 2022-02-20 NOTE — Progress Notes (Signed)
CRITICAL VALUE STICKER  CRITICAL VALUE: Na 116  RECEIVER (on-site recipient of call): Geoffry Paradise RN  DATE & TIME NOTIFIED: 02-20-22 1624  MESSENGER (representative from lab): Lexine Baton  MD NOTIFIED: Merla Riches  TIME OF NOTIFICATION: 1624  RESPONSE: Provider to get medical involved

## 2022-02-21 ENCOUNTER — Inpatient Hospital Stay (HOSPITAL_COMMUNITY): Payer: Medicare Other

## 2022-02-21 DIAGNOSIS — E871 Hypo-osmolality and hyponatremia: Secondary | ICD-10-CM

## 2022-02-21 DIAGNOSIS — J9601 Acute respiratory failure with hypoxia: Secondary | ICD-10-CM | POA: Diagnosis not present

## 2022-02-21 DIAGNOSIS — D72829 Elevated white blood cell count, unspecified: Secondary | ICD-10-CM | POA: Diagnosis not present

## 2022-02-21 DIAGNOSIS — M1712 Unilateral primary osteoarthritis, left knee: Secondary | ICD-10-CM | POA: Diagnosis present

## 2022-02-21 DIAGNOSIS — I340 Nonrheumatic mitral (valve) insufficiency: Secondary | ICD-10-CM | POA: Diagnosis present

## 2022-02-21 DIAGNOSIS — E669 Obesity, unspecified: Secondary | ICD-10-CM | POA: Diagnosis present

## 2022-02-21 DIAGNOSIS — F32A Depression, unspecified: Secondary | ICD-10-CM | POA: Diagnosis present

## 2022-02-21 DIAGNOSIS — I272 Pulmonary hypertension, unspecified: Secondary | ICD-10-CM | POA: Diagnosis present

## 2022-02-21 DIAGNOSIS — R197 Diarrhea, unspecified: Secondary | ICD-10-CM | POA: Diagnosis not present

## 2022-02-21 DIAGNOSIS — I10 Essential (primary) hypertension: Secondary | ICD-10-CM | POA: Diagnosis present

## 2022-02-21 DIAGNOSIS — R578 Other shock: Secondary | ICD-10-CM | POA: Diagnosis not present

## 2022-02-21 DIAGNOSIS — G9341 Metabolic encephalopathy: Secondary | ICD-10-CM

## 2022-02-21 DIAGNOSIS — I2510 Atherosclerotic heart disease of native coronary artery without angina pectoris: Secondary | ICD-10-CM

## 2022-02-21 DIAGNOSIS — I4891 Unspecified atrial fibrillation: Secondary | ICD-10-CM | POA: Diagnosis not present

## 2022-02-21 DIAGNOSIS — I959 Hypotension, unspecified: Secondary | ICD-10-CM | POA: Diagnosis not present

## 2022-02-21 DIAGNOSIS — Z66 Do not resuscitate: Secondary | ICD-10-CM | POA: Diagnosis not present

## 2022-02-21 DIAGNOSIS — Y95 Nosocomial condition: Secondary | ICD-10-CM | POA: Diagnosis not present

## 2022-02-21 DIAGNOSIS — N179 Acute kidney failure, unspecified: Secondary | ICD-10-CM | POA: Diagnosis not present

## 2022-02-21 DIAGNOSIS — M069 Rheumatoid arthritis, unspecified: Secondary | ICD-10-CM | POA: Diagnosis present

## 2022-02-21 DIAGNOSIS — J440 Chronic obstructive pulmonary disease with acute lower respiratory infection: Secondary | ICD-10-CM | POA: Diagnosis not present

## 2022-02-21 DIAGNOSIS — I480 Paroxysmal atrial fibrillation: Secondary | ICD-10-CM | POA: Diagnosis not present

## 2022-02-21 DIAGNOSIS — E876 Hypokalemia: Secondary | ICD-10-CM | POA: Diagnosis not present

## 2022-02-21 DIAGNOSIS — J189 Pneumonia, unspecified organism: Secondary | ICD-10-CM | POA: Diagnosis not present

## 2022-02-21 DIAGNOSIS — D62 Acute posthemorrhagic anemia: Secondary | ICD-10-CM | POA: Diagnosis not present

## 2022-02-21 DIAGNOSIS — G40909 Epilepsy, unspecified, not intractable, without status epilepticus: Secondary | ICD-10-CM | POA: Diagnosis present

## 2022-02-21 DIAGNOSIS — R338 Other retention of urine: Secondary | ICD-10-CM | POA: Diagnosis not present

## 2022-02-21 DIAGNOSIS — E222 Syndrome of inappropriate secretion of antidiuretic hormone: Secondary | ICD-10-CM | POA: Diagnosis not present

## 2022-02-21 LAB — CBC
HCT: 22.2 % — ABNORMAL LOW (ref 36.0–46.0)
Hemoglobin: 7.9 g/dL — ABNORMAL LOW (ref 12.0–15.0)
MCH: 32.2 pg (ref 26.0–34.0)
MCHC: 35.6 g/dL (ref 30.0–36.0)
MCV: 90.6 fL (ref 80.0–100.0)
Platelets: 213 10*3/uL (ref 150–400)
RBC: 2.45 MIL/uL — ABNORMAL LOW (ref 3.87–5.11)
RDW: 13.2 % (ref 11.5–15.5)
WBC: 22.1 10*3/uL — ABNORMAL HIGH (ref 4.0–10.5)
nRBC: 0 % (ref 0.0–0.2)

## 2022-02-21 LAB — ECHOCARDIOGRAM COMPLETE
AR max vel: 1.78 cm2
AV Peak grad: 9.6 mmHg
Ao pk vel: 1.55 m/s
Area-P 1/2: 6.43 cm2
Height: 61 in
S' Lateral: 2.3 cm
Weight: 2574.97 oz

## 2022-02-21 LAB — BASIC METABOLIC PANEL
Anion gap: 7 (ref 5–15)
Anion gap: 8 (ref 5–15)
BUN: 11 mg/dL (ref 8–23)
BUN: 14 mg/dL (ref 8–23)
CO2: 22 mmol/L (ref 22–32)
CO2: 21 mmol/L — ABNORMAL LOW (ref 22–32)
Calcium: 8 mg/dL — ABNORMAL LOW (ref 8.9–10.3)
Calcium: 7.8 mg/dL — ABNORMAL LOW (ref 8.9–10.3)
Chloride: 91 mmol/L — ABNORMAL LOW (ref 98–111)
Chloride: 95 mmol/L — ABNORMAL LOW (ref 98–111)
Creatinine, Ser: 0.54 mg/dL (ref 0.44–1.00)
Creatinine, Ser: 0.54 mg/dL (ref 0.44–1.00)
GFR, Estimated: >60 mL/min (ref >60)
GFR, Estimated: >60 mL/min (ref >60)
Glucose, Bld: 132 mg/dL — ABNORMAL HIGH (ref 70–99)
Glucose, Bld: 137 mg/dL — ABNORMAL HIGH (ref 70–99)
Potassium: 3.4 mmol/L — ABNORMAL LOW (ref 3.5–5.1)
Potassium: 4.5 mmol/L (ref 3.5–5.1)
Sodium: 120 mmol/L — ABNORMAL LOW (ref 135–145)
Sodium: 124 mmol/L — ABNORMAL LOW (ref 135–145)

## 2022-02-21 LAB — SODIUM
Sodium: 117 mmol/L — CL (ref 135–145)
Sodium: 120 mmol/L — ABNORMAL LOW (ref 135–145)
Sodium: 123 mmol/L — ABNORMAL LOW (ref 135–145)
Sodium: 125 mmol/L — ABNORMAL LOW (ref 135–145)

## 2022-02-21 LAB — OSMOLALITY, URINE: Osmolality, Ur: 520 mOsm/kg (ref 300–900)

## 2022-02-21 LAB — CORTISOL: Cortisol, Plasma: 27 ug/dL

## 2022-02-21 LAB — BRAIN NATRIURETIC PEPTIDE: B Natriuretic Peptide: 218.1 pg/mL — ABNORMAL HIGH (ref 0.0–100.0)

## 2022-02-21 LAB — PREPARE RBC (CROSSMATCH)

## 2022-02-21 MED ORDER — AMIODARONE LOAD VIA INFUSION
150.0000 mg | Freq: Once | INTRAVENOUS | Status: DC
  Filled 2022-02-21: qty 83.34

## 2022-02-21 MED ORDER — FUROSEMIDE 10 MG/ML IJ SOLN
20.0000 mg | Freq: Once | INTRAMUSCULAR | Status: AC
  Administered 2022-02-22: 20 mg via INTRAVENOUS
  Filled 2022-02-21: qty 2

## 2022-02-21 MED ORDER — BISACODYL 10 MG RE SUPP
10.0000 mg | Freq: Once | RECTAL | Status: AC
  Administered 2022-02-21: 10 mg via RECTAL
  Filled 2022-02-21: qty 1

## 2022-02-21 MED ORDER — SODIUM CHLORIDE 0.9% IV SOLUTION: Freq: Once | INTRAVENOUS | Status: AC

## 2022-02-21 MED ORDER — AMIODARONE HCL IN DEXTROSE 360-4.14 MG/200ML-% IV SOLN: 30.0000 mg/h | INTRAVENOUS | Status: DC

## 2022-02-21 MED ORDER — SODIUM CHLORIDE 3 % IV SOLN
INTRAVENOUS | Status: DC
  Filled 2022-02-21 (×4): qty 500

## 2022-02-21 MED ORDER — AMIODARONE HCL IN DEXTROSE 360-4.14 MG/200ML-% IV SOLN: 60.0000 mg/h | INTRAVENOUS | Status: DC

## 2022-02-21 MED ORDER — DILTIAZEM HCL 25 MG/5ML IV SOLN
10.0000 mg | Freq: Once | INTRAVENOUS | Status: AC
  Administered 2022-02-21: 10 mg via INTRAVENOUS
  Filled 2022-02-21: qty 5

## 2022-02-21 MED ORDER — METOPROLOL TARTRATE 25 MG PO TABS
50.0000 mg | ORAL_TABLET | Freq: Two times a day (BID) | ORAL | Status: DC
  Administered 2022-02-21 – 2022-02-22 (×4): 50 mg via ORAL
  Filled 2022-02-21 (×4): qty 2

## 2022-02-21 MED ORDER — POTASSIUM CHLORIDE CRYS ER 20 MEQ PO TBCR
40.0000 meq | EXTENDED_RELEASE_TABLET | Freq: Once | ORAL | Status: AC
  Administered 2022-02-21: 40 meq via ORAL
  Filled 2022-02-21: qty 2

## 2022-02-21 MED ORDER — FUROSEMIDE 10 MG/ML IJ SOLN
20.0000 mg | Freq: Once | INTRAMUSCULAR | Status: AC
  Filled 2022-02-21: qty 2

## 2022-02-21 NOTE — Progress Notes (Signed)
PT Cancellation Note  Patient Details Name: [name] MRN: [id number] DOB: [birth year]   Cancelled Treatment:    Reason Eval/Treat Not Completed: Medical issues which prohibited therapy. Pt ws transferred to ICU last pm d/t critical Na+ of 116, 120 today with soft BP and incr RR at rest. Will defer PT today.   [facility] 02/21/2022, 9:01 AM

## 2022-02-21 NOTE — Progress Notes (Addendum)
[facility] consult progress note                                                                               [name], is a 88 y.o. female, DOB - 1930-03-28, KZS:[id number] Admit date - 02/17/2022    Outpatient Primary MD for the patient is Velazquez, Fransico Meadow., MD  LOS - 0  days  No chief complaint on file.      Brief summary   [name] is a 88 y.o. female with past medical history of hypertension, GERD, arthritis, polymyalgia rheumatica, seizures admitted under orthopedic service for left knee pain, underwent total knee arthroplasty on 9/13. Today patient was noted to be lethargic, slow to respond by the daughters (one of them is a retired Therapist, sports). BMET done today showed hyponatremia with sodium 116, last sodium 136 on 02/05/2022 Per daughters, patient had poor p.o. intake in the last 2 days.  1 episode of vomiting today after Ensure otherwise no nausea vomiting, abdominal pain or diarrhea in the last 3 days.  Not on any diuretics.  No fever chills or cough.   Assessment & Plan    Principal problem History of chronic arthritis, left knee - status post total knee arthroplasty on 9/13 -management and plan per orthopedics   Hyponatremia -Last sodium checked on 9/1 was 136, no labs on admission, noted to be lethargic and slower to respond on 9/16.  Sodium 116 on bmet -Started on 3% NS, serial bmet,  Na improving to 120 this morning  -Measured serum osmolarity 245, TSH 0.9, urine osmolality 520, urine sodium 64, likely SIADH pattern postoperative.  On regular diet with fluid restriction. -Continue to hold Lexapro.  Not on diuretics PTA. -Nephrology following  Normocytic anemia, postop -Hemoglobin this a.m. 7.9, was 8.6 on 9/16, 13.6 on 9/13, baseline around 13-14 -ordered 2 units packed RBC transfusion   Paroxysmal Afib with RVR:  -Patient has a history of paroxysmal A-fib, follows Dr. Otho Perl (cardiology), was not on anticoagulation prior to  admission, was doing fairly well in NSR. -  CHA2DS2-VASc 4, (HTN, age >76 (doubled), female) currently holding off on anticoagulation given anemia -Heart rate 110-116 this a.m., given Cardizem 10 mg IV x1, increase metoprolol to 50 mg twice daily -No recent echo in epic, will order 2D echo, ekg - d/w Dr. Domenic Polite, cardiology, will start on amiodarone drip per recommendations, okay to hold off on heparin/AC for now.  Cardiology will follow in a.m. Addendum 12:40 PM EKG done, patient is back in normal sinus rhythm, TWI in lateral leads -Will hold off on starting amiodarone now    Hypertension -Increase metoprolol to 50 mg twice daily  Polymyalgia rheumatica -Resumed prednisone 5 mg daily (outpatient dose)   History of seizures/epilepsy -Continue phenobarbital   GERD - continue PPI   Leukocytosis -Unclear etiology, was 13.5 on 9/13,  28.4-> 22.1 today. -No fevers, lungs clear, had 1 episode of vomiting yesterday, no other infectious symptoms.  -CXR 9/16 negative for any pneumonia, UA negative for UTI.  Procalcitonin 0.16 -Leukocytosis trending down, afebrile, hold off on antibiotics unless source clear  Obesity Estimated body mass index is  30.41 kg/m as calculated from the following:   Height as of this encounter: '5\' 1"'$  (1.549 m).   Weight as of this encounter: 73 kg.  Code Status: Full code DVT Prophylaxis:  enoxaparin (LOVENOX) injection 40 mg Start: 02/18/22 0800 SCDs Start: 02/17/22 1101   Level of Care: Level of care: Med-Surg Family Communication: Updated patient's daughter yesterday   Disposition Plan:      Remains inpatient appropriate: Hyponatremia, atrial fibrillation with RVR, anemia  Antimicrobials:   Anti-infectives (From admission, onward)    Start     Dose/Rate Route Frequency Ordered Stop   02/17/22 1800  ceFAZolin (ANCEF) IVPB 1 g/50 mL premix        1 g 100 mL/hr over 30 Minutes Intravenous Every 6 hours 02/17/22 1105 02/18/22 0643   02/17/22 0915   ceFAZolin (ANCEF) IVPB 2g/100 mL premix        2 g 200 mL/hr over 30 Minutes Intravenous On call to O.R. 02/17/22 0905 02/17/22 1153          Medications  Chlorhexidine Gluconate Cloth  6 each Topical Daily   enoxaparin (LOVENOX) injection  40 mg Subcutaneous Q24H   ezetimibe  10 mg Oral Daily   feeding supplement  237 mL Oral BID BM   metoprolol tartrate  50 mg Oral BID   pantoprazole  40 mg Oral Daily   PHENobarbital  97.2 mg Oral QHS   predniSONE  5 mg Oral QAC breakfast      Subjective:   [name] was seen and examined today.  Still somewhat slow to respond but oriented.  Sodium improving, 120 today.  On exam, noted in A-fib with RVR on the monitor HR up to 120 .  No acute chest pain, shortness of breath or bleeding issues.  Objective:   Vitals:   02/21/22 0600 02/21/22 0700 02/21/22 0800 02/21/22 0900  BP: (!) 141/66 (!) 138/49 (!) 113/45 (!) 128/45  Pulse: (!) 125 75 90 81  Resp: (!) 24 (!) 27 (!) 29 (!) 28  Temp:      TempSrc:      SpO2: 94% 98% (!) 87% 95%  Weight:      Height:        Intake/Output Summary (Last 24 hours) at 02/21/2022 1001 Last data filed at 02/21/2022 0600 Gross per 24 hour  Intake 615.62 ml  Output 1200 ml  Net -584.38 ml     Wt Readings from Last 3 Encounters:  02/20/22 73 kg  02/05/22 65.5 kg  12/24/21 67 kg     Exam General: Alert and oriented x 3, NAD Cardiovascular: irregularly irregular, tachycardia Respiratory: CTA B Gastrointestinal: Soft, nontender, nondistended, + bowel sounds Ext: no pedal edema bilaterally Neuro: no new FND's Psych: oriented x3, still somewhat slower to respond    Data Reviewed:  I have personally reviewed following labs    CBC Lab Results  Component Value Date   WBC 22.1 (H) 02/21/2022   RBC 2.45 (L) 02/21/2022   HGB 7.9 (L) 02/21/2022   HCT 22.2 (L) 02/21/2022   MCV 90.6 02/21/2022   MCH 32.2 02/21/2022   PLT 213 02/21/2022   MCHC 35.6 02/21/2022   RDW 13.2 02/21/2022    LYMPHSABS 5.9 (H) 05/15/2021   MONOABS 1.1 (H) 05/15/2021   EOSABS 0.1 05/15/2021   BASOSABS 0.1 02/77/4128     Last metabolic panel Lab Results  Component Value Date   NA 120 (L) 02/21/2022   K 3.4 (L) 02/21/2022   CL 91 (  L) 02/21/2022   CO2 22 02/21/2022   BUN 11 02/21/2022   CREATININE 0.54 02/21/2022   GLUCOSE 132 (H) 02/21/2022   GFRNONAA >60 02/21/2022   GFRAA >60 02/06/2020   CALCIUM 8.0 (L) 02/21/2022   PROT 6.6 02/05/2022   ALBUMIN 4.1 02/05/2022   BILITOT 0.5 02/05/2022   ALKPHOS 73 02/05/2022   AST 18 02/05/2022   ALT 16 02/05/2022   ANIONGAP 7 02/21/2022    CBG (last 3)  Recent Labs    02/20/22 0746  GLUCAP 149*       Radiology Studies: I have personally reviewed the imaging studies  DG CHEST PORT 1 VIEW  Result Date: 02/20/2022 CLINICAL DATA:  Shortness of breath. EXAM: PORTABLE CHEST 1 VIEW COMPARISON:  Chest x-ray dated July 29, 2021. CT chest dated May 15, 2021. FINDINGS: The heart size and mediastinal contours are within normal limits. Normal pulmonary vascularity. Minimal left and mild right linear atelectasis/scarring in both mid to lower lungs. No focal consolidation, pleural effusion, or pneumothorax. No acute osseous abnormality. IMPRESSION: 1. No active disease. Electronically Signed   By: Titus Dubin M.D.   On: 02/20/2022 09:48       [name] M.D. [facility] 02/21/2022, 10:01 AM  Available via Epic secure chat 7am-7pm After 7 pm, please refer to night coverage provider listed on amion.

## 2022-02-21 NOTE — Progress Notes (Signed)
Orthopedics Progress Note  Subjective: Patient remains fairly sleepy and lethargic in the stepdown/ICU following total knee replacement. She is not wanting any pain meds per the nurse  Objective:  Vitals:   02/21/22 0800 02/21/22 0900  BP: (!) 113/45 (!) 128/45  Pulse: 90 81  Resp: (!) 29 (!) 28  Temp:    SpO2: (!) 87% 95%    General: Awake and alert  Musculoskeletal: Left knee dressing in place (Aquacel) with a couple of small areas of spotting on the bandage. Moderate bruising and edema in the leg.  Neurovascularly intact  Lab Results  Component Value Date   WBC 22.1 (H) 02/21/2022   HGB 7.9 (L) 02/21/2022   HCT 22.2 (L) 02/21/2022   MCV 90.6 02/21/2022   PLT 213 02/21/2022       Component Value Date/Time   NA 120 (L) 02/21/2022 0627   K 3.4 (L) 02/21/2022 0627   CL 91 (L) 02/21/2022 0627   CO2 22 02/21/2022 0627   GLUCOSE 132 (H) 02/21/2022 0627   BUN 11 02/21/2022 0627   CREATININE 0.54 02/21/2022 0627   CALCIUM 8.0 (L) 02/21/2022 0627   GFRNONAA >60 02/21/2022 0627   GFRAA >60 02/06/2020 0218    Lab Results  Component Value Date   INR 1.1 01/04/2020    Assessment/Plan: POD #3 s/p Procedure(s): TOTAL KNEE ARTHROPLASTY Patient remains lethargic and is dealing with multiple medical issues including hyponatremia and A-Fib/RVR and leukocytosis.  Therapy as tolerated, likely in the chair to prevent stiffness.  DVT prophylaxis with Lovenox/ SCDs Appreciate IM, Cards, CCM, Cardiology assistance Acute Blood Loss Anemia - based on lethargy and declining Hgb, would recommend transfusion of 2 units PRBCs if ok per Cards/CCM/Nephrology. Pre-op patient had a normal Hgb.   Doran Heater. Veverly Fells, MD [phone] 02/21/2022 9:53 AM

## 2022-02-21 NOTE — Progress Notes (Signed)
OT Cancellation Note  Patient Details Name: [name] MRN: [id number] DOB: 12-17-1929   Cancelled Treatment:    Reason Eval/Treat Not Completed: Patient not medically ready Patient was transitioned to ICU last PM with critical Na+ 116, today is 120 with soft BP and increased RR. OT to continue to follow and check back as schedule will allow.  Rennie Plowman, [facility] Office# [phone]  02/21/2022, 12:02 PM

## 2022-02-21 NOTE — Progress Notes (Signed)
Nephrology Follow-Up Consult note   Assessment/Recommendations: [name] is a/an 88 y.o. female with a past medical history significant for HLD, depression, GERD, depression, HTN, seizures who presents with planned left total knee replacement.     Severe Symptomatic Hyponatremia: possibly acute but no labs for comparison within 24-48 hours so will need to treat as chronic. She remains symptomatic.  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  Titration as needed   Leukocytosis: We will work-up for possible infection.  Likely related to surgery   Total knee arthroplasty: Ortho following  Hypokalemia: replacing orally     Recommendations conveyed to primary service.    [facility] 02/21/2022 11:59 AM  ___________________________________________________________  CC: AMS  Interval History/Subjective: Patient continues to be tired but states she feels well with no nausea or vomiting. Na slowly improving to 120 this morning.   Medications:  Current Facility-Administered Medications  Medication Dose Route Frequency Provider Last Rate Last Admin   0.9 %  sodium chloride infusion (Manually program via Guardrails IV Fluids)   Intravenous Once Rai, Ripudeep K, MD       0.9 %  sodium chloride infusion (Manually program via Guardrails IV Fluids)   Intravenous Once Netta Cedars, MD       acetaminophen (TYLENOL) tablet 325-650 mg  325-650 mg Oral Q6H PRN Drue Novel, PA   650 mg at 02/20/22 0442   alum & mag hydroxide-simeth (MAALOX/MYLANTA) 200-200-20  MG/5ML suspension 30 mL  30 mL Oral Q6H PRN Cline Crock, PA-C   30 mL at 02/20/22 1113   amiodarone (NEXTERONE) 1.8 mg/mL load via infusion 150 mg  150 mg Intravenous Once Rai, Ripudeep K, MD       Followed by   amiodarone (NEXTERONE PREMIX) 360-4.14 MG/200ML-% (1.8 mg/mL) IV infusion  60 mg/hr Intravenous Continuous Rai, Ripudeep K, MD       Followed by   amiodarone (NEXTERONE PREMIX) 360-4.14 MG/200ML-% (1.8 mg/mL) IV infusion  30 mg/hr Intravenous Continuous Rai, Ripudeep K, MD       bisacodyl (DULCOLAX) EC tablet 5 mg  5 mg Oral Daily PRN Haus, Leeanne R, PA       Chlorhexidine Gluconate Cloth 2 % PADS 6 each  6 each Topical Daily Sydnee Cabal, MD   6 each at 02/21/22 1023   diphenhydrAMINE (BENADRYL) 12.5 MG/5ML elixir 12.5-25 mg  12.5-25 mg Oral Q4H PRN Haus, Leeanne R, PA       enoxaparin (LOVENOX) injection 40 mg  40 mg Subcutaneous Q24H Haus, Leeanne R, PA   40 mg at 02/21/22 0801   ezetimibe (ZETIA) tablet 10 mg  10 mg Oral Daily Haus, Leeanne R, PA   10 mg at 02/21/22 1042   feeding supplement (ENSURE ENLIVE / ENSURE PLUS) liquid 237 mL  237 mL Oral BID BM Dixon, Robb Matar, PA-C   237 mL at 02/21/22 1023   furosemide (LASIX) injection 20 mg  20 mg Intravenous Once Rai, Ripudeep K, MD       furosemide (LASIX) injection 20 mg  20 mg Intravenous Once Netta Cedars, MD  HYDROmorphone (DILAUDID) injection 0.5-1 mg  0.5-1 mg Intravenous Q4H PRN Haus, Leeanne R, PA   0.5 mg at 02/17/22 1706   magnesium citrate solution 1 Bottle  1 Bottle Oral Once PRN Haus, Leeanne R, PA       methocarbamol (ROBAXIN) tablet 500 mg  500 mg Oral Q6H PRN Haus, Leeanne R, PA   500 mg at 02/18/22 1450   Or   methocarbamol (ROBAXIN) 500 mg in dextrose 5 % 50 mL IVPB  500 mg Intravenous Q6H PRN Haus, Leeanne R, PA       metoprolol tartrate (LOPRESSOR) tablet 50 mg  50 mg Oral BID Rai, Ripudeep K, MD   50 mg at 02/21/22 1024   ondansetron (ZOFRAN) tablet 4 mg  4 mg Oral Q6H PRN Haus, Leeanne R,  PA       Or   ondansetron (ZOFRAN) injection 4 mg  4 mg Intravenous Q6H PRN Haus, Leeanne R, PA   4 mg at 02/20/22 1142   Oral care mouth rinse  15 mL Mouth Rinse PRN Sydnee Cabal, MD       oxyCODONE (Oxy IR/ROXICODONE) immediate release tablet 10-15 mg  10-15 mg Oral Q4H PRN Haus, Leeanne R, PA   10 mg at 02/18/22 1255   oxyCODONE (Oxy IR/ROXICODONE) immediate release tablet 5-10 mg  5-10 mg Oral Q4H PRN Haus, Leeanne R, PA   10 mg at 02/18/22 0228   pantoprazole (PROTONIX) EC tablet 40 mg  40 mg Oral Daily Haus, Leeanne R, PA   40 mg at 02/21/22 0803   PHENobarbital (LUMINAL) tablet 97.2 mg  97.2 mg Oral QHS Haus, Leeanne R, PA   97.2 mg at 02/20/22 2217   predniSONE (DELTASONE) tablet 5 mg  5 mg Oral QAC breakfast Rai, Ripudeep K, MD   5 mg at 02/21/22 0803   senna-docusate (Senokot-S) tablet 1 tablet  1 tablet Oral QHS PRN Elizabeth Sauer R, PA   1 tablet at 02/21/22 1025   sodium chloride (hypertonic) 3 % solution   Intravenous Continuous Reesa Chew, MD 30 mL/hr at 02/21/22 0657 New Bag at 02/21/22 0657   traMADol (ULTRAM) tablet 25 mg  25 mg Oral Q6H PRN Elizabeth Sauer R, PA          Review of Systems: 10 systems reviewed and negative except per interval history/subjective  Physical Exam: Vitals:   02/21/22 1000 02/21/22 1024  BP:  (!) 143/57  Pulse: 91 (!) 111  Resp: (!) 22 (!) 27  Temp:    SpO2: 92% 98%   No intake/output data recorded.  Intake/Output Summary (Last 24 hours) at 02/21/2022 1159 Last data filed at 02/21/2022 0600 Gross per 24 hour  Intake 615.62 ml  Output 600 ml  Net 15.62 ml   Constitutional: tired appearing, nad ENMT: ears and nose without scars or lesions, MMM CV: normal rate, trace edema in ble Respiratory: clear to auscultation, normal work of breathing Gastrointestinal: soft, non-tender, no palpable masses or hernias Skin: no visible lesions or rashes Psych: tired but awake and alert, judgement/insight seems impaired, appropriate mood and  affect   Test Results I personally reviewed new and old clinical labs and radiology tests Lab Results  Component Value Date   NA 120 (L) 02/21/2022   K 3.4 (L) 02/21/2022   CL 91 (L) 02/21/2022   CO2 22 02/21/2022   BUN 11 02/21/2022   CREATININE 0.54 02/21/2022   CALCIUM 8.0 (L) 02/21/2022   ALBUMIN 4.1 02/05/2022    CBC  Recent Labs  Lab 02/17/22 1510 02/20/22 1528 02/21/22 0627  WBC 13.5* 28.4* 22.1*  HGB 13.6 8.6* 7.9*  HCT 42.8 24.4* 22.2*  MCV 98.2 91.4 90.6  PLT 173 198 213

## 2022-02-22 ENCOUNTER — Inpatient Hospital Stay (HOSPITAL_COMMUNITY): Payer: Medicare Other

## 2022-02-22 DIAGNOSIS — G9341 Metabolic encephalopathy: Secondary | ICD-10-CM | POA: Diagnosis not present

## 2022-02-22 DIAGNOSIS — E871 Hypo-osmolality and hyponatremia: Secondary | ICD-10-CM | POA: Diagnosis not present

## 2022-02-22 DIAGNOSIS — I2510 Atherosclerotic heart disease of native coronary artery without angina pectoris: Secondary | ICD-10-CM | POA: Diagnosis not present

## 2022-02-22 DIAGNOSIS — M1712 Unilateral primary osteoarthritis, left knee: Secondary | ICD-10-CM | POA: Diagnosis not present

## 2022-02-22 LAB — TYPE AND SCREEN
ABO/RH(D): A POS
Antibody Screen: NEGATIVE
Unit division: 0
Unit division: 0

## 2022-02-22 LAB — CBC
HCT: 32.3 % — ABNORMAL LOW (ref 36.0–46.0)
Hemoglobin: 10.8 g/dL — ABNORMAL LOW (ref 12.0–15.0)
MCH: 31 pg (ref 26.0–34.0)
MCHC: 33.4 g/dL (ref 30.0–36.0)
MCV: 92.8 fL (ref 80.0–100.0)
Platelets: 216 10*3/uL (ref 150–400)
RBC: 3.48 MIL/uL — ABNORMAL LOW (ref 3.87–5.11)
RDW: 15 % (ref 11.5–15.5)
WBC: 25.1 10*3/uL — ABNORMAL HIGH (ref 4.0–10.5)
nRBC: 0.2 % (ref 0.0–0.2)

## 2022-02-22 LAB — SODIUM
Sodium: 126 mmol/L — ABNORMAL LOW (ref 135–145)
Sodium: 130 mmol/L — ABNORMAL LOW (ref 135–145)
Sodium: 130 mmol/L — ABNORMAL LOW (ref 135–145)

## 2022-02-22 LAB — BASIC METABOLIC PANEL
Anion gap: 9 (ref 5–15)
BUN: 25 mg/dL — ABNORMAL HIGH (ref 8–23)
CO2: 19 mmol/L — ABNORMAL LOW (ref 22–32)
Calcium: 8.2 mg/dL — ABNORMAL LOW (ref 8.9–10.3)
Chloride: 103 mmol/L (ref 98–111)
Creatinine, Ser: 1.19 mg/dL — ABNORMAL HIGH (ref 0.44–1.00)
GFR, Estimated: 43 mL/min — ABNORMAL LOW (ref >60)
Glucose, Bld: 127 mg/dL — ABNORMAL HIGH (ref 70–99)
Potassium: 4.2 mmol/L (ref 3.5–5.1)
Sodium: 131 mmol/L — ABNORMAL LOW (ref 135–145)

## 2022-02-22 LAB — BPAM RBC
Blood Product Expiration Date: 202310082359
Blood Product Expiration Date: 202310102359
ISSUE DATE / TIME: 202309172030
ISSUE DATE / TIME: 202309172345
Unit Type and Rh: 6200
Unit Type and Rh: 6200

## 2022-02-22 MED ORDER — ENOXAPARIN SODIUM 30 MG/0.3ML IJ SOSY
30.0000 mg | PREFILLED_SYRINGE | INTRAMUSCULAR | Status: DC
  Administered 2022-02-23: 30 mg via SUBCUTANEOUS
  Filled 2022-02-22: qty 0.3

## 2022-02-22 MED ORDER — METOPROLOL TARTRATE 5 MG/5ML IV SOLN
5.0000 mg | Freq: Once | INTRAVENOUS | Status: AC
  Administered 2022-02-22: 5 mg via INTRAVENOUS
  Filled 2022-02-22: qty 5

## 2022-02-22 MED ORDER — FUROSEMIDE 10 MG/ML IJ SOLN
40.0000 mg | Freq: Two times a day (BID) | INTRAMUSCULAR | Status: AC
  Administered 2022-02-22 – 2022-02-23 (×2): 40 mg via INTRAVENOUS
  Filled 2022-02-22 (×2): qty 4

## 2022-02-22 NOTE — Progress Notes (Signed)
PROGRESS NOTE  [name] YDX:[id number] DOB: 22-Jan-1930 DOA: 02/17/2022 PCP: [name], MD   LOS: 1 day   Brief Narrative / Interim history: [name] is a 88 y.o. female with past medical history of hypertension, GERD, arthritis, polymyalgia rheumatica, seizures admitted under orthopedic service for left knee pain, underwent total knee arthroplasty on 9/13.  After surgery, patient was noted to be lethargic, slow to respond by the daughters (one of them is a retired Therapist, sports). BMET done showed hyponatremia with sodium 116, last sodium 136 on 02/05/2022.  Hospitalist team was consulted.  Significant events: 9/13-admit to the hospital, total knee arthroplasty 9/16-concern for weakness, labs show sodium of 116.  TRH and nephrology consulted, moved to ICU and placed on hypertonic saline  Significant imaging / results / micro data: Chest x-ray 9/16-no active disease Blood cultures 9/16-no growth  Subjective / 24h Interval events: Daughter is at bedside.  Patient easily arousable, states that she feels well.  She denies any chest pain, denies any shortness of breath.  No palpitations.  Assesement and Plan: Principal Problem:   Osteoarthritis of left knee Active Problems:   Acute metabolic encephalopathy   Acute hyponatremia   Principal problem Hyponatremia -Last sodium checked on 9/1 was 136, no labs on admission, noted to be lethargic and slower to respond on 9/16.  Sodium 116 on bmet.  TRH as well as nephrology consulted.  She was moved to stepdown/ICU, and started on hypertonic saline.  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  Continue to hold Lexapro.  Appreciate renal follow-up  Active problems History of chronic arthritis, left knee -status post total knee arthroplasty on 9/13. Management and plan per orthopedics  Normocytic anemia, postop blood loss anemia-hemoglobin dipped as low as 7.9, received 2 units of packed red blood cells followed by  Lasix last night.  Hemoglobin improved appropriately and is 10.8 this morning  Paroxysmal Afib with RVR-Patient has a history of paroxysmal A-fib, follows Dr. Otho Perl (cardiology), was not on anticoagulation prior to admission, was doing fairly well in NSR.  Developed A-fib with RVR on 9/17, however flipped back into sinus rhythm.  Cardiology consulted.  Continue metoprolol  Hypertension-Increased metoprolol to 50 mg twice daily, continue   Polymyalgia rheumatica -Resumed prednisone 5 mg daily (outpatient dose).  Cortisol normal   History of seizures/epilepsy -Continue phenobarbital   GERD - continue PPI   Leukocytosis -Unclear etiology, was 13.5 on 9/13 but now up to 25.1. No fevers, lungs clear, no significant infectious symptoms.  Remains afebrile, closely monitor  Scheduled Meds:  Chlorhexidine Gluconate Cloth  6 each Topical Daily   enoxaparin (LOVENOX) injection  40 mg Subcutaneous Q24H   ezetimibe  10 mg Oral Daily   feeding supplement  237 mL Oral BID BM   metoprolol tartrate  50 mg Oral BID   pantoprazole  40 mg Oral Daily   PHENobarbital  97.2 mg Oral QHS   predniSONE  5 mg Oral QAC breakfast   Continuous Infusions:  methocarbamol (ROBAXIN) IV     PRN Meds:.acetaminophen, alum & mag hydroxide-simeth, bisacodyl, diphenhydrAMINE, HYDROmorphone (DILAUDID) injection, magnesium citrate, methocarbamol **OR** methocarbamol (ROBAXIN) IV, ondansetron **OR** ondansetron (ZOFRAN) IV, mouth rinse, oxyCODONE, oxyCODONE, senna-docusate, traMADol  Current Outpatient Medications  Medication Instructions   acetaminophen (TYLENOL) 1,000 mg, Oral, Every 6 hours PRN   bisacodyl (DULCOLAX) 5 mg, Oral, Daily PRN   celecoxib (CELEBREX) 200 mg, Oral, Daily   cholecalciferol (VITAMIN D3) 1,000 Units, Oral, Daily   escitalopram (LEXAPRO) 10  mg, Oral, Daily   ezetimibe (ZETIA) 10 mg, Oral, Daily   methocarbamol (ROBAXIN) 500 mg, Oral, Every 6 hours PRN   metoprolol tartrate (LOPRESSOR) 25 mg, Oral,  2 times daily   Multiple Vitamins-Minerals (MULTIVITAMIN WITH MINERALS) tablet 1 tablet, Oral, Daily   ondansetron (ZOFRAN) 4 mg, Oral, Every 6 hours PRN   oxyCODONE (OXY IR/ROXICODONE) 5-10 mg, Oral, Every 4 hours PRN   pantoprazole (PROTONIX) 40 mg, Oral, Daily   PHENobarbital (LUMINAL) 97.2 mg, Oral, Daily at bedtime   predniSONE (DELTASONE) 5 mg, Oral, Daily with breakfast   traMADol (ULTRAM) 50 mg, Oral, Every 6 hours    Diet Orders (From admission, onward)     Start     Ordered   02/20/22 2208  Diet regular Room service appropriate? Yes; Fluid consistency: Thin; Fluid restriction: 1200 mL Fluid  Diet effective now       Question Answer Comment  Room service appropriate? Yes   Fluid consistency: Thin   Fluid restriction: 1200 mL Fluid      02/20/22 2207   02/19/22 0000  Diet - low sodium heart healthy        02/19/22 0713            DVT prophylaxis: enoxaparin (LOVENOX) injection 40 mg Start: 02/18/22 0800 SCDs Start: 02/17/22 1101   Lab Results  Component Value Date   PLT 216 02/22/2022      Code Status: Full Code  Family Communication: 2 (of the 7) daughters present at bedside  Status is: Inpatient Remains inpatient appropriate because: Severity of illness   Level of care: ICU  Objective: Vitals:   02/22/22 0800 02/22/22 0824 02/22/22 0900 02/22/22 1000  BP: (!) 140/43  (!) 142/58 (!) 134/52  Pulse: 80  82 (!) 103  Resp: (!) 24  (!) 24 (!) 22  Temp:  98.2 F (36.8 C)    TempSrc:  Oral    SpO2: 94%  96% 96%  Weight:      Height:        Intake/Output Summary (Last 24 hours) at 02/22/2022 1041 Last data filed at 02/22/2022 0906 Gross per 24 hour  Intake 1585.52 ml  Output 550 ml  Net 1035.52 ml   Wt Readings from Last 3 Encounters:  02/20/22 73 kg  02/05/22 65.5 kg  12/24/21 67 kg    Examination:  Constitutional: NAD Eyes: no scleral icterus ENMT: Mucous membranes are moist.  Neck: normal, supple Respiratory: Overall clear on anterior  auscultation, no wheezing Cardiovascular: Regular rate and rhythm, no murmurs / rubs / gallops.  Abdomen: non distended, no tenderness. Bowel sounds positive.  Musculoskeletal: no clubbing / cyanosis.  Skin: no rashes Neurologic: non focal   Data Reviewed: I have independently reviewed following labs and imaging studies   CBC Recent Labs  Lab 02/17/22 1510 02/20/22 1528 02/21/22 0627 02/22/22 0325  WBC 13.5* 28.4* 22.1* 25.1*  HGB 13.6 8.6* 7.9* 10.8*  HCT 42.8 24.4* 22.2* 32.3*  PLT 173 198 213 216  MCV 98.2 91.4 90.6 92.8  MCH 31.2 32.2 32.2 31.0  MCHC 31.8 35.2 35.6 33.4  RDW 13.9 13.1 13.2 15.0    Recent Labs  Lab 02/20/22 1528 02/20/22 1955 02/20/22 2142 02/21/22 0627 02/21/22 0956 02/21/22 1052 02/21/22 1452 02/21/22 1839 02/21/22 2042 02/22/22 0325 02/22/22 0742  NA 116* 114*   < > 120*   < >  --  123* 124* 125* 126* 131*  K 3.6 3.8  --  3.4*  --   --   --  4.5  --   --  4.2  CL 83* 81*  --  91*  --   --   --  95*  --   --  103  CO2 22 21*  --  22  --   --   --  21*  --   --  19*  GLUCOSE 146* 164*  --  132*  --   --   --  137*  --   --  127*  BUN 13 12  --  11  --   --   --  14  --   --  25*  CREATININE 0.47 0.48  --  0.54  --   --   --  0.54  --   --  1.19*  CALCIUM 7.8* 7.7*  --  8.0*  --   --   --  7.8*  --   --  8.2*  PROCALCITON  --  0.16  --   --   --   --   --   --   --   --   --   TSH  --  0.907  --   --   --   --   --   --   --   --   --   BNP  --   --   --   --   --  218.1*  --   --   --   --   --    < > = values in this interval not displayed.    ------------------------------------------------------------------------------------------------------------------ No results for input(s): "CHOL", "HDL", "LDLCALC", "TRIG", "CHOLHDL", "LDLDIRECT" in the last 72 hours.  No results found for: "HGBA1C" ------------------------------------------------------------------------------------------------------------------ Recent Labs    02/20/22 1955   TSH 0.907    Cardiac Enzymes No results for input(s): "CKMB", "TROPONINI", "MYOGLOBIN" in the last 168 hours.  Invalid input(s): "CK" ------------------------------------------------------------------------------------------------------------------    Component Value Date/Time   BNP 218.1 (H) 02/21/2022 1052    CBG: Recent Labs  Lab 02/20/22 0746  GLUCAP 149*    Recent Results (from the past 240 hour(s))  Culture, blood (Routine X 2) w Reflex to ID Panel     Status: None (Preliminary result)   Collection Time: 02/20/22  7:55 PM   Specimen: BLOOD  Result Value Ref Range Status   Specimen Description   Final    BLOOD LEFT ANTECUBITAL Performed at [facility], [place] [address], [place], [place] [address]    Special Requests   Final    BOTTLES DRAWN AEROBIC AND ANAEROBIC Blood Culture adequate volume Performed at [facility] [address], [place], [place] [address]    Culture   Final    NO GROWTH 1 DAY Performed at [facility], [place] [address], [place], [place] [address]    Report Status PENDING  Incomplete  Culture, blood (Routine X 2) w Reflex to ID Panel     Status: None (Preliminary result)   Collection Time: 02/20/22  8:07 PM   Specimen: BLOOD  Result Value Ref Range Status   Specimen Description   Final    BLOOD BLOOD RIGHT HAND Performed at [facility] [address], [place], [place] [address]    Special Requests   Final    BOTTLES DRAWN AEROBIC ONLY Blood Culture results may not be optimal due to an inadequate volume of blood received in culture bottles Performed at [facility]  [address] [place], [place] [address]    Culture   Final    NO GROWTH 1 DAY Performed at [facility] [address] [place], [place] [address]    Report Status PENDING  Incomplete     Radiology Studies: ECHOCARDIOGRAM COMPLETE  Result Date: 02/21/2022    ECHOCARDIOGRAM REPORT    Patient Name:   RENESHA LIZAMA Date of Exam: 02/21/2022 Medical Rec #:  [id number]         Height:       61.0 in Accession #:    [id number]        Weight:       160.9 lb Date of Birth:  April 02, 1930         BSA:          1.722 m Patient Age:    47 years          BP:           150/62 mmHg Patient Gender: F                 HR:           67 bpm. Exam Location:  Inpatient Procedure: 2D Echo, Cardiac Doppler and Color Doppler Indications:    Atrial fibrillation  History:        Patient has no prior history of Echocardiogram examinations.                 Risk Factors:Hypertension and Dyslipidemia.  Sonographer:    Jefferey Pica Referring Phys: [id number] RIPUDEEP K RAI IMPRESSIONS  1. Left ventricular ejection fraction, by estimation, is 60 to 65%. The left ventricle has normal function. The left ventricle has no regional wall motion abnormalities. Left ventricular diastolic parameters are indeterminate. Elevated left ventricular end-diastolic pressure.  2. Right ventricular systolic function is normal. The right ventricular size is normal. There is moderately elevated pulmonary artery systolic pressure. The estimated right ventricular systolic pressure is 36.1 mmHg.  3. Left atrial size was moderately dilated.  4. The mitral valve is abnormal. Moderate mitral valve regurgitation. Moderate mitral annular calcification.  5. The aortic valve is tricuspid. There is moderate calcification of the aortic valve. Aortic valve regurgitation is not visualized. Aortic valve sclerosis/calcification is present, without any evidence of aortic stenosis.  6. The inferior vena cava is normal in size with greater than 50% respiratory variability, suggesting right atrial pressure of 3 mmHg.  7. Echodensity seen in right atrium likely free wall and trabeculation visualized out of plane. Comparison(s): No prior Echocardiogram. FINDINGS  Left Ventricle: Left ventricular ejection fraction, by estimation, is 60 to 65%. The left ventricle has  normal function. The left ventricle has no regional wall motion abnormalities. The left ventricular internal cavity size was normal in size. There is  borderline left ventricular hypertrophy. Left ventricular diastolic parameters are indeterminate. Elevated left ventricular end-diastolic pressure. Right Ventricle: The right ventricular size is normal. No increase in right ventricular wall thickness. Right ventricular systolic function is normal. There is moderately elevated pulmonary artery systolic pressure. The tricuspid regurgitant velocity is 3.54 m/s, and with an assumed right atrial pressure of 3 mmHg, the estimated right ventricular systolic pressure is 44.3 mmHg. Left Atrium: Left atrial size was moderately dilated. Right Atrium: Right atrial size was normal in size. Pericardium: There is no evidence of pericardial effusion. Presence of epicardial fat layer. Mitral Valve: The mitral valve is abnormal. There is mild calcification of the mitral valve leaflet(s). Moderate mitral annular calcification. Moderate  mitral valve regurgitation. Tricuspid Valve: The tricuspid valve is grossly normal. Tricuspid valve regurgitation is mild. Aortic Valve: The aortic valve is tricuspid. There is moderate calcification of the aortic valve. There is moderate aortic valve annular calcification. Aortic valve regurgitation is not visualized. Aortic valve sclerosis/calcification is present, without  any evidence of aortic stenosis. Aortic valve peak gradient measures 9.6 mmHg. Pulmonic Valve: The pulmonic valve was grossly normal. Pulmonic valve regurgitation is trivial. Aorta: The aortic root is normal in size and structure. Venous: The inferior vena cava is normal in size with greater than 50% respiratory variability, suggesting right atrial pressure of 3 mmHg. IAS/Shunts: No atrial level shunt detected by color flow Doppler.  LEFT VENTRICLE PLAX 2D LVIDd:         4.20 cm   Diastology LVIDs:         2.30 cm   LV e' medial:     6.47 cm/s LV PW:         1.10 cm   LV E/e' medial:  21.8 LV IVS:        1.00 cm   LV e' lateral:   8.32 cm/s LVOT diam:     1.70 cm   LV E/e' lateral: 16.9 LV SV:         54 LV SV Index:   32 LVOT Area:     2.27 cm  RIGHT VENTRICLE          IVC RV Basal diam:  2.70 cm  IVC diam: 1.80 cm TAPSE (M-mode): 1.9 cm LEFT ATRIUM             Index        RIGHT ATRIUM           Index LA diam:        4.50 cm 2.61 cm/m   RA Area:     14.50 cm LA Vol (A2C):   83.1 ml 48.25 ml/m  RA Volume:   37.20 ml  21.60 ml/m LA Vol (A4C):   64.3 ml 37.34 ml/m LA Biplane Vol: 73.7 ml 42.80 ml/m  AORTIC VALVE                 PULMONIC VALVE AV Area (Vmax): 1.78 cm     PV Vmax:       1.04 m/s AV Vmax:        155.00 cm/s  PV Peak grad:  4.3 mmHg AV Peak Grad:   9.6 mmHg LVOT Vmax:      121.50 cm/s LVOT Vmean:     72.300 cm/s LVOT VTI:       0.239 m  AORTA Ao Root diam: 2.90 cm Ao Asc diam:  3.50 cm MITRAL VALVE                TRICUSPID VALVE MV Area (PHT): 6.43 cm     TR Peak grad:   50.1 mmHg MV Decel Time: 118 msec     TR Vmax:        354.00 cm/s MV E velocity: 141.00 cm/s MV A velocity: 62.70 cm/s   SHUNTS MV E/A ratio:  2.25         Systemic VTI:  0.24 m                             Systemic Diam: 1.70 cm Rozann Lesches MD Electronically signed by Rozann Lesches MD Signature Date/Time: 02/21/2022/2:54:18 PM    Final  Marzetta Board, MD, PhD [facility]  Between 7 am - 7 pm I am available, please contact me via Amion (for emergencies) or Securechat (non urgent messages)  Between 7 pm - 7 am I am not available, please contact night coverage MD/APP via Amion

## 2022-02-22 NOTE — Evaluation (Signed)
Occupational Therapy Evaluation Patient Details Name: [name] MRN: [id number] DOB: 25-Sep-1929 Today's Date: 02/22/2022   History of Present Illness Patient is a 88 y.o. female s/p L-TKA on 02/17/22. patient was noted to trasnfer to sepdown unit with crital value for sodium. PMH significant for seizures, HTN, GERD, breast cancer (2013), OA, polymyalgia rheumatica, R-TKA 2021, afib, HLD, RA,   Clinical Impression   Patient is a 88 year old female who was admitted for above. Patient was noted to require +2 physical assist for bed mobility and attempted standing with increased pain and lethargy. Patient is max A to TD for ADL tasks at bed level on this date. Patient was noted to have decreased functional activity tolerance, decreased endurance, decreased standing balance, decreased safety awareness, and decreased knowledge of AD/AE impacting participation in ADLs.  Patient would continue to benefit from skilled OT services at this time while admitted and after d/c to address noted deficits in order to improve overall safety and independence in ADLs.        Recommendations for follow up therapy are one component of a multi-disciplinary discharge planning process, led by the attending physician.  Recommendations may be updated based on patient status, additional functional criteria and insurance authorization.   Follow Up Recommendations  Skilled nursing-short term rehab (<3 hours/day)    Assistance Recommended at Discharge Frequent or constant Supervision/Assistance  Patient can return home with the following Two people to help with walking and/or transfers;Two people to help with bathing/dressing/bathroom;Direct supervision/assist for medications management;Help with stairs or ramp for entrance;Assist for transportation;Direct supervision/assist for financial management;Assistance with cooking/housework    Functional Status Assessment  Patient has had a recent decline in their functional  status and demonstrates the ability to make significant improvements in function in a reasonable and predictable amount of time.  Equipment Recommendations  Other (comment) (defer to next venue)    Recommendations for Other Services       Precautions / Restrictions Precautions Precautions: Fall Precaution Comments: no pillow under knee, watch BP Restrictions Weight Bearing Restrictions: No LLE Weight Bearing: Weight bearing as tolerated      Mobility Bed Mobility Overal bed mobility: Needs Assistance       Supine to sit: Max assist, +2 for safety/equipment, +2 for physical assistance Sit to supine: Max assist, +2 for safety/equipment, +2 for physical assistance   General bed mobility comments: patient participated in flat spin to sit on edge of bed with bed pad. patient noted to need total assist to scoot to edge of bed.    Transfers                          Balance Overall balance assessment: Needs assistance Sitting-balance support: Single extremity supported, Bilateral upper extremity supported, Feet supported Sitting balance-Leahy Scale: Poor   Postural control: Posterior lean, Right lateral lean Standing balance support: Reliant on assistive device for balance, During functional activity, Bilateral upper extremity supported Standing balance-Leahy Scale: Zero Standing balance comment: unable to transition into upright standing                           ADL either performed or assessed with clinical judgement   ADL Overall ADL's : Needs assistance/impaired Eating/Feeding: Moderate assistance;Bed level   Grooming: Bed level;Moderate assistance   Upper Body Bathing: Bed level;Moderate assistance   Lower Body Bathing: Bed level;Total assistance   Upper Body Dressing : Bed level;Moderate assistance  Lower Body Dressing: Bed level;Total assistance     Toilet Transfer Details (indicate cue type and reason): patient was +2 to attempt to  stand at [facility] with patient needing encouragemetn from family to participate in standing attempt while linens were changed from under patient. patient was able to partially stand with knee blocked. patient was noted to have increased pain with standing attempt in LLE. nurse present in room and aware. Toileting- Clothing Manipulation and Hygiene: Bed level;Total assistance Toileting - Clothing Manipulation Details (indicate cue type and reason): rolling to each side to change linens and hygiene tasks after standing attempt             Vision Baseline Vision/History: 1 Wears glasses Vision Assessment?: No apparent visual deficits     Perception     Praxis      Pertinent Vitals/Pain Pain Assessment Pain Assessment: Faces Faces Pain Scale: Hurts whole lot Pain Location: L knee with attempted standing Pain Descriptors / Indicators: Discomfort, Grimacing, Guarding Pain Intervention(s): Limited activity within patient's tolerance, Monitored during session, Patient requesting pain meds-RN notified     Hand Dominance Right   Extremity/Trunk Assessment Upper Extremity Assessment Upper Extremity Assessment: Generalized weakness (diffiult to fully assess with leve of lethargy at this time. patient was able to grasp bilaterally with force behind grasping. noted to have bursing on BUE with patient noted to have bleeding around IV on R forearm)   Lower Extremity Assessment Lower Extremity Assessment: Defer to PT evaluation (significant brusing on LLE)   Cervical / Trunk Assessment Cervical / Trunk Assessment: Kyphotic   Communication Communication Communication: No difficulties   Cognition Arousal/Alertness: Lethargic Behavior During Therapy: Flat affect Overall Cognitive Status: Impaired/Different from baseline Area of Impairment: Following commands, Attention                       Following Commands: Follows one step commands with increased time, Follows multi-step commands  inconsistently       General Comments: patient was sleepy with increased cues to remain awake. patients family present in room able to provide PLOF.     General Comments  blood pressure was 155/48 mmhg supine in bed. patients blood pressure was 138/57 mmhg sititng EOB with patient reporting fatigue and then later prior to standing reporting dizziness. unable to get BP in attempted standing.    Exercises     Shoulder Instructions      Home Living Family/patient expects to be discharged to:: Private residence Living Arrangements: Alone Available Help at Discharge: Family;Available 24 hours/day Type of Home: House Home Access: Ramped entrance     Home Layout: One level     Bathroom Shower/Tub: Occupational psychologist: Standard Bathroom Accessibility: Yes   Home Equipment: Conservation officer, nature (2 wheels);Rollator (4 wheels);Cane - single point;BSC/3in1;Shower seat;Transport chair          Prior Functioning/Environment Prior Level of Function : Independent/Modified Independent             Mobility Comments: SPC for community mobility, rollator at home ADLs Comments: IND        OT Problem List: Decreased activity tolerance;Decreased coordination;Decreased knowledge of use of DME or AE;Decreased safety awareness;Cardiopulmonary status limiting activity;Decreased knowledge of precautions;Impaired balance (sitting and/or standing);Decreased strength;Pain      OT Treatment/Interventions: Self-care/ADL training;Therapeutic exercise;Neuromuscular education;Energy conservation;DME and/or AE instruction;Therapeutic activities;Balance training;Patient/family education    OT Goals(Current goals can be found in the care plan section) Acute Rehab OT Goals Patient Stated Goal: to  get back in bed OT Goal Formulation: With patient/family Time For Goal Achievement: 03/08/22 Potential to Achieve Goals: Fair  OT Frequency: Min 2X/week    Co-evaluation PT/OT/SLP  Co-Evaluation/Treatment: Yes Reason for Co-Treatment: For patient/therapist safety PT goals addressed during session: Mobility/safety with mobility OT goals addressed during session: ADL's and self-care      AM-PAC OT "6 Clicks" Daily Activity     Outcome Measure Help from another person eating meals?: A Little Help from another person taking care of personal grooming?: A Lot Help from another person toileting, which includes using toliet, bedpan, or urinal?: Total Help from another person bathing (including washing, rinsing, drying)?: Total Help from another person to put on and taking off regular upper body clothing?: A Lot Help from another person to put on and taking off regular lower body clothing?: A Lot 6 Click Score: 11   End of Session Equipment Utilized During Treatment: Gait belt;Rolling walker (2 wheels) Nurse Communication: Other (comment) (nurse present in room during session)  Activity Tolerance: Patient limited by pain Patient left: in bed;with call bell/phone within reach;with family/visitor present;with nursing/sitter in room  OT Visit Diagnosis: Unsteadiness on feet (R26.81);Other abnormalities of gait and mobility (R26.89);Pain Pain - Right/Left: Left Pain - part of body: Knee                Time: 1470-9295 OT Time Calculation (min): 23 min Charges:  OT General Charges $OT Visit: 1 Visit OT Evaluation $OT Eval Moderate Complexity: 1 Mod  Alayla Dethlefs OTR/L, MS Acute Rehabilitation Department Office# [phone]   Marcellina Millin 02/22/2022, 4:43 PM

## 2022-02-22 NOTE — Progress Notes (Addendum)
Noted rate control obtained as rate is now 110's to 120 still A-FIB post IV Lopressor. B/P 131/71 currently and patient remains with  out c/o of any discomfort at this time. Pt states " can you let my bed back down ,because I am ready to take my nap". On going assessment

## 2022-02-22 NOTE — Progress Notes (Signed)
Noted lab at bedside obtaining Na level. Pt resting but is easily aroused and noted to be appropriate. Verified with LAB that Na levels are q4 and verified the time they will return for blood draw

## 2022-02-22 NOTE — Progress Notes (Signed)
NEW #20 PLACED IN RIGHT AC. SITE WATCHED ATTACHED. 35 SALINE PLACED AT THIS SITE. ON GOING ASSESSMENT. INTERNAL MEDICINE PHYSICIAN ROUNDED AT THIS TIME. NO NEW ORDERS OBTAINED

## 2022-02-22 NOTE — Consult Note (Signed)
Cardiology Consultation   Patient ID: [name] MRN: [id number]; DOB: 08-30-1929  Admit date: 02/17/2022 Date of Consult: 02/22/2022  PCP:  [name], MD   [facility] Cardiologist:  None   (followed by [facility])    Patient Profile:   [name] is a 88 y.o. female with a hx of paroxysmal atrial fibrillation, Hypertension, moderate mitral valve regurgitation, hyperlipidemia, osteoarthritis, polymyalgia rheumatica, history of seizures/epilepsy who is being seen 02/22/2022 for the evaluation of paroxysmal atrial fibrillation at the request of Dr [name].  History of Present Illness:   [name] a 88 year old female with above medical history who is followed by [facility] cardiology.  Per chart review, patient was admitted to the hospital in 07/2021 with new onset of atrial fibrillation.  She was started on medications for rate control, and she returned to normal sinus rhythm admission.  Echocardiogram on 07/29/2021 showed EF 65-70%, mild LVH with grade 3 diastolic dysfunction, normal RV systolic function, mild dilation of the left atrium, moderate mitral valve regurgitation, mild to moderate tricuspid valve regurgitation, mild to moderate pulmonary hypertension.  After discharge, she wore a cardiac monitor for 7 days that did not show any recurrence of atrial fibrillation.  Patient was not started on anticoagulation at that time as there was no recurrence of A-fib.  Patient was seen by her primary cardiologist on 08/13/2021 for preoperative risk evaluation prior to knee replacement surgery.  Underwent a nuclear stress test on 10/22/2021 that was negative for ischemia.  Patient presented on 9/13 for a left total knee arthroplasty.  After surgery, patient was noted to have become lethargic and was found to have symptomatic hyponatremia with sodium 116 on 9/16.  TRH and nephrology were consulted, started patient on a gentle  infusion of normal saline and transferred patient to ICU.  On 9/17, patient was noted to have an episode of atrial fibrillation with heart rates 1 10-1 16.  She was given an IV dose of Cardizem and her metoprolol was increased to 50 mg twice daily.  Considered starting amiodarone, however patient converted back to normal sinus rhythm so amiodarone was not started.  Audiology was asked to consult  Echocardiogram on 02/21/2022 showed EF 60-65%, elevated LVEDP, normal RV systolic function, moderately elevated pulmonary artery systolic pressure, moderate mitral troll valve regurgitation.  On interview, patient denies any chest pain, palpitations, sob. Daughter at bedside reports that the patient is typically very independent at home, and she has been noticeably more tired/sleepy than usual.   Past Medical History:  Diagnosis Date   Anemia    Anxiety    Arthritis    Atrial fibrillation ([place])    Cancer ([place])    breast Ca. on 2013   Chronic back pain    Depression    Epilepsy ([place])    GERD (gastroesophageal reflux disease)    History of kidney stones    Hyperlipidemia    Hypertension    Macular degeneration    Mitral regurgitation    Pneumonia    Polymyalgia rheumatica (HCC)    Rheumatoid arthritis ([place])    Seizures ([place])     Past Surgical History:  Procedure Laterality Date   ABDOMINAL HYSTERECTOMY     BREAST SURGERY Right    Mastectomy   CARPAL TUNNEL RELEASE Left    TONSILLECTOMY     TOTAL KNEE ARTHROPLASTY Right 01/04/2020   Procedure: TOTAL KNEE ARTHROPLASTY;  Surgeon: Sydnee Cabal, MD;  Location: [facility];  Service: Orthopedics;  Laterality: Right;  adductor canal   TOTAL KNEE ARTHROPLASTY Left 02/17/2022   Procedure: TOTAL KNEE ARTHROPLASTY;  Surgeon: Sydnee Cabal, MD;  Location: [facility];  Service: Orthopedics;  Laterality: Left;  adductor canal block 120     Home Medications:  Prior to Admission medications   Medication Sig Start Date End Date Taking? Authorizing  Provider  acetaminophen (TYLENOL) 500 MG tablet Take 2 tablets (1,000 mg total) by mouth every 6 (six) hours as needed. 07/09/18  Yes Charlesetta Shanks, MD  celecoxib (CELEBREX) 200 MG capsule Take 200 mg by mouth daily. 07/12/21  Yes [provider]  cholecalciferol (VITAMIN D3) 25 MCG (1000 UNIT) tablet Take 1,000 Units by mouth daily.   Yes [provider]  escitalopram (LEXAPRO) 10 MG tablet Take 10 mg by mouth daily.   Yes [provider]  ezetimibe (ZETIA) 10 MG tablet Take 10 mg by mouth daily.   Yes [provider]  metoprolol tartrate (LOPRESSOR) 25 MG tablet Take 25 mg by mouth 2 (two) times daily. 01/06/22  Yes [provider]  Multiple Vitamins-Minerals (MULTIVITAMIN WITH MINERALS) tablet Take 1 tablet by mouth daily.   Yes [provider]  pantoprazole (PROTONIX) 40 MG tablet Take 40 mg by mouth daily.  01/14/14  Yes [provider]  PHENobarbital (LUMINAL) 97.2 MG tablet Take 97.2 mg by mouth at bedtime.  05/17/16  Yes [provider]  predniSONE (DELTASONE) 5 MG tablet Take 5 mg by mouth daily with breakfast.  02/22/18  Yes [provider]  bisacodyl (DULCOLAX) 5 MG EC tablet Take 1 tablet (5 mg total) by mouth daily as needed for moderate constipation. 02/19/22   Drue Novel, PA  methocarbamol (ROBAXIN) 500 MG tablet Take 1 tablet (500 mg total) by mouth every 6 (six) hours as needed for muscle spasms. 02/19/22   Haus, Cordelia Pen, PA  ondansetron (ZOFRAN) 4 MG tablet Take 1 tablet (4 mg total) by mouth every 6 (six) hours as needed for nausea. 02/19/22   Drue Novel, PA  oxyCODONE (OXY IR/ROXICODONE) 5 MG immediate release tablet Take 1-2 tablets (5-10 mg total) by mouth every 4 (four) hours as needed for moderate pain or severe pain (pain score 4-6). 02/19/22   Haus, Cordelia Pen, PA  traMADol (ULTRAM) 50 MG tablet Take 1 tablet (50 mg total) by mouth every 6 (six) hours. 02/19/22   Drue Novel, PA     Inpatient Medications: Scheduled Meds:  Chlorhexidine Gluconate Cloth  6 each Topical Daily   enoxaparin (LOVENOX) injection  40 mg Subcutaneous Q24H   ezetimibe  10 mg Oral Daily   feeding supplement  237 mL Oral BID BM   metoprolol tartrate  50 mg Oral BID   pantoprazole  40 mg Oral Daily   PHENobarbital  97.2 mg Oral QHS   predniSONE  5 mg Oral QAC breakfast   Continuous Infusions:  methocarbamol (ROBAXIN) IV     PRN Meds: acetaminophen, alum & mag hydroxide-simeth, bisacodyl, diphenhydrAMINE, HYDROmorphone (DILAUDID) injection, magnesium citrate, methocarbamol **OR** methocarbamol (ROBAXIN) IV, ondansetron **OR** ondansetron (ZOFRAN) IV, mouth rinse, oxyCODONE, oxyCODONE, senna-docusate, traMADol  Allergies:    Allergies  Allergen Reactions   Sulfa Antibiotics Nausea Only   Penicillins Swelling    Tolerated Cephalosporin 01/04/20      Simvastatin Swelling    Social History:   Social History   Socioeconomic History   Marital status: Widowed    Spouse name: Not on file   Number of  children: Not on file   Years of education: Not on file   Highest education level: Not on file  Occupational History   Not on file  Tobacco Use   Smoking status: Never   Smokeless tobacco: Never  Vaping Use   Vaping Use: Never used  Substance and Sexual Activity   Alcohol use: Never   Drug use: Never   Sexual activity: Not on file  Other Topics Concern   Not on file  Social History Narrative   Not on file   Social Determinants of Health   Financial Resource Strain: Not on file  Food Insecurity: No Food Insecurity (02/17/2022)   Hunger Vital Sign    Worried About Running Out of Food in the Last Year: Never true    Ran Out of Food in the Last Year: Never true  Transportation Needs: No Transportation Needs (02/17/2022)   PRAPARE - Hydrologist (Medical): No    Lack of Transportation (Non-Medical): No  Physical Activity: Not on file  Stress: Not  on file  Social Connections: Not on file  Intimate Partner Violence: Not At Risk (02/17/2022)   Humiliation, Afraid, Rape, and Kick questionnaire    Fear of Current or Ex-Partner: No    Emotionally Abused: No    Physically Abused: No    Sexually Abused: No    Family History:   History reviewed. No pertinent family history.   ROS:  Please see the history of present illness.   All other ROS reviewed and negative.     Physical Exam/Data:   Vitals:   02/22/22 0700 02/22/22 0800 02/22/22 0824 02/22/22 0900  BP: (!) 144/77 (!) 140/43  (!) 142/58  Pulse: 64 80  82  Resp: 18 (!) 24  (!) 24  Temp:   98.2 F (36.8 C)   TempSrc:   Oral   SpO2: 96% 94%  96%  Weight:      Height:        Intake/Output Summary (Last 24 hours) at 02/22/2022 1000 Last data filed at 02/22/2022 0906 Gross per 24 hour  Intake 1585.52 ml  Output 550 ml  Net 1035.52 ml      02/20/2022    6:29 PM 02/17/2022    2:30 PM 02/05/2022    2:02 PM  Last 3 Weights  Weight (lbs) 160 lb 15 oz 145 lb 144 lb 6.4 oz  Weight (kg) 73 kg 65.772 kg 65.499 kg     Body mass index is 30.41 kg/m.  General:  Well nourished, well developed, in no acute distress. Laying flat in the bed, asleep but aroused easily to voice  HEENT: normal Neck: no JVD Vascular: Radial pulses 2+ bilaterally Cardiac:  normal S1, S2; RRR; faint systolic murmur at apex  Lungs:  mild crackles in bilateral lung bases, otherwise clear to ausculation bilaterally  Abd: soft, nontender, no hepatomegaly  Ext: S/p left total knee arthropasty, bandage intact  Skin: warm and dry  Neuro:  CNs 2-12 intact, no focal abnormalities noted Psych:  Normal affect   EKG:  The EKG was personally reviewed and demonstrates:  Sinus rhythm with marked sinus arrhythmia and PAC, nonspecific ST/T wave changes  Telemetry:  Telemetry was personally reviewed and demonstrates:  Normal Sinus Rhythm, HR in the 80s-100s   Relevant CV Studies:  Echocardiogram 02/21/2022 1. Left  ventricular ejection fraction, by estimation, is 60 to 65%. The  left ventricle has normal function. The left ventricle has no regional  wall  motion abnormalities. Left ventricular diastolic parameters are  indeterminate. Elevated left ventricular  end-diastolic pressure.   2. Right ventricular systolic function is normal. The right ventricular  size is normal. There is moderately elevated pulmonary artery systolic  pressure. The estimated right ventricular systolic pressure is 40.1 mmHg.   3. Left atrial size was moderately dilated.   4. The mitral valve is abnormal. Moderate mitral valve regurgitation.  Moderate mitral annular calcification.   5. The aortic valve is tricuspid. There is moderate calcification of the  aortic valve. Aortic valve regurgitation is not visualized. Aortic valve  sclerosis/calcification is present, without any evidence of aortic  stenosis.   6. The inferior vena cava is normal in size with greater than 50%  respiratory variability, suggesting right atrial pressure of 3 mmHg.   7. Echodensity seen in right atrium likely free wall and trabeculation  visualized out of plane.   Comparison(s): No prior Echocardiogram.   Laboratory Data:  High Sensitivity Troponin:  No results for input(s): "TROPONINIHS" in the last 720 hours.   Chemistry Recent Labs  Lab 02/21/22 0627 02/21/22 0956 02/21/22 1839 02/21/22 2042 02/22/22 0325 02/22/22 0742  NA 120*   < > 124* 125* 126* 131*  K 3.4*  --  4.5  --   --  4.2  CL 91*  --  95*  --   --  103  CO2 22  --  21*  --   --  19*  GLUCOSE 132*  --  137*  --   --  127*  BUN 11  --  14  --   --  25*  CREATININE 0.54  --  0.54  --   --  1.19*  CALCIUM 8.0*  --  7.8*  --   --  8.2*  GFRNONAA >60  --  >60  --   --  43*  ANIONGAP 7  --  8  --   --  9   < > = values in this interval not displayed.    No results for input(s): "PROT", "ALBUMIN", "AST", "ALT", "ALKPHOS", "BILITOT" in the last 168 hours. Lipids No results for  input(s): "CHOL", "TRIG", "HDL", "LABVLDL", "LDLCALC", "CHOLHDL" in the last 168 hours.  Hematology Recent Labs  Lab 02/20/22 1528 02/21/22 0627 02/22/22 0325  WBC 28.4* 22.1* 25.1*  RBC 2.67* 2.45* 3.48*  HGB 8.6* 7.9* 10.8*  HCT 24.4* 22.2* 32.3*  MCV 91.4 90.6 92.8  MCH 32.2 32.2 31.0  MCHC 35.2 35.6 33.4  RDW 13.1 13.2 15.0  PLT 198 213 216   Thyroid  Recent Labs  Lab 02/20/22 1955  TSH 0.907    BNP Recent Labs  Lab 02/21/22 1052  BNP 218.1*    DDimer No results for input(s): "DDIMER" in the last 168 hours.   Radiology/Studies:  ECHOCARDIOGRAM COMPLETE  Result Date: 02/21/2022    ECHOCARDIOGRAM REPORT   Patient Name:   [name] Date of Exam: 02/21/2022 Medical Rec #:  [id number]         Height:       61.0 in Accession #:    [id number]        Weight:       160.9 lb Date of Birth:  1930/04/03         BSA:          1.722 m Patient Age:    18 years          BP:  150/62 mmHg Patient Gender: F                 HR:           67 bpm. Exam Location:  Inpatient Procedure: 2D Echo, Cardiac Doppler and Color Doppler Indications:    Atrial fibrillation  History:        Patient has no prior history of Echocardiogram examinations.                 Risk Factors:Hypertension and Dyslipidemia.  Sonographer:    Jefferey Pica Referring Phys: [id number] RIPUDEEP K RAI IMPRESSIONS  1. Left ventricular ejection fraction, by estimation, is 60 to 65%. The left ventricle has normal function. The left ventricle has no regional wall motion abnormalities. Left ventricular diastolic parameters are indeterminate. Elevated left ventricular end-diastolic pressure.  2. Right ventricular systolic function is normal. The right ventricular size is normal. There is moderately elevated pulmonary artery systolic pressure. The estimated right ventricular systolic pressure is 85.2 mmHg.  3. Left atrial size was moderately dilated.  4. The mitral valve is abnormal. Moderate mitral valve regurgitation.  Moderate mitral annular calcification.  5. The aortic valve is tricuspid. There is moderate calcification of the aortic valve. Aortic valve regurgitation is not visualized. Aortic valve sclerosis/calcification is present, without any evidence of aortic stenosis.  6. The inferior vena cava is normal in size with greater than 50% respiratory variability, suggesting right atrial pressure of 3 mmHg.  7. Echodensity seen in right atrium likely free wall and trabeculation visualized out of plane. Comparison(s): No prior Echocardiogram. FINDINGS  Left Ventricle: Left ventricular ejection fraction, by estimation, is 60 to 65%. The left ventricle has normal function. The left ventricle has no regional wall motion abnormalities. The left ventricular internal cavity size was normal in size. There is  borderline left ventricular hypertrophy. Left ventricular diastolic parameters are indeterminate. Elevated left ventricular end-diastolic pressure. Right Ventricle: The right ventricular size is normal. No increase in right ventricular wall thickness. Right ventricular systolic function is normal. There is moderately elevated pulmonary artery systolic pressure. The tricuspid regurgitant velocity is 3.54 m/s, and with an assumed right atrial pressure of 3 mmHg, the estimated right ventricular systolic pressure is 77.8 mmHg. Left Atrium: Left atrial size was moderately dilated. Right Atrium: Right atrial size was normal in size. Pericardium: There is no evidence of pericardial effusion. Presence of epicardial fat layer. Mitral Valve: The mitral valve is abnormal. There is mild calcification of the mitral valve leaflet(s). Moderate mitral annular calcification. Moderate mitral valve regurgitation. Tricuspid Valve: The tricuspid valve is grossly normal. Tricuspid valve regurgitation is mild. Aortic Valve: The aortic valve is tricuspid. There is moderate calcification of the aortic valve. There is moderate aortic valve annular  calcification. Aortic valve regurgitation is not visualized. Aortic valve sclerosis/calcification is present, without  any evidence of aortic stenosis. Aortic valve peak gradient measures 9.6 mmHg. Pulmonic Valve: The pulmonic valve was grossly normal. Pulmonic valve regurgitation is trivial. Aorta: The aortic root is normal in size and structure. Venous: The inferior vena cava is normal in size with greater than 50% respiratory variability, suggesting right atrial pressure of 3 mmHg. IAS/Shunts: No atrial level shunt detected by color flow Doppler.  LEFT VENTRICLE PLAX 2D LVIDd:         4.20 cm   Diastology LVIDs:         2.30 cm   LV e' medial:    6.47 cm/s LV PW:  1.10 cm   LV E/e' medial:  21.8 LV IVS:        1.00 cm   LV e' lateral:   8.32 cm/s LVOT diam:     1.70 cm   LV E/e' lateral: 16.9 LV SV:         54 LV SV Index:   32 LVOT Area:     2.27 cm  RIGHT VENTRICLE          IVC RV Basal diam:  2.70 cm  IVC diam: 1.80 cm TAPSE (M-mode): 1.9 cm LEFT ATRIUM             Index        RIGHT ATRIUM           Index LA diam:        4.50 cm 2.61 cm/m   RA Area:     14.50 cm LA Vol (A2C):   83.1 ml 48.25 ml/m  RA Volume:   37.20 ml  21.60 ml/m LA Vol (A4C):   64.3 ml 37.34 ml/m LA Biplane Vol: 73.7 ml 42.80 ml/m  AORTIC VALVE                 PULMONIC VALVE AV Area (Vmax): 1.78 cm     PV Vmax:       1.04 m/s AV Vmax:        155.00 cm/s  PV Peak grad:  4.3 mmHg AV Peak Grad:   9.6 mmHg LVOT Vmax:      121.50 cm/s LVOT Vmean:     72.300 cm/s LVOT VTI:       0.239 m  AORTA Ao Root diam: 2.90 cm Ao Asc diam:  3.50 cm MITRAL VALVE                TRICUSPID VALVE MV Area (PHT): 6.43 cm     TR Peak grad:   50.1 mmHg MV Decel Time: 118 msec     TR Vmax:        354.00 cm/s MV E velocity: 141.00 cm/s MV A velocity: 62.70 cm/s   SHUNTS MV E/A ratio:  2.25         Systemic VTI:  0.24 m                             Systemic Diam: 1.70 cm Rozann Lesches MD Electronically signed by Rozann Lesches MD Signature Date/Time:  02/21/2022/2:54:18 PM    Final    DG CHEST PORT 1 VIEW  Result Date: 02/20/2022 CLINICAL DATA:  Shortness of breath. EXAM: PORTABLE CHEST 1 VIEW COMPARISON:  Chest x-ray dated July 29, 2021. CT chest dated May 15, 2021. FINDINGS: The heart size and mediastinal contours are within normal limits. Normal pulmonary vascularity. Minimal left and mild right linear atelectasis/scarring in both mid to lower lungs. No focal consolidation, pleural effusion, or pneumothorax. No acute osseous abnormality. IMPRESSION: 1. No active disease. Electronically Signed   By: Titus Dubin M.D.   On: 02/20/2022 09:48     Assessment and Plan:   Paroxysmal atrial fibrillation - Patient was noted to have had an episode of afib yesterday with HR in the 110s. Given 1 dose of IV diltizem, converted to NSR  - Per telemetry, patient has been maintaining sinus rhythm - Continue metoprolol tartrate 50 mg BID   - CHADS-VASc 4 (agex2, gender, HTN)  - I am hesitant to start [facility] today as patient has been  anemic this admission (hemoglobin was 7.9 yesterday) and received 2 units PRBC yesterday. Hemoglobin did respond well to transfusion and increased to 10.8 today. If hemoglobin remains stable overnight, can consider starting DOAC tomorrow  - Echocardiogram yesterday showed EF 60-65%, elevated LVEDP, normal RV systolic function, moderately dilated LA,moderate mitral valve regurgitation   Normocytic anemia - Likely blood loss anemia postoperatively  - Hemoglobin was 7.9 yesterday, improved to 10.8 today after patient received 2 units PRBC   Symptomatic Hyponatremia  - Defer to Nephrology and IM - Na improved to 131 today  - Note that patient has been receiving IV fluids and is currently net +3.5 L fluids since admission. LVEDP was elevated on echo.  - She was given a dose of IV lasix 20 mg overnight after blood transfusion. Closely follow her volume status. Currently she has mild crackles in her lung bases, but denies any  SOB. May need additional doses of lasix, but will defer to nephrology given her hyponatremia with ongoing work up   Hypertension - Continue metoprolol tartrate 50 mg BID   Otherwise, per primary -Polymyalgia rheumatica -History of seizures/epilepsy -History of chronic arthritis in left knee, s/p total knee arthroplasty  Risk Assessment/Risk Scores:      CHA2DS2-VASc Score = 4   This indicates a 4.8% annual risk of stroke. The patient's score is based upon: CHF History: 0 HTN History: 1 Diabetes History: 0 Stroke History: 0 Vascular Disease History: 0 Age Score: 2 Gender Score: 1    For questions or updates, please contact [place] Please consult [email or web address] for contact info under    Signed, Margie Billet, PA-C  02/22/2022 10:00 AM

## 2022-02-22 NOTE — Progress Notes (Signed)
Physical Therapy Treatment Patient Details Name: [name] MRN: [id number] DOB: 1930-04-09 Today's Date: 02/22/2022   History of Present Illness Patient is a 88 y.o. female s/p L-TKA on 02/17/22. patient was noted to trasnfer to sepdown unit with crital value for sodium. PMH significant for seizures, HTN, GERD, breast cancer (2013), OA, polymyalgia rheumatica, R-TKA 2021, afib, HLD, RA,    PT Comments    Pt seen for second session POD5 with goal of OOB mobility. Pt remained very sleepy and required frequent re-arousals via voice and touch, reporting "I wish you would just let me be." Pt required max assist +2 for bed mobility, total assist +2 for standing with increased pain report (8/10), pt unable to achieve full upright standing. BP in supine 155/48, sitting EOB 138/57, unsafe to take in standing. Returned pt to supine and completed linen change with bilateral rolls, pt requiring min assist to roll. Recommendation has been updated to SNF-level therapies upon discharge, family is agreeable. We will continue to follow acutely.    Recommendations for follow up therapy are one component of a multi-disciplinary discharge planning process, led by the attending physician.  Recommendations may be updated based on patient status, additional functional criteria and insurance authorization.  Follow Up Recommendations  Skilled nursing-short term rehab (<3 hours/day) (Family agreeable) Can patient physically be transported by private vehicle: No   Assistance Recommended at Discharge Frequent or constant Supervision/Assistance  Patient can return home with the following Two people to help with walking and/or transfers;Two people to help with bathing/dressing/bathroom;Assistance with cooking/housework;Direct supervision/assist for medications management;Direct supervision/assist for financial management;Assist for transportation;Help with stairs or ramp for entrance   Equipment Recommendations  None  recommended by PT    Recommendations for Other Services       Precautions / Restrictions Precautions Precautions: Fall Precaution Comments: no pillow under knee, watch BP Restrictions Weight Bearing Restrictions: No LLE Weight Bearing: Weight bearing as tolerated     Mobility  Bed Mobility Overal bed mobility: Needs Assistance Bed Mobility: Supine to Sit, Sit to Supine, Rolling Rolling: Min assist   Supine to sit: Max assist, +2 for safety/equipment, +2 for physical assistance Sit to supine: Max assist, +2 for safety/equipment, +2 for physical assistance   General bed mobility comments: Patient participated in flat spin to sit on edge of bed with bed pad; pt required assist to scoot to edge of bed via bed pad    Transfers Overall transfer level: Needs assistance Equipment used: Rolling walker (2 wheels) Transfers: Sit to/from Stand, Bed to chair/wheelchair/BSC Sit to Stand: +2 physical assistance, +2 safety/equipment, Total assist, From elevated surface           General transfer comment: Pt required total assist +2 for physical assist to come into partially standing position with hips off bed, pt reporting pain during transfer, RN and family helped to change linens during partial stand, pt required total assist +2 for returning to sitting EOB. Further mobility deferred.    Ambulation/Gait                   Stairs             Wheelchair Mobility    Modified Rankin (Stroke Patients Only)       Balance Overall balance assessment: Needs assistance Sitting-balance support: Single extremity supported, Bilateral upper extremity supported, Feet supported Sitting balance-Leahy Scale: Poor Sitting balance - Comments: constant cues with varying level of assist mod to close supervision Postural control: Posterior lean, Right  lateral lean Standing balance support: Reliant on assistive device for balance, During functional activity, Bilateral upper extremity  supported Standing balance-Leahy Scale: Zero Standing balance comment: unable to transition into upright standing                            Cognition Arousal/Alertness: Lethargic Behavior During Therapy: Flat affect Overall Cognitive Status: Impaired/Different from baseline Area of Impairment: Following commands, Attention                   Current Attention Level: Sustained   Following Commands: Follows one step commands with increased time, Follows multi-step commands inconsistently       General Comments: patient was sleepy with increased cues to remain awake. patients family present in room able to provide PLOF.        Exercises      General Comments General comments (skin integrity, edema, etc.): BP n supine prior to activity 155/48. BP in sitting 138/57. Standing BP not attempted, unsafe due to pt's total dependency. Pt's two daughters and granddaughter and RN present for session.      Pertinent Vitals/Pain Pain Assessment Pain Assessment: Faces Faces Pain Scale: Hurts whole lot Pain Location: L knee with attempted standing Pain Descriptors / Indicators: Discomfort, Grimacing, Guarding Pain Intervention(s): Limited activity within patient's tolerance, Monitored during session, Repositioned    Home Living Family/patient expects to be discharged to:: Private residence Living Arrangements: Alone Available Help at Discharge: Family;Available 24 hours/day Type of Home: House Home Access: Ramped entrance       Home Layout: One level Home Equipment: Conservation officer, nature (2 wheels);Rollator (4 wheels);Cane - single point;BSC/3in1;Shower seat;Transport chair      Prior Function            PT Goals (current goals can now be found in the care plan section) Acute Rehab PT Goals Patient Stated Goal: Walking better PT Goal Formulation: With patient Time For Goal Achievement: 02/24/22 Potential to Achieve Goals: Good Progress towards PT goals: Not  progressing toward goals - comment (pt unable to stand)    Frequency    7X/week      PT Plan Discharge plan needs to be updated    Co-evaluation PT/OT/SLP Co-Evaluation/Treatment: Yes Reason for Co-Treatment: For patient/therapist safety PT goals addressed during session: Mobility/safety with mobility OT goals addressed during session: ADL's and self-care      AM-PAC PT "6 Clicks" Mobility   Outcome Measure  Help needed turning from your back to your side while in a flat bed without using bedrails?: Total Help needed moving from lying on your back to sitting on the side of a flat bed without using bedrails?: Total Help needed moving to and from a bed to a chair (including a wheelchair)?: Total Help needed standing up from a chair using your arms (e.g., wheelchair or bedside chair)?: Total Help needed to walk in hospital room?: Total Help needed climbing 3-5 steps with a railing? : Total 6 Click Score: 6    End of Session Equipment Utilized During Treatment: Gait belt Activity Tolerance: Patient limited by fatigue;Patient limited by pain Patient left: with call bell/phone within reach;with family/visitor present;in bed;with bed alarm set;with SCD's reapplied;with nursing/sitter in room Nurse Communication: Mobility status PT Visit Diagnosis: Pain;Difficulty in walking, not elsewhere classified (R26.2) Pain - Right/Left: Left Pain - part of body: Knee     Time: 3267-1245 PT Time Calculation (min) (ACUTE ONLY): 23 min  Charges:  Coolidge Breeze, PT, DPT [facility] Office: [phone]   Coolidge Breeze 02/22/2022, 6:22 PM

## 2022-02-22 NOTE — Progress Notes (Signed)
Subjective: 5 Days Post-Op Procedure(s) (LRB): TOTAL KNEE ARTHROPLASTY (Left) Patient seen in rounds for Dr. [name] Patient reports pain as mild.   Patients daughters who are bedside this am report that she is doing better this am than she has been. She appears to be more alert. She is very tired this am but was able to respond to my questions.    Objective: Vital signs in last 24 hours: Temp:  [97.4 F (36.3 C)-98.6 F (37 C)] 97.4 F (36.3 C) (09/18 0300) Pulse Rate:  [64-118] 64 (09/18 0700) Resp:  [16-31] 18 (09/18 0700) BP: (113-174)/(43-80) 144/77 (09/18 0700) SpO2:  [82 %-100 %] 96 % (09/18 0700)  Intake/Output from previous day: 09/17 0701 - 09/18 0700 In: 1341.2 [I.V.:673.4; Blood:667.8] Out: 500 [Urine:500] Intake/Output this shift: Total I/O In: 178.5 [I.V.:178.5] Out: -   Recent Labs    02/20/22 1528 02/21/22 0627 02/22/22 0325  HGB 8.6* 7.9* 10.8*   Recent Labs    02/21/22 0627 02/22/22 0325  WBC 22.1* 25.1*  RBC 2.45* 3.48*  HCT 22.2* 32.3*  PLT 213 216   Recent Labs    02/21/22 0627 02/21/22 0956 02/21/22 1839 02/21/22 2042 02/22/22 0325  NA 120*   < > 124* 125* 126*  K 3.4*  --  4.5  --   --   CL 91*  --  95*  --   --   CO2 22  --  21*  --   --   BUN 11  --  14  --   --   CREATININE 0.54  --  0.54  --   --   GLUCOSE 132*  --  137*  --   --   CALCIUM 8.0*  --  7.8*  --   --    < > = values in this interval not displayed.   No results for input(s): "LABPT", "INR" in the last 72 hours.  Neurovascular intact Sensation intact distally Intact pulses distally Dorsiflexion/Plantar flexion intact Incision: dressing C/D/I Compartment soft   Assessment/Plan: 5 Days Post-Op Procedure(s) (LRB): TOTAL KNEE ARTHROPLASTY (Left) Up with therapy I do want them to do what they can with her at this time Keep bandage on and intact  Hyponatremia:  Being followed by IM and Nephrology Labs do seem to be trending in correct direction  Postop  anemia 2 units packed RBC was transfused yesterday  Blood work is trending up Hospitalist team is following    Drue Novel, PA-C Dr. Sydnee Cabal [facility] [phone] 02/22/2022, 7:32 AM

## 2022-02-22 NOTE — Plan of Care (Signed)
Plan of care reviewed with the patient and daughter and clinical understanding noted. All questions asked and answered Problem: Education: Goal: Knowledge of General Education information will improve Description: Including pain rating scale, medication(s)/side effects and non-pharmacologic comfort measures Outcome: Progressing   Problem: Education: Goal: Knowledge of the prescribed therapeutic regimen will improve Outcome: Progressing Goal: Individualized Educational Video(s) Outcome: Progressing   Problem: Activity: Goal: Range of joint motion will improve Outcome: Progressing   Problem: Education: Goal: Individualized Educational Video(s) Outcome: Progressing

## 2022-02-22 NOTE — Progress Notes (Addendum)
Physical Therapy Treatment Patient Details Name: [name] MRN: [id number] DOB: 05-05-1930 Today's Date: 02/22/2022   History of Present Illness Patient is a 88 y.o. female s/p L-TKA on 02/17/22 with PMH significant for seizures, HTN, GERD, breast cancer (2013), OA, polymyalgia rheumatica, R-TKA 2021, afib, HLD, RA,    PT Comments    Pt seen POD5, presenting very sleepy but arousable, reporting no pain, just "I can't seem to wake up," Aox4. BP 140/60 at start of session, HR 109. Provided cool washcloth to wipe face to attempt to increase arousal, Pt able to complete without assistance. Pt completed bed-level HEP with min assist for several of the exercises, asked pt to complete a second time today oustide of therapy session and to complete ankle pumps every hour. Reinforced no pillow under the knee, pt verbalized understanding. We will attempt second session later today for OOB mobility. We will continue to follow acutely.   Recommendations for follow up therapy are one component of a multi-disciplinary discharge planning process, led by the attending physician.  Recommendations may be updated based on patient status, additional functional criteria and insurance authorization.  Follow Up Recommendations  Other (comment) (may need SNF--family wants to take pt home)     Assistance Recommended at Discharge Frequent or constant Supervision/Assistance  Patient can return home with the following Two people to help with walking and/or transfers;Two people to help with bathing/dressing/bathroom;Assistance with cooking/housework;Direct supervision/assist for medications management;Direct supervision/assist for financial management;Assist for transportation;Help with stairs or ramp for entrance   Equipment Recommendations  None recommended by PT    Recommendations for Other Services       Precautions / Restrictions Precautions Precautions: Fall Precaution Comments: no pillow under knee,  watch BP Restrictions Weight Bearing Restrictions: No LLE Weight Bearing: Weight bearing as tolerated     Mobility  Bed Mobility               General bed mobility comments: Focus of session was bed level HEP    Transfers                   General transfer comment: Focus of session was bed level HEP    Ambulation/Gait               General Gait Details: Focus of session was bed level HEP   Stairs             Wheelchair Mobility    Modified Rankin (Stroke Patients Only)       Balance Overall balance assessment: Needs assistance Sitting-balance support: Single extremity supported, Bilateral upper extremity supported, Feet supported Sitting balance-Leahy Scale: Poor Sitting balance - Comments: constant cues with varying level of assist mod to close supervision Postural control: Posterior lean, Right lateral lean Standing balance support: Reliant on assistive device for balance, During functional activity, Bilateral upper extremity supported Standing balance-Leahy Scale: Zero                              Cognition Arousal/Alertness: Awake/alert Behavior During Therapy: Flat affect, WFL for tasks assessed/performed Overall Cognitive Status: Impaired/Different from baseline Area of Impairment: Following commands, Attention                   Current Attention Level: Sustained   Following Commands: Follows one step commands with increased time, Follows multi-step commands inconsistently       General Comments: intermittently sleepy/groggy. arouses to voice  Exercises Total Joint Exercises Ankle Circles/Pumps: AROM, 20 reps, Supine, Both, Limitations Ankle Circles/Pumps Limitations: weaker pf/df on L Quad Sets: AROM, Both, Supine, 20 reps Short Arc Quad: AROM, Left, 10 reps, Supine Heel Slides: AAROM, Left, 15 reps, Supine Hip ABduction/ADduction: AROM, Left, 20 reps, Supine Straight Leg Raises: AAROM, Left,  10 reps, Supine    General Comments        Pertinent Vitals/Pain Pain Assessment Pain Assessment: Faces Faces Pain Scale: Hurts a little bit Pain Location: left knee with activity Pain Descriptors / Indicators: Discomfort, Grimacing, Guarding Pain Intervention(s): Limited activity within patient's tolerance, Monitored during session, Repositioned    Home Living                          Prior Function            PT Goals (current goals can now be found in the care plan section) Acute Rehab PT Goals Patient Stated Goal: Walking better PT Goal Formulation: With patient Time For Goal Achievement: 02/24/22 Potential to Achieve Goals: Good Progress towards PT goals: Progressing toward goals    Frequency    7X/week      PT Plan Current plan remains appropriate    Co-evaluation              AM-PAC PT "6 Clicks" Mobility   Outcome Measure  Help needed turning from your back to your side while in a flat bed without using bedrails?: Total Help needed moving from lying on your back to sitting on the side of a flat bed without using bedrails?: Total Help needed moving to and from a bed to a chair (including a wheelchair)?: Total Help needed standing up from a chair using your arms (e.g., wheelchair or bedside chair)?: Total Help needed to walk in hospital room?: Total Help needed climbing 3-5 steps with a railing? : Total 6 Click Score: 6    End of Session   Activity Tolerance: Patient limited by fatigue Patient left: with call bell/phone within reach;with family/visitor present;in bed;with bed alarm set;with SCD's reapplied Nurse Communication: Mobility status PT Visit Diagnosis: Pain;Difficulty in walking, not elsewhere classified (R26.2) Pain - Right/Left: Left Pain - part of body: Knee     Time: 0814-4818 PT Time Calculation (min) (ACUTE ONLY): 19 min  Charges:  $Therapeutic Exercise: 8-22 mins                     Coolidge Breeze, PT,  DPT [facility] Office: [phone]   Coolidge Breeze 02/22/2022, 10:58 AM

## 2022-02-22 NOTE — Progress Notes (Addendum)
[facility] Progress Note  Subjective: seem in room, no c/o's  Vitals:   02/22/22 1047 02/22/22 1100 02/22/22 1200 02/22/22 1300  BP: (!) 146/60 (!) 161/74 120/61 (!) 144/56  Pulse: 84 (!) 111 (!) 112 65  Resp:  (!) 29 (!) 27 (!) 26  Temp:   97.6 F (36.4 C)   TempSrc:   Oral   SpO2:  96% 98% 97%  Weight:      Height:        Exam: Gen alert, no distress Sclera anicteric, throat clear  No jvd or bruits Chest clear bilat to bases RRR no MRG Abd soft ntnd no mass or ascites +bs Ext 1-2+ bilat L > R hip edema, min pretib edema Neuro is alert, Ox 3 , nf    Home meds - celecoxib, escitalopram, ezetimibe, metoprolol 25 bid, MVI, pantoprazole, phenobarbital, prednisone 5 am, vits/ supps/ prns     UNa 64, UOsm 520 (02/20/22)  BP's 140- 75, HR 60-80, afeb  Na 131  KI+ 4.2  CO2 19  BUN 25  Creat 1.19  Assessment/ Plan: Severe symptomatic hyponatremia - possibly acute but no labs for comparison so we opted to treat as chronic. Pt was confused on admission w/ Na= 116, nadir 114 on 9/16. 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 Urine studies supported SIADH. Stopped home SSRI and started 3% saline on 9/16 w/ improved Na+ up to 120-125 yesterday and up to 130 this am. 3% Na stopped this am. MS better, Ox 3. Pt edematous in dependent areas on exam and wts are up 7kg.  - will lower Na+ checks to q 6hrs - get CXR, if clear start salt tabs, and if wet start IV lasix  Hypertension - continue metoprolol Leukocytosis -  WBC down yest and up slightly today, low grade fever resolving.  Total knee arthroplasty - Ortho following Hypokalemia - resolved     [name] 02/22/2022, 1:58 PM   Recent Labs  Lab 02/21/22 0627 02/21/22 1839 02/22/22 0325 02/22/22 0742  HGB 7.9*  --  10.8*  --   CALCIUM 8.0* 7.8*  --  8.2*  CREATININE 0.54 0.54  --  1.19*  K 3.4* 4.5  --  4.2   No results for input(s): "IRON", "TIBC", "FERRITIN" in the last  168 hours. Inpatient medications:  Chlorhexidine Gluconate Cloth  6 each Topical Daily   [START ON 02/23/2022] enoxaparin (LOVENOX) injection  30 mg Subcutaneous Q24H   ezetimibe  10 mg Oral Daily   feeding supplement  237 mL Oral BID BM   metoprolol tartrate  50 mg Oral BID   pantoprazole  40 mg Oral Daily   PHENobarbital  97.2 mg Oral QHS   predniSONE  5 mg Oral QAC breakfast    methocarbamol (ROBAXIN) IV     acetaminophen, alum & mag hydroxide-simeth, bisacodyl, diphenhydrAMINE, HYDROmorphone (DILAUDID) injection, magnesium citrate, methocarbamol **OR** methocarbamol (ROBAXIN) IV, ondansetron **OR** ondansetron (ZOFRAN) IV, mouth rinse, oxyCODONE, oxyCODONE, senna-docusate, traMADol

## 2022-02-22 NOTE — Progress Notes (Signed)
Noted report obtained and care assumed of patient. PT NEURO: Appropriate, follows all commands and answers all questions correctly. Currently denies any discomfort. Noted IV site in right arm small bore with 3% saline infusing '@30'$  cc/hr with guard monitor slightly distal to insertion site without noted complication. Large bore in left FA. Noted pt to get blood transfusion and sites assessed to start new IV, noted to be unsuccessful. Blood noted to be ready to be given. On going assessment

## 2022-02-22 NOTE — Progress Notes (Signed)
Noted pt heart rate and rhythm converted to A-FIB with RVR.RATE 120-170. No associated S/S. Pt denies SOB ,C/P discomfort noted in surgical leg. 12 LEAD OBTAINED, PTS PO LOPRESSOR given and pain med for surgical site. Hospitalist notified of patient current condition. Awaiting orders.  Vss with B/P OF 134/71. Pt continues to deny discomfort

## 2022-02-22 NOTE — Plan of Care (Signed)
Plan of care reviewed with the daughter over the phone. Noted pt was drowsy and it was difficult to maintain education and plan of care with patient directly. Family expressed an understanding and in agreement of plan of care. Problem: Education: Goal: Knowledge of General Education information will improve Description: Including pain rating scale, medication(s)/side effects and non-pharmacologic comfort measures Outcome: Progressing   Problem: Education: Goal: Knowledge of General Education information will improve Description: Including pain rating scale, medication(s)/side effects and non-pharmacologic comfort measures Outcome: Progressing

## 2022-02-22 NOTE — Progress Notes (Signed)
Noted Na values of125 noted. Pending orders established pending lab values.

## 2022-02-22 NOTE — Progress Notes (Signed)
Transfusions started see blood administration forms.

## 2022-02-23 ENCOUNTER — Inpatient Hospital Stay (HOSPITAL_COMMUNITY): Payer: Medicare Other

## 2022-02-23 DIAGNOSIS — N179 Acute kidney failure, unspecified: Secondary | ICD-10-CM

## 2022-02-23 DIAGNOSIS — J9601 Acute respiratory failure with hypoxia: Secondary | ICD-10-CM | POA: Diagnosis not present

## 2022-02-23 DIAGNOSIS — I480 Paroxysmal atrial fibrillation: Secondary | ICD-10-CM

## 2022-02-23 DIAGNOSIS — E871 Hypo-osmolality and hyponatremia: Secondary | ICD-10-CM | POA: Diagnosis not present

## 2022-02-23 DIAGNOSIS — G9341 Metabolic encephalopathy: Secondary | ICD-10-CM | POA: Diagnosis not present

## 2022-02-23 DIAGNOSIS — I959 Hypotension, unspecified: Secondary | ICD-10-CM

## 2022-02-23 DIAGNOSIS — Z66 Do not resuscitate: Secondary | ICD-10-CM

## 2022-02-23 DIAGNOSIS — M1712 Unilateral primary osteoarthritis, left knee: Secondary | ICD-10-CM | POA: Diagnosis not present

## 2022-02-23 DIAGNOSIS — J189 Pneumonia, unspecified organism: Secondary | ICD-10-CM

## 2022-02-23 DIAGNOSIS — M353 Polymyalgia rheumatica: Secondary | ICD-10-CM

## 2022-02-23 DIAGNOSIS — I2510 Atherosclerotic heart disease of native coronary artery without angina pectoris: Secondary | ICD-10-CM | POA: Diagnosis not present

## 2022-02-23 LAB — CBC
HCT: 31.3 % — ABNORMAL LOW (ref 36.0–46.0)
Hemoglobin: 9.9 g/dL — ABNORMAL LOW (ref 12.0–15.0)
MCH: 31 pg (ref 26.0–34.0)
MCHC: 31.6 g/dL (ref 30.0–36.0)
MCV: 98.1 fL (ref 80.0–100.0)
Platelets: 222 10*3/uL (ref 150–400)
RBC: 3.19 MIL/uL — ABNORMAL LOW (ref 3.87–5.11)
RDW: 15.9 % — ABNORMAL HIGH (ref 11.5–15.5)
WBC: 28.2 10*3/uL — ABNORMAL HIGH (ref 4.0–10.5)
nRBC: 0.1 % (ref 0.0–0.2)

## 2022-02-23 LAB — BASIC METABOLIC PANEL
Anion gap: 11 (ref 5–15)
BUN: 40 mg/dL — ABNORMAL HIGH (ref 8–23)
CO2: 19 mmol/L — ABNORMAL LOW (ref 22–32)
Calcium: 8.4 mg/dL — ABNORMAL LOW (ref 8.9–10.3)
Chloride: 101 mmol/L (ref 98–111)
Creatinine, Ser: 1.91 mg/dL — ABNORMAL HIGH (ref 0.44–1.00)
GFR, Estimated: 25 mL/min — ABNORMAL LOW (ref >60)
Glucose, Bld: 119 mg/dL — ABNORMAL HIGH (ref 70–99)
Potassium: 4.3 mmol/L (ref 3.5–5.1)
Sodium: 131 mmol/L — ABNORMAL LOW (ref 135–145)

## 2022-02-23 LAB — SODIUM, URINE, RANDOM: Sodium, Ur: 83 mmol/L

## 2022-02-23 LAB — URINALYSIS, ROUTINE W REFLEX MICROSCOPIC
Bacteria, UA: NONE SEEN
Bilirubin Urine: NEGATIVE
Glucose, UA: 50 mg/dL — AB
Ketones, ur: NEGATIVE mg/dL
Nitrite: NEGATIVE
Protein, ur: NEGATIVE mg/dL
RBC / HPF: >50 RBC/hpf — ABNORMAL HIGH (ref 0–5)
Specific Gravity, Urine: 1.011 (ref 1.005–1.030)
pH: 6 (ref 5.0–8.0)

## 2022-02-23 LAB — HEPATIC FUNCTION PANEL
ALT: 22 U/L (ref 0–44)
AST: 26 U/L (ref 15–41)
Albumin: 2.7 g/dL — ABNORMAL LOW (ref 3.5–5.0)
Alkaline Phosphatase: 73 U/L (ref 38–126)
Bilirubin, Direct: 0.6 mg/dL — ABNORMAL HIGH (ref 0.0–0.2)
Indirect Bilirubin: 1 mg/dL — ABNORMAL HIGH (ref 0.3–0.9)
Total Bilirubin: 1.6 mg/dL — ABNORMAL HIGH (ref 0.3–1.2)
Total Protein: 5.6 g/dL — ABNORMAL LOW (ref 6.5–8.1)

## 2022-02-23 LAB — CREATININE, URINE, RANDOM: Creatinine, Urine: 73 mg/dL

## 2022-02-23 LAB — HEPARIN LEVEL (UNFRACTIONATED): Heparin Unfractionated: 0.51 IU/mL (ref 0.30–0.70)

## 2022-02-23 LAB — BRAIN NATRIURETIC PEPTIDE: B Natriuretic Peptide: 339.3 pg/mL — ABNORMAL HIGH (ref 0.0–100.0)

## 2022-02-23 LAB — MRSA NEXT GEN BY PCR, NASAL: MRSA by PCR Next Gen: NOT DETECTED

## 2022-02-23 LAB — TSH: TSH: 2.009 u[IU]/mL (ref 0.350–4.500)

## 2022-02-23 LAB — MAGNESIUM: Magnesium: 2.2 mg/dL (ref 1.7–2.4)

## 2022-02-23 LAB — CORTISOL: Cortisol, Plasma: 17.4 ug/dL

## 2022-02-23 LAB — SODIUM: Sodium: 128 mmol/L — ABNORMAL LOW (ref 135–145)

## 2022-02-23 LAB — OSMOLALITY, URINE: Osmolality, Ur: 541 mOsm/kg (ref 300–900)

## 2022-02-23 MED ORDER — SODIUM CHLORIDE 0.9 % IV BOLUS
250.0000 mL | Freq: Once | INTRAVENOUS | Status: AC
  Administered 2022-02-23: 250 mL via INTRAVENOUS

## 2022-02-23 MED ORDER — SODIUM CHLORIDE 0.9 % IV SOLN
2.0000 g | INTRAVENOUS | Status: DC
  Administered 2022-02-23 – 2022-02-24 (×2): 2 g via INTRAVENOUS
  Filled 2022-02-23 (×2): qty 12.5

## 2022-02-23 MED ORDER — VANCOMYCIN HCL 1500 MG/300ML IV SOLN
1500.0000 mg | Freq: Once | INTRAVENOUS | Status: AC
  Administered 2022-02-23: 1500 mg via INTRAVENOUS
  Filled 2022-02-23: qty 300

## 2022-02-23 MED ORDER — AMIODARONE HCL IN DEXTROSE 360-4.14 MG/200ML-% IV SOLN
30.0000 mg/h | INTRAVENOUS | Status: DC
  Filled 2022-02-23: qty 200

## 2022-02-23 MED ORDER — VANCOMYCIN HCL 750 MG/150ML IV SOLN: 750.0000 mg | INTRAVENOUS | Status: DC

## 2022-02-23 MED ORDER — AMIODARONE HCL IN DEXTROSE 360-4.14 MG/200ML-% IV SOLN
60.0000 mg/h | INTRAVENOUS | Status: AC
  Administered 2022-02-23 (×2): 60 mg/h via INTRAVENOUS
  Filled 2022-02-23: qty 200

## 2022-02-23 MED ORDER — SODIUM CHLORIDE 0.9 % IV SOLN: 250.0000 mL | INTRAVENOUS | Status: DC

## 2022-02-23 MED ORDER — HEPARIN (PORCINE) 25000 UT/250ML-% IV SOLN
950.0000 [IU]/h | INTRAVENOUS | Status: DC
  Administered 2022-02-23 – 2022-03-02 (×8): 1050 [IU]/h via INTRAVENOUS
  Filled 2022-02-23 (×8): qty 250

## 2022-02-23 MED ORDER — HEPARIN BOLUS VIA INFUSION
4000.0000 [IU] | Freq: Once | INTRAVENOUS | Status: DC
  Filled 2022-02-23: qty 4000

## 2022-02-23 MED ORDER — SODIUM CHLORIDE 0.9 % IV SOLN: INTRAVENOUS | Status: DC

## 2022-02-23 MED ORDER — METHYLPREDNISOLONE SODIUM SUCC 40 MG IJ SOLR
40.0000 mg | Freq: Two times a day (BID) | INTRAMUSCULAR | Status: DC
  Administered 2022-02-23 (×2): 40 mg via INTRAVENOUS
  Filled 2022-02-23 (×3): qty 1

## 2022-02-23 MED ORDER — METRONIDAZOLE 500 MG/100ML IV SOLN
500.0000 mg | Freq: Two times a day (BID) | INTRAVENOUS | Status: DC
  Administered 2022-02-23 (×2): 500 mg via INTRAVENOUS
  Filled 2022-02-23 (×4): qty 100

## 2022-02-23 MED ORDER — MIDODRINE HCL 5 MG PO TABS
10.0000 mg | ORAL_TABLET | Freq: Three times a day (TID) | ORAL | Status: DC
  Administered 2022-02-23 – 2022-02-24 (×3): 10 mg via ORAL
  Filled 2022-02-23 (×4): qty 2

## 2022-02-23 MED ORDER — AMIODARONE LOAD VIA INFUSION
150.0000 mg | Freq: Once | INTRAVENOUS | Status: AC
  Administered 2022-02-23: 150 mg via INTRAVENOUS
  Filled 2022-02-23: qty 83.34

## 2022-02-23 MED ORDER — NOREPINEPHRINE 4 MG/250ML-% IV SOLN
2.0000 ug/min | INTRAVENOUS | Status: DC
  Administered 2022-02-23: 2 ug/min via INTRAVENOUS
  Filled 2022-02-23: qty 250

## 2022-02-23 NOTE — Progress Notes (Signed)
PT Cancellation Note  Patient Details Name: [name] MRN: [id number] DOB: 1929-06-22   Cancelled Treatment:    Reason Eval/Treat Not Completed: Medical issues which prohibited therapy, Per RN,noted  no ROM , KI applied to  Left knee. Will hold PT until further notice that patient is stable to resume activity. [place] Office [phone] Weekend pager-[phone]    Claretha Cooper 02/23/2022, 4:35 PM

## 2022-02-23 NOTE — Progress Notes (Signed)
Orthopedic Tech Progress Note Patient Details:  [name] 1929/07/20 [id number]  Patient ID: [name], female   DOB: 02-07-1930, 88 y.o.   MRN: [id number]  [name] 02/23/2022, 2:04 PM Left knee immobilizer applied

## 2022-02-23 NOTE — Progress Notes (Signed)
ANTICOAGULATION CONSULT NOTE - Initial Consult  Pharmacy Consult for Heparin + Vanco/Cefepime Indication:  Afib and r/o PE + Sepsis/PNA  Allergies  Allergen Reactions   Sulfa Antibiotics Nausea Only   Penicillins Swelling    Tolerated Cephalosporin 01/04/20      Simvastatin Swelling    Patient Measurements: Height: '5\' 1"'$  (154.9 cm) Weight: 74.9 kg (165 lb 2 oz) IBW/kg (Calculated) : 47.8 Heparin Dosing Weight: 65 kg  Vital Signs: Temp: 98.3 F (36.8 C) (09/19 0745) Temp Source: Oral (09/19 0745) BP: 90/35 (09/19 1010) Pulse Rate: 107 (09/19 1010)  Labs: Recent Labs    02/21/22 0627 02/21/22 1839 02/22/22 0325 02/22/22 0742 02/23/22 0339 02/23/22 0929  HGB 7.9*  --  10.8*  --   --  9.9*  HCT 22.2*  --  32.3*  --   --  31.3*  PLT 213  --  216  --   --  222  CREATININE 0.54 0.54  --  1.19* 1.91*  --     Estimated Creatinine Clearance: 18.1 mL/min (A) (by C-G formula based on SCr of 1.91 mg/dL (H)).   Medical History: Past Medical History:  Diagnosis Date   Anemia    Anxiety    Arthritis    Atrial fibrillation (HCC)    Cancer (HCC)    breast Ca. on 2013   Chronic back pain    Depression    Epilepsy ([place])    GERD (gastroesophageal reflux disease)    History of kidney stones    Hyperlipidemia    Hypertension    Macular degeneration    Mitral regurgitation    Pneumonia    Polymyalgia rheumatica (HCC)    Rheumatoid arthritis ([place])    Seizures ([place])     Assessment: Active Problem(s): 9/13: s/p TKR   PMH: hypertension, GERD, arthritis, polymyalgia rheumatica, seizures   AC/Heme: ABLA post op> ortho rec 2 U PRBC 9/17 (hgb 7.9) d/t lethargy. Post-op Afib with CHADS2VASC 4.  - 9/19: LMWH '30mg'$  received. Hgb 10.8>9.9 today watch closely. Plts WNL. Pt with AKI not the best time to start DOAC yet. Start IV heparin for afib. Need to r/o PE per cardiology recommendations.  ID: r/o sepsis, Xray shows new infiltrate v atelectasis.  - Tmax 99.9. WBC 25.1.(on  prednisone)  9/19 cefepime>> 9/19 flagyl>>  9/16: BC x 2>>  Goal of Therapy:  Heparin level 0.3-0.7 units/ml Monitor platelets by anticoagulation protocol: Yes   Plan:  IV heparin NO BOLUS since she received LMWH this AM. Heparin infusion 1050 units/hr (16 units/kg/hr) with post-op anemia Check heparin level in 6-8hrs Daily HL and CBC  Vanco '1500mg'$  IV x 1 Vancomycin 750 mg IV Q 48 hrs. Goal AUC 400-550. Expected AUC: 534 SCr used: 1.94  Cefepime 2g IV q24h  F/u rising Scr and UOP    Cova Knieriem S. Alford Highland, PharmD, BCPS Clinical Staff Pharmacist [email or web address]  Alford Highland, Jaaron Oleson Stillinger 02/23/2022,10:32 AM

## 2022-02-23 NOTE — Progress Notes (Signed)
Bilateral lower extremity venous duplex has been completed. Preliminary results can be found in CV Proc through chart review.  Results were given to the patient's nurse, Shanon Brow.  02/23/22 4:22 PM [name] RVT

## 2022-02-23 NOTE — Progress Notes (Signed)
Noted A-fib with a controlled rate of 110. NOTED rr 24-26 with O2 sats low 90's  as pt c/o of leg pain. Lasix 40 mg given early with pain meds. O2 via  at 2l started

## 2022-02-23 NOTE — Progress Notes (Signed)
ANTICOAGULATION CONSULT NOTE  Pharmacy Consult for Heparin + Vanco/Cefepime Indication:  Afib and r/o PE + Sepsis/PNA  Allergies  Allergen Reactions   Sulfa Antibiotics Nausea Only   Penicillins Swelling    Tolerated Cephalosporin 01/04/20      Simvastatin Swelling    Patient Measurements: Height: '5\' 1"'$  (154.9 cm) Weight: 74.9 kg (165 lb 2 oz) IBW/kg (Calculated) : 47.8 Heparin Dosing Weight: 65 kg  Vital Signs: Temp: 97.4 F (36.3 C) (09/19 2000) Temp Source: Oral (09/19 2000) BP: 134/33 (09/19 1900) Pulse Rate: 69 (09/19 1900)  Labs: Recent Labs    02/21/22 0627 02/21/22 1839 02/22/22 0325 02/22/22 0742 02/23/22 0339 02/23/22 0929 02/23/22 1930  HGB 7.9*  --  10.8*  --   --  9.9*  --   HCT 22.2*  --  32.3*  --   --  31.3*  --   PLT 213  --  216  --   --  222  --   HEPARINUNFRC  --   --   --   --   --   --  0.51  CREATININE 0.54 0.54  --  1.19* 1.91*  --   --      Estimated Creatinine Clearance: 18.1 mL/min (A) (by C-G formula based on SCr of 1.91 mg/dL (H)).  Assessment: Active Problem(s): 9/13: s/p TKR   PMH: hypertension, GERD, arthritis, polymyalgia rheumatica, seizures   Assessment 73 yoF with PMH PAF not on anticoag, who experienced AFib/RVR after TKA. Also with hypoxia likely d/t PNA, but CCM would like to rule out PE as well. Pharmacy consulted to dose IV heparin.  Today, 02/23/2022: Hgb low but stable; Plt stable WNL Heparin level therapeutic on 1050 units/hr SCr acutely elevated No bleeding or infusion issues per RN  Goal of Therapy:  Heparin level 0.3-0.7 units/ml Monitor platelets by anticoagulation protocol: Yes   Plan:  Continue heparin at 1050 units/hr Daily HL and CBC F/u plans for long-term anticoagulation   Reuel Boom, PharmD, BCPS [phone] 02/23/2022, 8:46 PM

## 2022-02-23 NOTE — Progress Notes (Addendum)
[facility] Progress Note  Subjective: seem in room, Na+ 131 this am. Got IV lasix yest w/ not impressive diuresis. BP's dropped this am into 70's, got some NS boluses and up to 90s now.   Vitals:   02/23/22 0905 02/23/22 0930 02/23/22 1000 02/23/22 1010  BP: (!) 91/55 (!) 92/53 (!) 73/45 (!) 90/35  Pulse: (!) 119 (!) 124 (!) 116 (!) 107  Resp: (!) 29 (!) '25 19 19  '$ Temp:      TempSrc:      SpO2: (!) 86% 96% 94% 94%  Weight:      Height:        Exam: Gen alert, no distress Sclera anicteric, throat clear  No jvd or bruits Chest clear bilat to bases RRR no MRG Abd soft ntnd no mass or ascites +bs Ext 1-2+ bilat L > R hip edema, min pretib edema Neuro is alert, Ox 3 , nf    Home meds - celecoxib, escitalopram, ezetimibe, metoprolol 25 bid, MVI, pantoprazole, phenobarbital, prednisone 5 am, vits/ supps/ prns     UNa 64, UOsm 520 (02/20/22)  BP's 140- 75, HR 60-80, afeb  Na 131  KI+ 4.2  CO2 19  BUN 25  Creat 1.19  Assessment/ Plan: Hyponatremia - severe on admission, possibly acute but no labs for comparison so we opted to treat as chronic. Pt was confused on admission w/ Na= 116, nadir 114 on 9/16. 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 Urine studies supported SIADH. Stopped home SSRI and started 3% saline on 9/16 w/ improved Na+ up to 130 after about 48 hrs. 3% Na stopped 9/18. Pt now edematous w/ wt's up 7kg. Started IV lasix, but BP's dropped this am so lasix dc'd and pt rec'd NS bolus. Na+ stable at 131 today. No further diuretics, will get albumin and consider IV albumin. Start NS at 50 cc/hr (BNP not high). Repeat urine lytes and check TSH and cortisol. Will follow.  AKI - due to hypotension most likely, 3rd spacing possibly. Will get urine Na/Cr and renal US. Creat up to 1.9 today (0.8 at baseline on admission). Get albumin and consider IV alb.  Hypotension - BB and cardizem on hold, NS bolus given and Bp's up to 90s.  Atrial  fib - amio, metoprolol and cardizem now on hold as above Leukocytosis -  WBC down yest and up slightly today, low grade fever resolving.  SP L TKA - ortho following  Rob Rozena Fierro 02/23/2022, 11:30 AM   Recent Labs  Lab 02/22/22 0325 02/22/22 0742 02/23/22 0339 02/23/22 0929  HGB 10.8*  --   --  9.9*  CALCIUM  --  8.2* 8.4*  --   CREATININE  --  1.19* 1.91*  --   K  --  4.2 4.3  --     No results for input(s): "IRON", "TIBC", "FERRITIN" in the last 168 hours. Inpatient medications:  Chlorhexidine Gluconate Cloth  6 each Topical Daily   ezetimibe  10 mg Oral Daily   feeding supplement  237 mL Oral BID BM   methylPREDNISolone (SOLU-MEDROL) injection  40 mg Intravenous Q12H   midodrine  10 mg Oral TID WC   pantoprazole  40 mg Oral Daily   PHENobarbital  97.2 mg Oral QHS    amiodarone 60 mg/hr (02/23/22 1051)   Followed by   amiodarone     ceFEPime (MAXIPIME) IV Stopped (02/23/22 0845)   heparin     methocarbamol (ROBAXIN) IV  metronidazole Stopped (02/23/22 0950)   vancomycin     [START ON 02/25/2022] vancomycin     acetaminophen, alum & mag hydroxide-simeth, bisacodyl, diphenhydrAMINE, HYDROmorphone (DILAUDID) injection, magnesium citrate, methocarbamol **OR** methocarbamol (ROBAXIN) IV, ondansetron **OR** ondansetron (ZOFRAN) IV, mouth rinse, oxyCODONE, oxyCODONE, senna-docusate, traMADol

## 2022-02-23 NOTE — Progress Notes (Signed)
Subjective: 6 Days Post-Op Procedure(s) (LRB): TOTAL KNEE ARTHROPLASTY (Left) Patient reports pain as mild.   Patient is very sleepy upon exam and needed many attempts of arousal to complete exam Patient has been in A-fib all night and was given IV and PO Lopressor. HR was in 130s during my exam Patient reports No pain in knee. Denies CP/ SOB   Objective: Vital signs in last 24 hours: Temp:  [97.6 F (36.4 C)-99.9 F (37.7 C)] 98.2 F (36.8 C) (09/19 0400) Pulse Rate:  [64-168] 104 (09/19 0600) Resp:  [16-33] 17 (09/19 0600) BP: (68-161)/(34-77) 85/54 (09/19 0600) SpO2:  [87 %-98 %] 98 % (09/19 0600) Weight:  [74.9 kg] 74.9 kg (09/18 1650)  Intake/Output from previous day: 09/18 0701 - 09/19 0700 In: 244.4 [I.V.:244.4] Out: 630 [Urine:630] Intake/Output this shift: Total I/O In: 0  Out: 550 [Urine:550]  Recent Labs    02/20/22 1528 02/21/22 0627 02/22/22 0325  HGB 8.6* 7.9* 10.8*   Recent Labs    02/21/22 0627 02/22/22 0325  WBC 22.1* 25.1*  RBC 2.45* 3.48*  HCT 22.2* 32.3*  PLT 213 216   Recent Labs    02/22/22 0742 02/22/22 1113 02/22/22 2342 02/23/22 0339  NA 131*   < > 128* 131*  K 4.2  --   --  4.3  CL 103  --   --  101  CO2 19*  --   --  19*  BUN 25*  --   --  40*  CREATININE 1.19*  --   --  1.91*  GLUCOSE 127*  --   --  119*  CALCIUM 8.2*  --   --  8.4*   < > = values in this interval not displayed.   No results for input(s): "LABPT", "INR" in the last 72 hours.  General: patient very sleepy during exam, multiple verbal and physical arousals needed during exam  Neurovascular intact Sensation intact distally Intact pulses distally Dorsiflexion/Plantar flexion intact Incision: scant drainage Compartment soft Significant bruising in lower extremity Calf is supple Negative Homans    Assessment/Plan: 6 Days Post-Op Procedure(s) (LRB): TOTAL KNEE ARTHROPLASTY (Left) Continue with PT, looks like they are recommending SNF at this time and  I do believe that will be a good option for patient at this time Leave dressings on and intact until follow up in the office in 2 weeks Current DVT ppx is Lovenox,okay to switch to ASA 81 mg BID if okay with internal medicine Make sure patient is continuing to move leg as much as possible to help with swelling Okay to continue icing and elevating   Hyponatremia: Blood work seems to be improving, appreciate continued input from internal medicine and nephrology   Afib: Lopressor given last night IV per hospitalist, patient still in Afib this am when I saw her HR in [address] PA-C Dr. [name], [facility] [phone] 02/23/2022, 6:35 AM

## 2022-02-23 NOTE — Progress Notes (Addendum)
Rounding Note    Patient Name: [name] Date of Encounter: 02/23/2022  [place] Cardiologist: None   Subjective   Patient denies chest pain, palpitations, sob. Denies feeling any pain.   Per telemetry, patient is in atrial fibrillation with HR in the 130s   Inpatient Medications    Scheduled Meds:  amiodarone  150 mg Intravenous Once   Chlorhexidine Gluconate Cloth  6 each Topical Daily   enoxaparin (LOVENOX) injection  30 mg Subcutaneous Q24H   ezetimibe  10 mg Oral Daily   feeding supplement  237 mL Oral BID BM   pantoprazole  40 mg Oral Daily   PHENobarbital  97.2 mg Oral QHS   predniSONE  5 mg Oral QAC breakfast   Continuous Infusions:  amiodarone 60 mg/hr (02/23/22 0806)   Followed by   amiodarone     ceFEPime (MAXIPIME) IV     methocarbamol (ROBAXIN) IV     metronidazole     PRN Meds: acetaminophen, alum & mag hydroxide-simeth, bisacodyl, diphenhydrAMINE, HYDROmorphone (DILAUDID) injection, magnesium citrate, methocarbamol **OR** methocarbamol (ROBAXIN) IV, ondansetron **OR** ondansetron (ZOFRAN) IV, mouth rinse, oxyCODONE, oxyCODONE, senna-docusate, traMADol   Vital Signs    Vitals:   02/23/22 0600 02/23/22 0729 02/23/22 0730 02/23/22 0745  BP: (!) 85/54 112/63 100/60   Pulse: (!) 104  (!) 136   Resp: 17 (!) 23 (!) 24   Temp:    98.3 F (36.8 C)  TempSrc:    Oral  SpO2: 98%  (!) 88%   Weight:      Height:        Intake/Output Summary (Last 24 hours) at 02/23/2022 0815 Last data filed at 02/23/2022 0615 Gross per 24 hour  Intake 315.82 ml  Output 1030 ml  Net -714.18 ml      02/22/2022    4:50 PM 02/20/2022    6:29 PM 02/17/2022    2:30 PM  Last 3 Weights  Weight (lbs) 165 lb 2 oz 160 lb 15 oz 145 lb  Weight (kg) 74.9 kg 73 kg 65.772 kg      Telemetry    Atrial fibrillation, HR in the 130s - Personally Reviewed  ECG    Atrial fibrillation, HR 152 BPM - Personally Reviewed  Physical Exam   GEN: No acute distress.  Laying flat in the bed   Neck: No JVD Cardiac: Irregular rate and rhythm, tachycardic. Radial pulses 2+ bilaterally  Respiratory: Crackles in bilateral lung bases, otherwise clear to auscultation bilaterally. Normal WOB on [place] with 2L oxygen  GI: Soft, nontender, non-distended  MS: No edema in RLE. Left lower extremity with moderate swelling, notable bruising. Left knee covered in bandage Neuro:  Nonfocal  Psych: Normal affect   Labs    High Sensitivity Troponin:  No results for input(s): "TROPONINIHS" in the last 720 hours.   Chemistry Recent Labs  Lab 02/21/22 1839 02/21/22 2042 02/22/22 0742 02/22/22 1113 02/22/22 1708 02/22/22 2342 02/23/22 0339  NA 124*   < > 131*   < > 130* 128* 131*  K 4.5  --  4.2  --   --   --  4.3  CL 95*  --  103  --   --   --  101  CO2 21*  --  19*  --   --   --  19*  GLUCOSE 137*  --  127*  --   --   --  119*  BUN 14  --  25*  --   --   --  40*  CREATININE 0.54  --  1.19*  --   --   --  1.91*  CALCIUM 7.8*  --  8.2*  --   --   --  8.4*  MG  --   --   --   --   --   --  2.2  GFRNONAA >60  --  43*  --   --   --  25*  ANIONGAP 8  --  9  --   --   --  11   < > = values in this interval not displayed.    Lipids No results for input(s): "CHOL", "TRIG", "HDL", "LABVLDL", "LDLCALC", "CHOLHDL" in the last 168 hours.  Hematology Recent Labs  Lab 02/20/22 1528 02/21/22 0627 02/22/22 0325  WBC 28.4* 22.1* 25.1*  RBC 2.67* 2.45* 3.48*  HGB 8.6* 7.9* 10.8*  HCT 24.4* 22.2* 32.3*  MCV 91.4 90.6 92.8  MCH 32.2 32.2 31.0  MCHC 35.2 35.6 33.4  RDW 13.1 13.2 15.0  PLT 198 213 216   Thyroid  Recent Labs  Lab 02/20/22 1955  TSH 0.907    BNP Recent Labs  Lab 02/21/22 1052  BNP 218.1*    DDimer No results for input(s): "DDIMER" in the last 168 hours.   Radiology    DG CHEST PORT 1 VIEW  Result Date: 02/22/2022 CLINICAL DATA:  Hyponatremia.  Acute kidney injury. EXAM: PORTABLE CHEST 1 VIEW COMPARISON:  Radiographs 02/20/2022 and 07/29/2021.   CT 05/15/2021. FINDINGS: 1403 hours. The patient is mildly rotated to the left. Allowing for this, the heart size and mediastinal contours are stable. There is new retrocardiac airspace disease with partial obscuration of the left hemidiaphragm. Stable linear atelectasis or scarring at the right lung base. Possible small left pleural effusion. No evidence of pneumothorax or acute osseous abnormality. Telemetry leads overlie the chest. IMPRESSION: New left lower lobe atelectasis or early infiltrate. Recommend radiographic follow-up. Electronically Signed   By: Richardean Sale M.D.   On: 02/22/2022 15:38   ECHOCARDIOGRAM COMPLETE  Result Date: 02/21/2022    ECHOCARDIOGRAM REPORT   Patient Name:   [name] Date of Exam: 02/21/2022 Medical Rec #:  [id number]         Height:       61.0 in Accession #:    [id number]        Weight:       160.9 lb Date of Birth:  1929-10-12         BSA:          1.722 m Patient Age:    88 years          BP:           150/62 mmHg Patient Gender: F                 HR:           67 bpm. Exam Location:  Inpatient Procedure: 2D Echo, Cardiac Doppler and Color Doppler Indications:    Atrial fibrillation  History:        Patient has no prior history of Echocardiogram examinations.                 Risk Factors:Hypertension and Dyslipidemia.  Sonographer:    Jefferey Pica Referring Phys: [id number] RIPUDEEP K RAI IMPRESSIONS  1. Left ventricular ejection fraction, by estimation, is 60 to 65%. The left ventricle has normal function. The left ventricle has no regional wall motion abnormalities. Left ventricular diastolic parameters  are indeterminate. Elevated left ventricular end-diastolic pressure.  2. Right ventricular systolic function is normal. The right ventricular size is normal. There is moderately elevated pulmonary artery systolic pressure. The estimated right ventricular systolic pressure is 29.5 mmHg.  3. Left atrial size was moderately dilated.  4. The mitral valve is abnormal.  Moderate mitral valve regurgitation. Moderate mitral annular calcification.  5. The aortic valve is tricuspid. There is moderate calcification of the aortic valve. Aortic valve regurgitation is not visualized. Aortic valve sclerosis/calcification is present, without any evidence of aortic stenosis.  6. The inferior vena cava is normal in size with greater than 50% respiratory variability, suggesting right atrial pressure of 3 mmHg.  7. Echodensity seen in right atrium likely free wall and trabeculation visualized out of plane. Comparison(s): No prior Echocardiogram. FINDINGS  Left Ventricle: Left ventricular ejection fraction, by estimation, is 60 to 65%. The left ventricle has normal function. The left ventricle has no regional wall motion abnormalities. The left ventricular internal cavity size was normal in size. There is  borderline left ventricular hypertrophy. Left ventricular diastolic parameters are indeterminate. Elevated left ventricular end-diastolic pressure. Right Ventricle: The right ventricular size is normal. No increase in right ventricular wall thickness. Right ventricular systolic function is normal. There is moderately elevated pulmonary artery systolic pressure. The tricuspid regurgitant velocity is 3.54 m/s, and with an assumed right atrial pressure of 3 mmHg, the estimated right ventricular systolic pressure is 62.1 mmHg. Left Atrium: Left atrial size was moderately dilated. Right Atrium: Right atrial size was normal in size. Pericardium: There is no evidence of pericardial effusion. Presence of epicardial fat layer. Mitral Valve: The mitral valve is abnormal. There is mild calcification of the mitral valve leaflet(s). Moderate mitral annular calcification. Moderate mitral valve regurgitation. Tricuspid Valve: The tricuspid valve is grossly normal. Tricuspid valve regurgitation is mild. Aortic Valve: The aortic valve is tricuspid. There is moderate calcification of the aortic valve. There is  moderate aortic valve annular calcification. Aortic valve regurgitation is not visualized. Aortic valve sclerosis/calcification is present, without  any evidence of aortic stenosis. Aortic valve peak gradient measures 9.6 mmHg. Pulmonic Valve: The pulmonic valve was grossly normal. Pulmonic valve regurgitation is trivial. Aorta: The aortic root is normal in size and structure. Venous: The inferior vena cava is normal in size with greater than 50% respiratory variability, suggesting right atrial pressure of 3 mmHg. IAS/Shunts: No atrial level shunt detected by color flow Doppler.  LEFT VENTRICLE PLAX 2D LVIDd:         4.20 cm   Diastology LVIDs:         2.30 cm   LV e' medial:    6.47 cm/s LV PW:         1.10 cm   LV E/e' medial:  21.8 LV IVS:        1.00 cm   LV e' lateral:   8.32 cm/s LVOT diam:     1.70 cm   LV E/e' lateral: 16.9 LV SV:         54 LV SV Index:   32 LVOT Area:     2.27 cm  RIGHT VENTRICLE          IVC RV Basal diam:  2.70 cm  IVC diam: 1.80 cm TAPSE (M-mode): 1.9 cm LEFT ATRIUM             Index        RIGHT ATRIUM           Index  LA diam:        4.50 cm 2.61 cm/m   RA Area:     14.50 cm LA Vol (A2C):   83.1 ml 48.25 ml/m  RA Volume:   37.20 ml  21.60 ml/m LA Vol (A4C):   64.3 ml 37.34 ml/m LA Biplane Vol: 73.7 ml 42.80 ml/m  AORTIC VALVE                 PULMONIC VALVE AV Area (Vmax): 1.78 cm     PV Vmax:       1.04 m/s AV Vmax:        155.00 cm/s  PV Peak grad:  4.3 mmHg AV Peak Grad:   9.6 mmHg LVOT Vmax:      121.50 cm/s LVOT Vmean:     72.300 cm/s LVOT VTI:       0.239 m  AORTA Ao Root diam: 2.90 cm Ao Asc diam:  3.50 cm MITRAL VALVE                TRICUSPID VALVE MV Area (PHT): 6.43 cm     TR Peak grad:   50.1 mmHg MV Decel Time: 118 msec     TR Vmax:        354.00 cm/s MV E velocity: 141.00 cm/s MV A velocity: 62.70 cm/s   SHUNTS MV E/A ratio:  2.25         Systemic VTI:  0.24 m                             Systemic Diam: 1.70 cm Rozann Lesches MD Electronically signed by Rozann Lesches MD Signature Date/Time: 02/21/2022/2:54:18 PM    Final     Cardiac Studies   Echocardiogram 02/21/2022 1. Left ventricular ejection fraction, by estimation, is 60 to 65%. The  left ventricle has normal function. The left ventricle has no regional  wall motion abnormalities. Left ventricular diastolic parameters are  indeterminate. Elevated left ventricular  end-diastolic pressure.   2. Right ventricular systolic function is normal. The right ventricular  size is normal. There is moderately elevated pulmonary artery systolic  pressure. The estimated right ventricular systolic pressure is 70.9 mmHg.   3. Left atrial size was moderately dilated.   4. The mitral valve is abnormal. Moderate mitral valve regurgitation.  Moderate mitral annular calcification.   5. The aortic valve is tricuspid. There is moderate calcification of the  aortic valve. Aortic valve regurgitation is not visualized. Aortic valve  sclerosis/calcification is present, without any evidence of aortic  stenosis.   6. The inferior vena cava is normal in size with greater than 50%  respiratory variability, suggesting right atrial pressure of 3 mmHg.   7. Echodensity seen in right atrium likely free wall and trabeculation  visualized out of plane.   Comparison(s): No prior Echocardiogram.   Patient Profile     88 y.o. female with a hx of paroxysmal atrial fibrillation, Hypertension, moderate mitral valve regurgitation, hyperlipidemia, osteoarthritis, polymyalgia rheumatica, history of seizures/epilepsy who is being seen for the evaluation of paroxysmal atrial fibrillation at the request of Dr [name].  Assessment & Plan    Paroxysmal atrial fibrillation - Patient had a brief episode of afib earlier this admission, but she had converted to NSR after being given a dose of IV diltiazem.  - Now, she is back in atrial fibrillation with HR  - BP low overnight with SBP down to the 70s. BP  did improve with IV fluids,  most recently recorded as 100/60 - Started IV amiodarone with bolus  - CHADS-VASc 4 (agex2, gender, HTN)  - Start DOAC closer to DC (when hemoglobin stable, OK with ortho surgery)  - Echocardiogram yesterday showed EF 60-65%, elevated LVEDP, normal RV systolic function, moderately dilated LA,moderate mitral valve regurgitation  - Patient denies any SOB, but she is tachypnic on exam.  - Will discuss possible TEE/cardioversion with Dr. Harl Bowie given low BP, tachypnea. Not ideal as patient is not on [facility] and she has been anemic this admission (unstable hemoglobin, not sure if she could tolerate AC longterm yet)  - I also wonder if patient could have PE? (Tachcyardic, tachypnic, hypoxic post op) but will defer to primary team    Normocytic anemia - Likely blood loss anemia postoperatively  - Hemoglobin was down to 7.9, improved to 10.8 yesterday after patient received 2 units PRBC  - Ordered CBC this AM    Symptomatic Hyponatremia  - Defer to Nephrology and IM - Na improved to 131 today  - Note that patient has been receiving IV fluids and is currently net +2.9 L fluids since admission. LVEDP was elevated on echo. She had brisk urine output yesterday, putting out at least 1.1 L urine  - CXR showed new left lower lobe atelectasis or early infiltrate - BNP was elevated to 219 on 2/17.  - Defer possible diuresis to nephrology   Hypertension - Continue metoprolol tartrate 50 mg BID    Otherwise, per primary -Polymyalgia rheumatica -History of seizures/epilepsy -History of chronic arthritis in left knee, s/p total knee arthroplasty     For questions or updates, please contact [place] Please consult [email or web address] for contact info under        Signed, Margie Billet, PA-C  02/23/2022, 8:15 AM

## 2022-02-23 NOTE — Consult Note (Addendum)
NAME:  [name], MRN:  [id number], DOB:  1929-10-26, LOS: 2 ADMISSION DATE:  02/17/2022, CONSULTATION DATE:  02/23/22 REFERRING MD:  Cruzita Lederer CHIEF COMPLAINT:  Hypotension   History of Present Illness:  [name] is a 88 y.o. female who has a PMH as below. She underwent L TKA 9/13. Post op, on 9/16 she was noted to have a change in mental status with hypersomnolence. Labs demonstrated an acute drop in sodium to 116 (was 136 on 02/05/22). Hospitalists and nephrology were consulted and she was started on 3% NS. 9/17 she developed A.fib with RVR for which cardiology was consulted. She was started on Cardizem and converted to NSR. 9/18 CXR showed concern for LLL atelectasis vs infiltrate for which she was started on Vanc/Cefepime/Flagyl. On 9/19, she had a recurrence of AFRVR with rates up into the 150s and was then started on Amiodarone bolus and infusion. Shortly after Amiodarone was started, her blood pressure began to drop. She was given a fluid bolus which improved pressures temporarily before they dropped again. She was ultimately started on low dose Norepinephrine and PCCM was asked to assist with her care.  At the time of my evaluation, she was on 25mg NE and SBP was up to 128. NE was titrated down to 356m and I asked RN to continue to titrate down based off BP and mental status. Pt is currently hypersomnolent but does arouse to voice and tries to speak. She is clearly more altered compared to this morning per her daughter however who claims that she was conversant and joking around with family at that time.  Pertinent  Medical History:  has Osteoarthritis of right knee; Osteoarthritis of left knee; Acute metabolic encephalopathy; Acute hyponatremia; HCAP (healthcare-associated pneumonia); AKI (acute kidney injury) ([place] Hypotension; Paroxysmal atrial fibrillation with RVR (HCC[place]al PMR (polymyalgia rheumatica) ([place] and DNR (do not resuscitate) on their problem list.  Significant  Hospital Events: Including procedures, antibiotic start and stop dates in addition to other pertinent events   9/13 admit for L TKA 9/16 Hyponatremia, started on 3% NS 9/17 AFRVR, converted to NSR with Diltiazem 9/18 CXR with LLL infiltrate vs ATX, started on Vanc/Cefepime/Flagyl 9/19 recurrence of AFRVR, started on Amio bolus+infusion. Later developed hypotension, started on low dose NE  Interim History / Subjective:  SBP up to 128. Mental status still depressed but does open eyes to voice and try to speak. Daughter says still a difference from this AM when she was fully conversant though.  Objective:  Blood pressure (!) 108/35, pulse 75, temperature 97.7 F (36.5 C), temperature source Oral, resp. rate (!) 25, height '5\' 1"'$  (1.549 m), weight 74.9 kg, SpO2 96 %.        Intake/Output Summary (Last 24 hours) at 02/23/2022 1505 Last data filed at 02/23/2022 1000 Gross per 24 hour  Intake 825.93 ml  Output 1280 ml  Net -454.07 ml   Filed Weights   02/17/22 1430 02/20/22 1829 02/22/22 1650  Weight: 65.8 kg 73 kg 74.9 kg    Examination: General: Elderly female, resting in bed, in NAD. Neuro: Somonlent. Opens eyes to voice and tries to speak. HEENT: [place]/AT. Sclerae anicteric. EOMI. Cardiovascular: IRIR, no M/R/G.  Lungs: Respirations even and unlabored.  CTA bilaterally, No W/R/R. Abdomen: BS x 4, soft, NT/ND.  Musculoskeletal: No gross deformities, no edema.  Skin: Intact, warm, no rashes.  Labs/imaging personally reviewed:  Echo 9/18 > EF 6056-31%RV systolic function normal, PASP 53. CXR 9/18 > LLL ATX vs infiltrate.  Renal US 9/19 >   Assessment & Plan:   Hypotension - presumed adverse effect of Amiodarone given the timing. While she might have a LLL PNA, doesn't appear significant enough to cause that drop in BP. - Hold Amio (already off). - Continue low dose NE but continue to wean (already doing so). - Continue gentle fluids and supportive care. - Continue Solumedrol in  lieu of home Prednisone (on '5mg'$  daily for hx PMR). Would taper off fairly quickly though given recent ortho surgery etc.  A.fib with RVR with underlying hx of PAF- converted with Diltiazem initially but reverted and required Amiodarone which has since been stopped due to hypotension. Hx PAF, HTN, MVR, HLD. - Continue supportive care. - Maintain K > 4, Mg > 2. - Cardiology following, appreciate the assistance. - If has recurrence, will need to discuss with Cardiology regarding next steps. - Continue heparin.  Possible LLL HCAP vs atelectasis. - Continue empiric abx. - Follow cultures.  Acute hypoxic respiratory failure - presumed 2/2 above. Low suspicion for PE but certainly at risk given recent L TKA and of course also at risk for fat embolism. Unfortunately a CTA is not feasible at the moment given her renal injury; however, she is already on Heparin for her A.fib. Her echo from 9/17 was reassuring in that RV systolic function was normal. - Continue Heparin. - We can get LE dopplers for completeness sake though this won't give [place] any information regarding fat embolism.  AKI. Hyponatremia - improved after 3% NS which is now off. - Nephrology following, appreciate the assistance.  Acute encephalopathy - suspect related to hypotension as per RN and daughter, she was much more alert earlier; however, as BP started to drop, so did her mental status. - Continue supportive care. - Avoid sedating meds.  Hx Seizures. - Continue phenobarbital.  L TKA. - Post op care per ortho.  Goals of Care. - DNR per discussions with TRH and family.  Best practice (evaluated daily):  Diet/type: Regular consistency (see orders) DVT prophylaxis: systemic heparin GI prophylaxis: N/A Lines: N/A Foley:  Yes, and it is still needed Code Status:  DNR Last date of multidisciplinary goals of care discussion: Daughter updated at bedside 9/19.  Labs   CBC: Recent Labs  Lab 02/17/22 1510 02/20/22 1528  02/21/22 0627 02/22/22 0325 02/23/22 0929  WBC 13.5* 28.4* 22.1* 25.1* 28.2*  HGB 13.6 8.6* 7.9* 10.8* 9.9*  HCT 42.8 24.4* 22.2* 32.3* 31.3*  MCV 98.2 91.4 90.6 92.8 98.1  PLT 173 198 213 216 962    Basic Metabolic Panel: Recent Labs  Lab 02/20/22 1955 02/20/22 2142 02/21/22 0627 02/21/22 0956 02/21/22 1839 02/21/22 2042 02/22/22 0742 02/22/22 1113 02/22/22 1708 02/22/22 2342 02/23/22 0339  NA 114*   < > 120*   < > 124*   < > 131* 130* 130* 128* 131*  K 3.8  --  3.4*  --  4.5  --  4.2  --   --   --  4.3  CL 81*  --  91*  --  95*  --  103  --   --   --  101  CO2 21*  --  22  --  21*  --  19*  --   --   --  19*  GLUCOSE 164*  --  132*  --  137*  --  127*  --   --   --  119*  BUN 12  --  11  --  14  --  25*  --   --   --  40*  CREATININE 0.48  --  0.54  --  0.54  --  1.19*  --   --   --  1.91*  CALCIUM 7.7*  --  8.0*  --  7.8*  --  8.2*  --   --   --  8.4*  MG  --   --   --   --   --   --   --   --   --   --  2.2   < > = values in this interval not displayed.   GFR: Estimated Creatinine Clearance: 18.1 mL/min (A) (by C-G formula based on SCr of 1.91 mg/dL (H)). Recent Labs  Lab 02/20/22 1528 02/20/22 1955 02/21/22 0627 02/22/22 0325 02/23/22 0929  PROCALCITON  --  0.16  --   --   --   WBC 28.4*  --  22.1* 25.1* 28.2*    Liver Function Tests: No results for input(s): "AST", "ALT", "ALKPHOS", "BILITOT", "PROT", "ALBUMIN" in the last 168 hours. No results for input(s): "LIPASE", "AMYLASE" in the last 168 hours. No results for input(s): "AMMONIA" in the last 168 hours.  ABG No results found for: "PHART", "PCO2ART", "PO2ART", "HCO3", "TCO2", "ACIDBASEDEF", "O2SAT"   Coagulation Profile: No results for input(s): "INR", "PROTIME" in the last 168 hours.  Cardiac Enzymes: No results for input(s): "CKTOTAL", "CKMB", "CKMBINDEX", "TROPONINI" in the last 168 hours.  HbA1C: No results found for: "HGBA1C"  CBG: Recent Labs  Lab 02/20/22 0746  GLUCAP 149*     Review of Systems:   Unable to obtain as pt is encephalopathic.  Past Medical History:  She,  has a past medical history of Anemia, Anxiety, Arthritis, Atrial fibrillation ([place]), Cancer ([place]), Chronic back pain, Depression, Epilepsy ([place]), GERD (gastroesophageal reflux disease), History of kidney stones, Hyperlipidemia, Hypertension, Macular degeneration, Mitral regurgitation, Pneumonia, Polymyalgia rheumatica ([place]), Rheumatoid arthritis ([place]), and Seizures ([place]).   Surgical History:   Past Surgical History:  Procedure Laterality Date   ABDOMINAL HYSTERECTOMY     BREAST SURGERY Right    Mastectomy   CARPAL TUNNEL RELEASE Left    TONSILLECTOMY     TOTAL KNEE ARTHROPLASTY Right 01/04/2020   Procedure: TOTAL KNEE ARTHROPLASTY;  Surgeon: Sydnee Cabal, MD;  Location: [facility];  Service: Orthopedics;  Laterality: Right;  adductor canal   TOTAL KNEE ARTHROPLASTY Left 02/17/2022   Procedure: TOTAL KNEE ARTHROPLASTY;  Surgeon: Sydnee Cabal, MD;  Location: [facility];  Service: Orthopedics;  Laterality: Left;  adductor canal block 120     Social History:   reports that she has never smoked. She has never used smokeless tobacco. She reports that she does not drink alcohol and does not use drugs.   Family History:  Her family history is not on file.   Allergies Allergies  Allergen Reactions   Sulfa Antibiotics Nausea Only   Penicillins Swelling    Tolerated Cephalosporin 01/04/20      Simvastatin Swelling     Home Medications  Prior to Admission medications   Medication Sig Start Date End Date Taking? Authorizing Provider  acetaminophen (TYLENOL) 500 MG tablet Take 2 tablets (1,000 mg total) by mouth every 6 (six) hours as needed. 07/09/18  Yes Charlesetta Shanks, MD  celecoxib (CELEBREX) 200 MG capsule Take 200 mg by mouth daily. 07/12/21  Yes [provider]  cholecalciferol (VITAMIN D3) 25 MCG (1000 UNIT) tablet Take 1,000 Units by mouth daily.   Yes  [provider]   escitalopram (LEXAPRO) 10 MG tablet Take 10 mg by mouth daily.   Yes [provider]  ezetimibe (ZETIA) 10 MG tablet Take 10 mg by mouth daily.   Yes [provider]  metoprolol tartrate (LOPRESSOR) 25 MG tablet Take 25 mg by mouth 2 (two) times daily. 01/06/22  Yes [provider]  Multiple Vitamins-Minerals (MULTIVITAMIN WITH MINERALS) tablet Take 1 tablet by mouth daily.   Yes [provider]  pantoprazole (PROTONIX) 40 MG tablet Take 40 mg by mouth daily.  01/14/14  Yes [provider]  PHENobarbital (LUMINAL) 97.2 MG tablet Take 97.2 mg by mouth at bedtime.  05/17/16  Yes [provider]  predniSONE (DELTASONE) 5 MG tablet Take 5 mg by mouth daily with breakfast.  02/22/18  Yes [provider]  bisacodyl (DULCOLAX) 5 MG EC tablet Take 1 tablet (5 mg total) by mouth daily as needed for moderate constipation. 02/19/22   Drue Novel, PA  methocarbamol (ROBAXIN) 500 MG tablet Take 1 tablet (500 mg total) by mouth every 6 (six) hours as needed for muscle spasms. 02/19/22   Haus, Cordelia Pen, PA  ondansetron (ZOFRAN) 4 MG tablet Take 1 tablet (4 mg total) by mouth every 6 (six) hours as needed for nausea. 02/19/22   Drue Novel, PA  oxyCODONE (OXY IR/ROXICODONE) 5 MG immediate release tablet Take 1-2 tablets (5-10 mg total) by mouth every 4 (four) hours as needed for moderate pain or severe pain (pain score 4-6). 02/19/22   Haus, Cordelia Pen, PA  traMADol (ULTRAM) 50 MG tablet Take 1 tablet (50 mg total) by mouth every 6 (six) hours. 02/19/22   Drue Novel, PA     Critical care time: 35 min.   Montey Hora, [facility] For pager details, please see AMION or use Epic chat  After 1900, please call [facility] for cross coverage needs 02/23/2022, 3:05 PM

## 2022-02-23 NOTE — Progress Notes (Signed)
Patient ID: [name], female   DOB: [birth year], 88 y.o.   MRN: [id number] All events noted and appreciate everyones help. Now awake alert Ob  by 4. Will hold knee ROM while anticoagulated. Expect challenging and possibly lengthy recovery with all that's happening. Discussed with patient and answered all questions.

## 2022-02-23 NOTE — Progress Notes (Signed)
Pharmacy Antibiotic Note  [name] is a 88 y.o. female who was admitted to [facility] on 02/17/2022 for left TKA.  She subsequently developed hyponatremia postop and now with AKI. M CXR on 9/18 showed new left lower lobe atelectasis or early infiltrate.  Pharmacy has been consulted to dose cefepime for PNA.  Today, 02/23/2022: - scr up 1.91 (crcl~18)  Plan: - cefepime 2gm IV q24h - monitor renal function closely  ______________________________________  Height: '5\' 1"'$  (154.9 cm) Weight: 74.9 kg (165 lb 2 oz) IBW/kg (Calculated) : 47.8  Temp (24hrs), Avg:98.7 F (37.1 C), Min:97.6 F (36.4 C), Max:99.9 F (37.7 C)  Recent Labs  Lab 02/17/22 1510 02/20/22 1528 02/20/22 1955 02/21/22 0627 02/21/22 1839 02/22/22 0325 02/22/22 0742 02/23/22 0339  WBC 13.5* 28.4*  --  22.1*  --  25.1*  --   --   CREATININE 0.60 0.47 0.48 0.54 0.54  --  1.19* 1.91*    Estimated Creatinine Clearance: 18.1 mL/min (A) (by C-G formula based on SCr of 1.91 mg/dL (H)).    Allergies  Allergen Reactions   Sulfa Antibiotics Nausea Only   Penicillins Swelling    Tolerated Cephalosporin 01/04/20      Simvastatin Swelling     Thank you for allowing pharmacy to be a part of this patient's care.  Lynelle Doctor 02/23/2022 6:46 AM

## 2022-02-23 NOTE — Progress Notes (Addendum)
PROGRESS NOTE  [name] NTI:[id number] DOB: [birth year] DOA: 02/17/2022 PCP: [name], MD   LOS: 2 days   Brief Narrative / Interim history: [name] is a 88 y.o. female with past medical history of hypertension, GERD, arthritis, polymyalgia rheumatica, seizures admitted under orthopedic service for left knee pain, underwent total knee arthroplasty on 9/13.  After surgery, patient was noted to be lethargic, slow to respond by the daughters (one of them is a retired Therapist, sports). BMET done showed hyponatremia with sodium 116, last sodium 136 on 02/05/2022.  Hospitalist team was consulted.  Significant events: 9/13-admit to the hospital, total knee arthroplasty 9/16-concern for weakness, labs show sodium of 116.  TRH and nephrology consulted, moved to ICU and placed on hypertonic saline 9/17-A-fib with RVR, cardiology consulted, converted to sinus with diltiazem 9/19-recurrence of A-fib with RVR, with hypotension, placed on amiodarone drip.    Significant imaging / results / micro data: Chest x-ray 9/16-no active disease Blood cultures 9/16-no growth Chest x-ray 9/18-new left lower lobe atelectasis or early infiltrate Vancomycin, cefepime, Flagyl started 9/19 Blood cultures 9/19 -   Subjective / 24h Interval events: More hypotensive overnight, A-fib with RVR has recurred, rates up to 150s at times.  Blood pressure as low as 70s, received bolus x2 overnight.  Appears lethargic this morning, but would wake up when prompted, alert and oriented x4, answering questions appropriately.  States that she is feeling well, denies any shortness of breath  Assesement and Plan: Principal Problem:   Osteoarthritis of left knee Active Problems:   Acute metabolic encephalopathy   Acute hyponatremia   HCAP (healthcare-associated pneumonia)   AKI (acute kidney injury) (HCC)   Hypotension   Paroxysmal atrial fibrillation with RVR (HCC)   PMR (polymyalgia rheumatica) (HCC)   DNR  (do not resuscitate)   Principal problem Paroxysmal Afib with RVR-Patient has a history of paroxysmal A-fib, follows Dr. Otho Perl (cardiology), was not on anticoagulation prior to admission, was doing fairly well in NSR.  -Went briefly into A-fib on 9/17, self converted to sinus, however is back into A-fib with RVR this morning with rates up to 150s, associated with hypotension.  Unlikely to tolerate metoprolol, started on amiodarone infusion.  Cardiology following as well. -Start heparin, discussed with orthopedics  Active problems Hypotension-new overnight, possibly multifactorial given A-fib with RVR, recent surgery and potential PE is a consideration however cannot use contrast with current kidney function, also probable HCAP could contribute.  -Intermittent boluses, add midodrine.  May need vasopressors later on -Currently on amiodarone, heparin, antibiotics  Acute kidney injury-likely in the setting of intermittent hypotension, also received Lasix due to third spacing and tachypnea.  Later on the day, bladder scan revealed 900 cc, suggesting retention in the setting of acute illness/immobility.  Place a Foley catheter, voiding trial later on when kidney function normalizes and she is more mobile  HCAP-patient has been having progressive leukocytosis for the past few days.  Chest x-ray concerning for left lower lobe atelectasis versus early infiltrate.  She is more tachypneic, this may just be atelectasis however given leukocytosis and hypotension would favor doing antibiotics.  Start form, cefepime, Flagyl as she is at risk for aspiration  History of chronic arthritis, left knee -status post total knee arthroplasty on 9/13. Management and plan per orthopedics  Normocytic anemia, postop blood loss anemia-hemoglobin dipped as low as 7.9, received 2 units of packed red blood cells followed by Lasix.  Hemoglobin improved appropriately  Hyponatremia -Last sodium checked on 9/1  was 136, no labs on  admission, noted to be lethargic and slower to respond on 9/16.  Sodium 116 on bmet on 9/16.  TRH as well as nephrology consulted.  She was moved to stepdown/ICU, and started on hypertonic saline.  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  Continue to hold Lexapro.  Sodium has now improved, stable since yesterday 128-131, hypertonic saline has now been discontinued  Hypertension-now hypotensive, started on midodrine   Polymyalgia rheumatica -on prednisone normally, discontinue that given hypotension and start higher dose steroids in the setting of acute illness.  Of note, cortisol was normal   History of seizures/epilepsy -Continue phenobarbital   GERD - continue PPI  Goals of care-discussed CODE STATUS with the daughter/POA at bedside, she is DNR.  Continue medical management short of CPR/intubation.  Daughters understand the complexity of her mom's conditions with worsening renal function, A-fib with RVR, hypotension and concern for HCAP.   Scheduled Meds:  Chlorhexidine Gluconate Cloth  6 each Topical Daily   ezetimibe  10 mg Oral Daily   feeding supplement  237 mL Oral BID BM   methylPREDNISolone (SOLU-MEDROL) injection  40 mg Intravenous Q12H   midodrine  10 mg Oral TID WC   pantoprazole  40 mg Oral Daily   PHENobarbital  97.2 mg Oral QHS   Continuous Infusions:  amiodarone 60 mg/hr (02/23/22 1000)   Followed by   amiodarone     ceFEPime (MAXIPIME) IV Stopped (02/23/22 0845)   methocarbamol (ROBAXIN) IV     metronidazole Stopped (02/23/22 0950)   vancomycin     PRN Meds:.acetaminophen, alum & mag hydroxide-simeth, bisacodyl, diphenhydrAMINE, HYDROmorphone (DILAUDID) injection, magnesium citrate, methocarbamol **OR** methocarbamol (ROBAXIN) IV, ondansetron **OR** ondansetron (ZOFRAN) IV, mouth rinse, oxyCODONE, oxyCODONE, senna-docusate, traMADol  Current Outpatient Medications  Medication Instructions   acetaminophen (TYLENOL) 1,000 mg, Oral, Every 6 hours PRN    bisacodyl (DULCOLAX) 5 mg, Oral, Daily PRN   celecoxib (CELEBREX) 200 mg, Oral, Daily   cholecalciferol (VITAMIN D3) 1,000 Units, Oral, Daily   escitalopram (LEXAPRO) 10 mg, Oral, Daily   ezetimibe (ZETIA) 10 mg, Oral, Daily   methocarbamol (ROBAXIN) 500 mg, Oral, Every 6 hours PRN   metoprolol tartrate (LOPRESSOR) 25 mg, Oral, 2 times daily   Multiple Vitamins-Minerals (MULTIVITAMIN WITH MINERALS) tablet 1 tablet, Oral, Daily   ondansetron (ZOFRAN) 4 mg, Oral, Every 6 hours PRN   oxyCODONE (OXY IR/ROXICODONE) 5-10 mg, Oral, Every 4 hours PRN   pantoprazole (PROTONIX) 40 mg, Oral, Daily   PHENobarbital (LUMINAL) 97.2 mg, Oral, Daily at bedtime   predniSONE (DELTASONE) 5 mg, Oral, Daily with breakfast   traMADol (ULTRAM) 50 mg, Oral, Every 6 hours    Diet Orders (From admission, onward)     Start     Ordered   02/20/22 2208  Diet regular Room service appropriate? Yes; Fluid consistency: Thin; Fluid restriction: 1200 mL Fluid  Diet effective now       Question Answer Comment  Room service appropriate? Yes   Fluid consistency: Thin   Fluid restriction: 1200 mL Fluid      02/20/22 2207   02/19/22 0000  Diet - low sodium heart healthy        02/19/22 0713            DVT prophylaxis: SCDs Start: 02/17/22 1101   Lab Results  Component Value Date   PLT 222 02/23/2022      Code Status: DNR  Family Communication: 2 (of the 7) daughters present at bedside  Status is: Inpatient Remains inpatient appropriate because: Severity of illness   Level of care: ICU  Objective: Vitals:   02/23/22 0905 02/23/22 0930 02/23/22 1000 02/23/22 1010  BP: (!) 91/55 (!) 92/53 (!) 73/45 (!) 90/35  Pulse: (!) 119 (!) 124 (!) 116 (!) 107  Resp: (!) 29 (!) '25 19 19  '$ Temp:      TempSrc:      SpO2: (!) 86% 96% 94% 94%  Weight:      Height:        Intake/Output Summary (Last 24 hours) at 02/23/2022 1033 Last data filed at 02/23/2022 1000 Gross per 24 hour  Intake 825.93 ml  Output 1280  ml  Net -454.07 ml   Wt Readings from Last 3 Encounters:  02/22/22 74.9 kg  02/05/22 65.5 kg  12/24/21 67 kg    Examination:  Constitutional: Lethargic, but wakes up and answers questions appropriately Eyes: lids and conjunctivae normal, no scleral icterus ENMT: mmm Neck: normal, supple Respiratory: Tachypneic, diminished at the bases but no significant wheezing Cardiovascular: Irregular, tachycardic, 1+ pitting edema Abdomen: soft, no distention, no tenderness. Bowel sounds positive.  Skin: no rashes Neurologic: no focal deficits, equal strength   Data Reviewed: I have independently reviewed following labs and imaging studies   CBC Recent Labs  Lab 02/17/22 1510 02/20/22 1528 02/21/22 0627 02/22/22 0325 02/23/22 0929  WBC 13.5* 28.4* 22.1* 25.1* 28.2*  HGB 13.6 8.6* 7.9* 10.8* 9.9*  HCT 42.8 24.4* 22.2* 32.3* 31.3*  PLT 173 198 213 216 222  MCV 98.2 91.4 90.6 92.8 98.1  MCH 31.2 32.2 32.2 31.0 31.0  MCHC 31.8 35.2 35.6 33.4 31.6  RDW 13.9 13.1 13.2 15.0 15.9*    Recent Labs  Lab 02/20/22 1955 02/20/22 2142 02/21/22 0627 02/21/22 0956 02/21/22 1052 02/21/22 1452 02/21/22 1839 02/21/22 2042 02/22/22 0742 02/22/22 1113 02/22/22 1708 02/22/22 2342 02/23/22 0339  NA 114*   < > 120*   < >  --    < > 124*   < > 131* 130* 130* 128* 131*  K 3.8  --  3.4*  --   --   --  4.5  --  4.2  --   --   --  4.3  CL 81*  --  91*  --   --   --  95*  --  103  --   --   --  101  CO2 21*  --  22  --   --   --  21*  --  19*  --   --   --  19*  GLUCOSE 164*  --  132*  --   --   --  137*  --  127*  --   --   --  119*  BUN 12  --  11  --   --   --  14  --  25*  --   --   --  40*  CREATININE 0.48  --  0.54  --   --   --  0.54  --  1.19*  --   --   --  1.91*  CALCIUM 7.7*  --  8.0*  --   --   --  7.8*  --  8.2*  --   --   --  8.4*  MG  --   --   --   --   --   --   --   --   --   --   --   --  2.2  PROCALCITON 0.16  --   --   --   --   --   --   --   --   --   --   --   --   TSH  0.907  --   --   --   --   --   --   --   --   --   --   --   --   BNP  --   --   --   --  218.1*  --   --   --   --   --   --   --   --    < > = values in this interval not displayed.    ------------------------------------------------------------------------------------------------------------------ No results for input(s): "CHOL", "HDL", "LDLCALC", "TRIG", "CHOLHDL", "LDLDIRECT" in the last 72 hours.  No results found for: "HGBA1C" ------------------------------------------------------------------------------------------------------------------ Recent Labs    02/20/22 1955  TSH 0.907    Cardiac Enzymes No results for input(s): "CKMB", "TROPONINI", "MYOGLOBIN" in the last 168 hours.  Invalid input(s): "CK" ------------------------------------------------------------------------------------------------------------------    Component Value Date/Time   BNP 218.1 (H) 02/21/2022 1052    CBG: Recent Labs  Lab 02/20/22 0746  GLUCAP 149*    Recent Results (from the past 240 hour(s))  Culture, blood (Routine X 2) w Reflex to ID Panel     Status: None (Preliminary result)   Collection Time: 02/20/22  7:55 PM   Specimen: BLOOD  Result Value Ref Range Status   Specimen Description   Final    BLOOD LEFT ANTECUBITAL Performed at [facility], [place] [address] [place], [place] [address]    Special Requests   Final    BOTTLES DRAWN AEROBIC AND ANAEROBIC Blood Culture adequate volume Performed at [facility] [address], [place], [place] [address]    Culture   Final    NO GROWTH 2 DAYS Performed at [place] [address] [place], [place] [address]    Report Status PENDING  Incomplete  Culture, blood (Routine X 2) w Reflex to ID Panel     Status: None (Preliminary result)   Collection Time: 02/20/22  8:07 PM   Specimen: BLOOD  Result Value Ref Range Status   Specimen Description   Final    BLOOD BLOOD RIGHT HAND Performed  at [place] [address] [place], [place] [address]    Special Requests   Final    BOTTLES DRAWN AEROBIC ONLY Blood Culture results may not be optimal due to an inadequate volume of blood received in culture bottles Performed at [facility] [address] [place], [place] [address]    Culture   Final    NO GROWTH 2 DAYS Performed at [facility] [address] [place], [place] [address]    Report Status PENDING  Incomplete     Radiology Studies: DG CHEST PORT 1 VIEW  Result Date: 02/22/2022 CLINICAL DATA:  Hyponatremia.  Acute kidney injury. EXAM: PORTABLE CHEST 1 VIEW COMPARISON:  Radiographs 02/20/2022 and 07/29/2021.  CT 05/15/2021. FINDINGS: 1403 hours. The patient is mildly rotated to the left. Allowing for this, the heart size and mediastinal contours are stable. There is new retrocardiac airspace disease with partial obscuration of the left hemidiaphragm. Stable linear atelectasis or scarring at the right lung base. Possible small left pleural effusion. No evidence of pneumothorax or acute osseous abnormality. Telemetry leads overlie the  chest. IMPRESSION: New left lower lobe atelectasis or early infiltrate. Recommend radiographic follow-up. Electronically Signed   By: Richardean Sale M.D.   On: 02/22/2022 15:38     Marzetta Board, MD, PhD [facility]  Between 7 am - 7 pm I am available, please contact me via Amion (for emergencies) or Securechat (non urgent messages)  Between 7 pm - 7 am I am not available, please contact night coverage MD/APP via Amion

## 2022-02-23 NOTE — Progress Notes (Signed)
Noted patient reassessment reveals that the patient is still in a-FIB with rates of 90-115. No c/o of discomfort at this time

## 2022-02-23 NOTE — Progress Notes (Signed)
An USGPIV (ultrasound guided PIV) has been placed for short-term vasopressor infusion. A correctly placed ivWatch must be used when administering Vasopressors. Should this treatment be needed beyond 72 hours, central line access should be obtained.  It will be the responsibility of the bedside nurse to follow best practice to prevent extravasations.   ?

## 2022-02-24 DIAGNOSIS — I2510 Atherosclerotic heart disease of native coronary artery without angina pectoris: Secondary | ICD-10-CM | POA: Diagnosis not present

## 2022-02-24 DIAGNOSIS — G9341 Metabolic encephalopathy: Secondary | ICD-10-CM | POA: Diagnosis not present

## 2022-02-24 DIAGNOSIS — E871 Hypo-osmolality and hyponatremia: Secondary | ICD-10-CM | POA: Diagnosis not present

## 2022-02-24 DIAGNOSIS — M1712 Unilateral primary osteoarthritis, left knee: Secondary | ICD-10-CM | POA: Diagnosis not present

## 2022-02-24 DIAGNOSIS — I959 Hypotension, unspecified: Secondary | ICD-10-CM | POA: Diagnosis not present

## 2022-02-24 LAB — COMPREHENSIVE METABOLIC PANEL
ALT: 24 U/L (ref 0–44)
AST: 22 U/L (ref 15–41)
Albumin: 2.6 g/dL — ABNORMAL LOW (ref 3.5–5.0)
Alkaline Phosphatase: 81 U/L (ref 38–126)
Anion gap: 9 (ref 5–15)
BUN: 33 mg/dL — ABNORMAL HIGH (ref 8–23)
CO2: 21 mmol/L — ABNORMAL LOW (ref 22–32)
Calcium: 8.2 mg/dL — ABNORMAL LOW (ref 8.9–10.3)
Chloride: 107 mmol/L (ref 98–111)
Creatinine, Ser: 1.06 mg/dL — ABNORMAL HIGH (ref 0.44–1.00)
GFR, Estimated: 50 mL/min — ABNORMAL LOW (ref >60)
Glucose, Bld: 145 mg/dL — ABNORMAL HIGH (ref 70–99)
Potassium: 3.4 mmol/L — ABNORMAL LOW (ref 3.5–5.1)
Sodium: 137 mmol/L (ref 135–145)
Total Bilirubin: 1.2 mg/dL (ref 0.3–1.2)
Total Protein: 5.4 g/dL — ABNORMAL LOW (ref 6.5–8.1)

## 2022-02-24 LAB — CBC
HCT: 28.2 % — ABNORMAL LOW (ref 36.0–46.0)
Hemoglobin: 9.3 g/dL — ABNORMAL LOW (ref 12.0–15.0)
MCH: 30.8 pg (ref 26.0–34.0)
MCHC: 33 g/dL (ref 30.0–36.0)
MCV: 93.4 fL (ref 80.0–100.0)
Platelets: 264 10*3/uL (ref 150–400)
RBC: 3.02 MIL/uL — ABNORMAL LOW (ref 3.87–5.11)
RDW: 15.8 % — ABNORMAL HIGH (ref 11.5–15.5)
WBC: 31.4 10*3/uL — ABNORMAL HIGH (ref 4.0–10.5)
nRBC: 0.1 % (ref 0.0–0.2)

## 2022-02-24 LAB — OSMOLALITY

## 2022-02-24 LAB — HEPARIN LEVEL (UNFRACTIONATED): Heparin Unfractionated: 0.67 IU/mL (ref 0.30–0.70)

## 2022-02-24 LAB — MAGNESIUM: Magnesium: 2.4 mg/dL (ref 1.7–2.4)

## 2022-02-24 LAB — PHOSPHORUS: Phosphorus: 3 mg/dL (ref 2.5–4.6)

## 2022-02-24 MED ORDER — METHYLPREDNISOLONE SODIUM SUCC 40 MG IJ SOLR
40.0000 mg | Freq: Every day | INTRAMUSCULAR | Status: DC
  Administered 2022-02-24: 40 mg via INTRAVENOUS
  Filled 2022-02-24: qty 1

## 2022-02-24 MED ORDER — OXYCODONE HCL 5 MG PO TABS
5.0000 mg | ORAL_TABLET | ORAL | Status: DC | PRN
  Administered 2022-02-28 – 2022-03-02 (×5): 5 mg via ORAL
  Filled 2022-02-24 (×6): qty 1

## 2022-02-24 MED ORDER — FUROSEMIDE 40 MG PO TABS
40.0000 mg | ORAL_TABLET | Freq: Every day | ORAL | Status: DC
  Administered 2022-02-24 – 2022-03-03 (×8): 40 mg via ORAL
  Filled 2022-02-24 (×8): qty 1

## 2022-02-24 MED ORDER — AMIODARONE HCL 200 MG PO TABS
400.0000 mg | ORAL_TABLET | Freq: Two times a day (BID) | ORAL | Status: DC
  Administered 2022-02-24 – 2022-03-03 (×14): 400 mg via ORAL
  Filled 2022-02-24 (×14): qty 2

## 2022-02-24 MED ORDER — POTASSIUM CHLORIDE CRYS ER 20 MEQ PO TBCR
40.0000 meq | EXTENDED_RELEASE_TABLET | Freq: Once | ORAL | Status: AC
  Administered 2022-02-24: 40 meq via ORAL
  Filled 2022-02-24: qty 2

## 2022-02-24 MED ORDER — SODIUM CHLORIDE 0.9 % IV SOLN
2.0000 g | Freq: Two times a day (BID) | INTRAVENOUS | Status: DC
  Administered 2022-02-24 – 2022-02-27 (×6): 2 g via INTRAVENOUS
  Filled 2022-02-24 (×6): qty 12.5

## 2022-02-24 MED ORDER — VANCOMYCIN HCL 750 MG/150ML IV SOLN: 750.0000 mg | INTRAVENOUS | Status: DC

## 2022-02-24 MED ORDER — AMIODARONE HCL 200 MG PO TABS
200.0000 mg | ORAL_TABLET | Freq: Two times a day (BID) | ORAL | Status: DC
  Administered 2022-02-24: 200 mg via ORAL
  Filled 2022-02-24: qty 1

## 2022-02-24 MED ORDER — OXYCODONE HCL 5 MG PO TABS: 5.0000 mg | ORAL_TABLET | ORAL | Status: DC | PRN

## 2022-02-24 NOTE — Progress Notes (Addendum)
[facility] Progress Note  Subjective: seem in room, Na+ 131 this am. Got IV lasix yest w/ not impressive diuresis. BP's dropped this am into 70's, got some NS boluses and up to 90s now.   Vitals:   02/24/22 0600 02/24/22 0700 02/24/22 0800 02/24/22 0900  BP: (!) 136/50 (!) 164/49 (!) 126/54 (!) 120/39  Pulse: 86 87 (!) 105 (!) 110  Resp: 20 (!) 21 (!) 27 (!) 32  Temp:   98 F (36.7 C)   TempSrc:   Oral   SpO2: 94% 95% 92% (!) 89%  Weight:      Height:        Exam: Gen alert, no distress Sclera anicteric, throat clear  No jvd or bruits Chest clear bilat to bases RRR no MRG Abd soft ntnd no mass or ascites +bs Ext 1-2+ bilat L > R hip edema, min pretib edema Neuro is alert, Ox 3 , nf    Home meds - celecoxib, escitalopram, ezetimibe, metoprolol 25 bid, MVI, pantoprazole, phenobarbital, prednisone 5 am, vits/ supps/ prns     UNa 64, UOsm 520 (02/20/22)  BP's 140- 75, HR 60-80, afeb  Na 131  KI+ 4.2  CO2 19  BUN 25  Creat 1.19  Assessment/ Plan: Hyponatremia - severe on admission, possibly acute but no labs for comparison so we opted to treat as chronic. Pt was confused on admission w/ Na= 116, nadir 114 on 9/16. 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 Urine studies supported SIADH. TSH and cortisol are wnl. We stopped home SSRI and started 3% saline on 9/16 w/ improved Na+ up to 130 after about 48 hrs. 3% Na stopped 9/18. Then pt developed AKI w/ rising creat and vol overload not responding to IV lasix. Bladder scan showed 900 cc urine > foley placed last night and pt had > 3000 cc UOP overnight w/ drop in creat to 1.0 and bump up of Na+ to 137.  Suspect bladder retention cause of AKI and possibly also the hyponatremia to some degree. Both AKI/ hyponatremia now resolved and pt is much more alert. OK to give IVF"s if BP's < 100, otherwise would allow her to autodiurese. No other suggestions, will sign off. Call w/ any questions.  AKI -  due to bladder retention, resolving after foley placement w/ 900 cc in bladder. Not sure cause, defer to medical team.  Bladder urinary retention - per pmd, foley in now Hypotension - BB and cardizem stopped, getting NS 50 cc/hr, BP's better today. Getting midodrine for temp BP support.  Atrial fib - amio, metoprolol and cardizem now on hold as above Leukocytosis -  WBC down yest and up slightly today, low grade fever resolving.  SP L TKA - ortho following  [name] 02/24/2022, 9:55 AM   Recent Labs  Lab 02/23/22 0339 02/23/22 0929 02/24/22 0316  HGB  --  9.9* 9.3*  ALBUMIN 2.7*  --  2.6*  CALCIUM 8.4*  --  8.2*  PHOS  --   --  3.0  CREATININE 1.91*  --  1.06*  K 4.3  --  3.4*    No results for input(s): "IRON", "TIBC", "FERRITIN" in the last 168 hours. Inpatient medications:  Chlorhexidine Gluconate Cloth  6 each Topical Daily   ezetimibe  10 mg Oral Daily   feeding supplement  237 mL Oral BID BM   methylPREDNISolone (SOLU-MEDROL) injection  40 mg Intravenous Daily   midodrine  10 mg Oral TID  WC   pantoprazole  40 mg Oral Daily   PHENobarbital  97.2 mg Oral QHS    sodium chloride 50 mL/hr at 02/24/22 0752   sodium chloride     amiodarone 30 mg/hr (02/24/22 0752)   ceFEPime (MAXIPIME) IV     heparin 1,050 Units/hr (02/24/22 0752)   methocarbamol (ROBAXIN) IV     norepinephrine (LEVOPHED) Adult infusion Stopped (02/24/22 0729)   acetaminophen, alum & mag hydroxide-simeth, bisacodyl, diphenhydrAMINE, HYDROmorphone (DILAUDID) injection, magnesium citrate, methocarbamol **OR** methocarbamol (ROBAXIN) IV, ondansetron **OR** ondansetron (ZOFRAN) IV, mouth rinse, oxyCODONE, oxyCODONE, senna-docusate, traMADol

## 2022-02-24 NOTE — Progress Notes (Signed)
Occupational Therapy Treatment Patient Details Name: [name] MRN: [id number] DOB: 1929/11/20 Today's Date: 02/24/2022   History of present illness Patient is a 88 y.o. female s/p L-TKA on 02/17/22. patient was noted to trasnfer to sepdown unit with crital value for sodium. PMH significant for seizures, HTN, GERD, breast cancer (2013), OA, polymyalgia rheumatica, R-TKA 2021, afib, HLD, RA, Required pressors and heparin for possible PE.   OT comments  Treatment focused on sitting edge of bed for bathing task and monitoring vitals to assess activity tolerance. Vitals stable with patient complaining of mild "whooziness" and fatigue with limited activity. Orders for no knee ROM and KI at this time. Will continue POC to progress patient.    Recommendations for follow up therapy are one component of a multi-disciplinary discharge planning process, led by the attending physician.  Recommendations may be updated based on patient status, additional functional criteria and insurance authorization.    Follow Up Recommendations  Skilled nursing-short term rehab (<3 hours/day)    Assistance Recommended at Discharge Frequent or constant Supervision/Assistance  Patient can return home with the following  Two people to help with walking and/or transfers;Direct supervision/assist for medications management;Help with stairs or ramp for entrance;Assist for transportation;Direct supervision/assist for financial management;Assistance with cooking/housework;A lot of help with bathing/dressing/bathroom   Equipment Recommendations  None recommended by OT    Recommendations for Other Services      Precautions / Restrictions Precautions Precautions: Fall Precaution Comments: no pillow under knee, watch BP Required Braces or Orthoses: Knee Immobilizer - Left Knee Immobilizer - Left: On at all times Restrictions Weight Bearing Restrictions: No LLE Weight Bearing: Weight bearing as tolerated Other  Position/Activity Restrictions: No Knee ROM for now          Balance Overall balance assessment: Needs assistance Sitting-balance support: No upper extremity supported, Feet supported Sitting balance-Leahy Scale: Fair                                     ADL either performed or assessed with clinical judgement   ADL Overall ADL's : Needs assistance/impaired         Upper Body Bathing: Maximal assistance;Sitting Upper Body Bathing Details (indicate cue type and reason): Max for UB Bathing task. Patient attempting to help but highly limited by hospital gown, lines and leads Lower Body Bathing: Bed level;Total assistance Lower Body Bathing Details (indicate cue type and reason): total for LB bathing                     Functional mobility during ADLs: +2 for physical assistance;Maximal assistance General ADL Comments: +2 assistance to transfer to side of bed for sitting task. Performed UB bathing task with assistance.    Extremity/Trunk Assessment Upper Extremity Assessment Upper Extremity Assessment: Generalized weakness   Lower Extremity Assessment Lower Extremity Assessment: Defer to PT evaluation        Vision Patient Visual Report: No change from baseline     Perception     Praxis      Cognition Arousal/Alertness: Awake/alert Behavior During Therapy: WFL for tasks assessed/performed Overall Cognitive Status: Within Functional Limits for tasks assessed                                 General Comments: Took extra time for correct DOB but eventually got there.  Pertinent Vitals/ Pain       Pain Assessment Pain Assessment: No/denies pain   Progress Toward Goals  OT Goals(current goals can now be found in the care plan section)  Progress towards OT goals: Progressing toward goals  Acute Rehab OT Goals Patient Stated Goal: get out of bed soon OT Goal Formulation: With patient Time For Goal  Achievement: 03/08/22 Potential to Achieve Goals: Good  Plan Discharge plan remains appropriate    Co-evaluation      Reason for Co-Treatment: Complexity of the patient's impairments (multi-system involvement);For patient/therapist safety   OT goals addressed during session: ADL's and self-care      AM-PAC OT "6 Clicks" Daily Activity     Outcome Measure   Help from another person eating meals?: A Little Help from another person taking care of personal grooming?: A Lot Help from another person toileting, which includes using toliet, bedpan, or urinal?: Total Help from another person bathing (including washing, rinsing, drying)?: A Lot Help from another person to put on and taking off regular upper body clothing?: A Lot Help from another person to put on and taking off regular lower body clothing?: Total 6 Click Score: 11    End of Session    OT Visit Diagnosis: Unsteadiness on feet (R26.81);Other abnormalities of gait and mobility (R26.89);Pain   Activity Tolerance Patient tolerated treatment well   Patient Left in bed;with call bell/phone within reach;with nursing/sitter in room   Nurse Communication  (okay to see)        Time: 2111-7356 OT Time Calculation (min): 25 min  Charges: OT General Charges $OT Visit: 1 Visit OT Treatments $Self Care/Home Management : 8-22 mins  [name], OTR/L [place]  Office [phone]   [name] 02/24/2022, 11:08 AM

## 2022-02-24 NOTE — Progress Notes (Signed)
Pharmacy Antibiotic Note  [name] is a 88 y.o. female admitted on 02/17/2022 for left TKA.  Yesterday, Pharmacy was consulted for cefepime and vancomycin dosing for sepsis/HCAP.  Today, patient is afebrile, WBC remains elevated, serum creatinine down to 1.06 and estimated creatinine clearance improved to 32 ml/min.    MRSA PCR not detected and vancomycin discontinued per Pulmonology.  Plan: Increase cefepime to 2 g IV every 12 hours Monitor clinical progress & renal function F/U C&S, abx deescalation / LOT   Height: '5\' 1"'$  (154.9 cm) Weight: 73.5 kg (162 lb 0.6 oz) IBW/kg (Calculated) : 47.8  Temp (24hrs), Avg:97.6 F (36.4 C), Min:97.4 F (36.3 C), Max:98 F (36.7 C)  Recent Labs  Lab 02/20/22 1528 02/20/22 1955 02/21/22 0627 02/21/22 1839 02/22/22 0325 02/22/22 0742 02/23/22 0339 02/23/22 0929 02/24/22 0316  WBC 28.4*  --  22.1*  --  25.1*  --   --  28.2* 31.4*  CREATININE 0.47   < > 0.54 0.54  --  1.19* 1.91*  --  1.06*   < > = values in this interval not displayed.    Estimated Creatinine Clearance: 32.4 mL/min (A) (by C-G formula based on SCr of 1.06 mg/dL (H)).    Allergies  Allergen Reactions   Sulfa Antibiotics Nausea Only   Penicillins Swelling    Tolerated Cephalosporin 01/04/20      Simvastatin Swelling    Antimicrobials this admission: 9/13 cefazolin >> 9/14 9/19 vancomycin >> 9/20 9/19 Flagyl >> 9/19 cefepime >>  Dose adjustments this admission: 9/20 Increase frequency of cefepime from q24h to q12h  Microbiology results: 9/16 BCx: NGTD 9/19 UCx: sent  9/19 MRSA PCR: not detected  Thank you for allowing pharmacy to be a part of this patient's care.  Royetta Asal, PharmD, BCPS Clinical Pharmacist [place] Please utilize Amion for appropriate phone number to reach the unit pharmacist ([place]) 02/24/2022 8:43 AM

## 2022-02-24 NOTE — Progress Notes (Signed)
[facility] ADULT ICU REPLACEMENT PROTOCOL   The patient does apply for the [facility] Adult ICU Electrolyte Replacment Protocol based on the criteria listed below:   1.Exclusion criteria: TCTS patients, ECMO patients, and Dialysis patients 2. Is GFR >/= 30 ml/min? Yes.    Patient's GFR today is 50 3. Is SCr </= 2? Yes.   Patient's SCr is 1.06 mg/dL 4. Did SCr increase >/= 0.5 in 24 hours? No. 5.Pt's weight >40kg  Yes.   6. Abnormal electrolyte(s): K+ 3.4  7. Electrolytes replaced per protocol 8.  Call MD STAT for K+ </= 2.5, Phos </= 1, or Mag </= 1 Physician:  Bethann Humble.  Margaret Pyle 02/24/2022 5:38 AM

## 2022-02-24 NOTE — Progress Notes (Signed)
Physical Therapy Treatment Patient Details Name: [name] MRN: [id number] DOB: [birth year] Today's Date: 02/24/2022   History of Present Illness Patient is a 88 y.o. female s/p L-TKA on 02/17/22. patient was noted to trasnfer to sepdown unit with crital value for sodium. PMH significant for seizures, HTN, GERD, breast cancer (2013), OA, polymyalgia rheumatica, R-TKA 2021, afib, HLD, RA, Required pressors and heparin for possible PE.    PT Comments     Per RN, patient not on pressors, is on Heparin. The patient resting in bed, on 2 L Lafe. SPO2 95%, HR 96, BP 129/36.  Assisted patient to sitting on bed edge with  max assistance of  2. Both feet supported, Left KI in place.  Patient tolerated sitting for ~10 minutes.  BP taken several times - 164/72 , after return to supine, 126/58. HR up to 119, SPO2 > 93%.  Patient reported mild dizziness initially which subsided.  Patient assisted back into bed.  Plans  for  progressive mobility  as BP remains stable.   No ROM to left knee per ortho.    Recommendations for follow up therapy are one component of a multi-disciplinary discharge planning process, led by the attending physician.  Recommendations may be updated based on patient status, additional functional criteria and insurance authorization.  Follow Up Recommendations  Skilled nursing-short term rehab (<3 hours/day) Can patient physically be transported by private vehicle: No   Assistance Recommended at Discharge Frequent or constant Supervision/Assistance  Patient can return home with the following Two people to help with walking and/or transfers;Two people to help with bathing/dressing/bathroom;Assistance with cooking/housework;Direct supervision/assist for medications management;Direct supervision/assist for financial management;Assist for transportation;Help with stairs or ramp for entrance   Equipment Recommendations  None recommended by PT    Recommendations for Other  Services       Precautions / Restrictions Precautions Precautions: Fall Precaution Comments: noROM to L knee, watch BP Required Braces or Orthoses: Knee Immobilizer - Left Knee Immobilizer - Left: On at all times Restrictions Weight Bearing Restrictions: No LLE Weight Bearing: Weight bearing as tolerated Other Position/Activity Restrictions: No Knee ROM for now     Mobility  Bed Mobility   Bed Mobility: Supine to Sit, Sit to Supine, Rolling     Supine to sit: Max assist, +2 for safety/equipment, +2 for physical assistance Sit to supine: Max assist, +2 for safety/equipment, +2 for physical assistance, Total assist   General bed mobility comments: Patient assisted  legs  towards EOB, assist with trunk to sit upright. Total assist with legs and trunk to return to supine    Transfers deferred                        Ambulation/Gait                   Stairs             Wheelchair Mobility    Modified Rankin (Stroke Patients Only)       Balance Overall balance assessment: Needs assistance Sitting-balance support: Bilateral upper extremity supported, No upper extremity supported, Feet supported Sitting balance-Leahy Scale: Fair Sitting balance - Comments: patient sat for ~ 15 "  on bed edge                                    Cognition Arousal/Alertness: Awake/alert Behavior During Therapy: [facility] for tasks assessed/performed  Overall Cognitive Status: Within Functional Limits for tasks assessed                                 General Comments: Took extra time for correct DOB but eventually got there.        Exercises      General Comments        Pertinent Vitals/Pain      Home Living                          Prior Function            PT Goals (current goals can now be found in the care plan section) Progress towards PT goals: Progressing toward goals    Frequency    7X/week       PT Plan Current plan remains appropriate    Co-evaluation PT/OT/SLP Co-Evaluation/Treatment: Yes Reason for Co-Treatment: For patient/therapist safety;Complexity of the patient's impairments (multi-system involvement) PT goals addressed during session: Mobility/safety with mobility OT goals addressed during session: ADL's and self-care      AM-PAC PT "6 Clicks" Mobility   Outcome Measure  Help needed turning from your back to your side while in a flat bed without using bedrails?: Total Help needed moving from lying on your back to sitting on the side of a flat bed without using bedrails?: Total Help needed moving to and from a bed to a chair (including a wheelchair)?: Total Help needed standing up from a chair using your arms (e.g., wheelchair or bedside chair)?: Total Help needed to walk in hospital room?: Total Help needed climbing 3-5 steps with a railing? : Total 6 Click Score: 6    End of Session   Activity Tolerance: Patient tolerated treatment well Patient left: in bed;with call bell/phone within reach;with nursing/sitter in room Nurse Communication: Mobility status PT Visit Diagnosis: Difficulty in walking, not elsewhere classified (R26.2);Unsteadiness on feet (R26.81);Dizziness and giddiness (R42)     Time: 1036-1100 PT Time Calculation (min) (ACUTE ONLY): 24 min  Charges:  $Therapeutic Activity: 8-22 mins                     [place] Office [phone] Weekend [phone]    Claretha Cooper 02/24/2022, 12:25 PM

## 2022-02-24 NOTE — Progress Notes (Signed)
Rounding Note    Patient Name: [name] Date of Encounter: 02/24/2022  [place] Cardiologist: None   Subjective   Patient denies chest pain, palpitations, sob.   Inpatient Medications    Scheduled Meds:  Chlorhexidine Gluconate Cloth  6 each Topical Daily   ezetimibe  10 mg Oral Daily   feeding supplement  237 mL Oral BID BM   methylPREDNISolone (SOLU-MEDROL) injection  40 mg Intravenous Daily   midodrine  10 mg Oral TID WC   pantoprazole  40 mg Oral Daily   PHENobarbital  97.2 mg Oral QHS   Continuous Infusions:  sodium chloride 50 mL/hr at 02/24/22 1000   sodium chloride     amiodarone 30 mg/hr (02/24/22 1000)   ceFEPime (MAXIPIME) IV     heparin 1,050 Units/hr (02/24/22 1000)   methocarbamol (ROBAXIN) IV     norepinephrine (LEVOPHED) Adult infusion Stopped (02/24/22 0729)   PRN Meds: acetaminophen, alum & mag hydroxide-simeth, bisacodyl, diphenhydrAMINE, HYDROmorphone (DILAUDID) injection, magnesium citrate, methocarbamol **OR** methocarbamol (ROBAXIN) IV, ondansetron **OR** ondansetron (ZOFRAN) IV, mouth rinse, oxyCODONE, oxyCODONE, senna-docusate, traMADol   Vital Signs    Vitals:   02/24/22 0600 02/24/22 0700 02/24/22 0800 02/24/22 0900  BP: (!) 136/50 (!) 164/49 (!) 126/54 (!) 120/39  Pulse: 86 87 (!) 105 (!) 110  Resp: 20 (!) 21 (!) 27 (!) 32  Temp:   98 F (36.7 C)   TempSrc:   Oral   SpO2: 94% 95% 92% (!) 89%  Weight:      Height:        Intake/Output Summary (Last 24 hours) at 02/24/2022 1026 Last data filed at 02/24/2022 1000 Gross per 24 hour  Intake 2321.07 ml  Output 4555 ml  Net -2233.93 ml      02/24/2022    5:00 AM 02/22/2022    4:50 PM 02/20/2022    6:29 PM  Last 3 Weights  Weight (lbs) 162 lb 0.6 oz 165 lb 2 oz 160 lb 15 oz  Weight (kg) 73.5 kg 74.9 kg 73 kg      Telemetry    Sinus rhythm with PACs, PVCs. HR was in the 60s overnight, now up to the 90s-100s- Personally Reviewed  ECG    No new tracings -  Personally Reviewed  Physical Exam   GEN: No acute distress.  Resting comfortably in the bed using incentive spirometer  Neck: No JVD Cardiac: RRR, mildly tachycardia, Faint systolic murmur. Radial pulses 2+ bilaterally.  Respiratory: Clear to auscultation bilaterally. GI: Soft, nontender, non-distended  MS: L leg with bruises, trace edema. R leg without edema  Neuro:  Nonfocal  Psych: Normal affect   Labs    High Sensitivity Troponin:  No results for input(s): "TROPONINIHS" in the last 720 hours.   Chemistry Recent Labs  Lab 02/22/22 0742 02/22/22 1113 02/22/22 2342 02/23/22 0339 02/24/22 0316  NA 131*   < > 128* 131* 137  K 4.2  --   --  4.3 3.4*  CL 103  --   --  101 107  CO2 19*  --   --  19* 21*  GLUCOSE 127*  --   --  119* 145*  BUN 25*  --   --  40* 33*  CREATININE 1.19*  --   --  1.91* 1.06*  CALCIUM 8.2*  --   --  8.4* 8.2*  MG  --   --   --  2.2 2.4  PROT  --   --   --  5.6* 5.4*  ALBUMIN  --   --   --  2.7* 2.6*  AST  --   --   --  26 22  ALT  --   --   --  22 24  ALKPHOS  --   --   --  73 81  BILITOT  --   --   --  1.6* 1.2  GFRNONAA 43*  --   --  25* 50*  ANIONGAP 9  --   --  11 9   < > = values in this interval not displayed.    Lipids No results for input(s): "CHOL", "TRIG", "HDL", "LABVLDL", "LDLCALC", "CHOLHDL" in the last 168 hours.  Hematology Recent Labs  Lab 02/22/22 0325 02/23/22 0929 02/24/22 0316  WBC 25.1* 28.2* 31.4*  RBC 3.48* 3.19* 3.02*  HGB 10.8* 9.9* 9.3*  HCT 32.3* 31.3* 28.2*  MCV 92.8 98.1 93.4  MCH 31.0 31.0 30.8  MCHC 33.4 31.6 33.0  RDW 15.0 15.9* 15.8*  PLT 216 222 264   Thyroid  Recent Labs  Lab 02/23/22 1259  TSH 2.009    BNP Recent Labs  Lab 02/21/22 1052 02/23/22 0929  BNP 218.1* 339.3*    DDimer No results for input(s): "DDIMER" in the last 168 hours.   Radiology    US RENAL  Result Date: 02/23/2022 CLINICAL DATA:  Acute renal insufficiency EXAM: RENAL / URINARY TRACT ULTRASOUND COMPLETE  COMPARISON:  12/24/2021 FINDINGS: Right Kidney: Renal measurements: 11.2 x 4.1 x 4.4 cm = volume: 104.8 mL. There are 2 renal cortical cysts, measuring 1.0 and 1.2 cm respectively. Extrarenal pelvis again noted. No hydronephrosis or renal mass. Normal renal cortical echotexture. Left Kidney: Renal measurements: 11.0 x 5.0 x 5.1 cm = volume: 146.4 mL. Echogenicity within normal limits. No mass or hydronephrosis visualized. Bladder: Bladder is decompressed with a Foley catheter. Other: None. IMPRESSION: 1. Grossly unremarkable appearance of the bilateral kidneys. Stable extrarenal pelvis right kidney without hydronephrosis. Electronically Signed   By: Randa Ngo M.D.   On: 02/23/2022 20:09   VAS [place] LOWER EXTREMITY VENOUS (DVT)  Result Date: 02/23/2022  Lower Venous DVT Study Patient Name:  [name]  Date of Exam:   02/23/2022 Medical Rec #: [id number]          Accession #:    [id number] Date of Birth: [birth year]          Patient Gender: F Patient Age:   88 years Exam Location:  [facility] Procedure:      VAS [place] LOWER EXTREMITY VENOUS (DVT) Referring Phys: Montey Hora --------------------------------------------------------------------------------  Indications: Hypoxia.  Risk Factors: Surgery. Limitations: Body habitus, poor ultrasound/tissue interface and patient positioning, patient immobility. Comparison Study: No prior studies. Performing Technologist: Oliver Hum RVT  Examination Guidelines: A complete evaluation includes B-mode imaging, spectral Doppler, color Doppler, and power Doppler as needed of all accessible portions of each vessel. Bilateral testing is considered an integral part of a complete examination. Limited examinations for reoccurring indications may be performed as noted. The reflux portion of the exam is performed with the patient in reverse Trendelenburg.  +---------+---------------+---------+-----------+----------+--------------+ RIGHT     CompressibilityPhasicitySpontaneityPropertiesThrombus Aging +---------+---------------+---------+-----------+----------+--------------+ CFV      Full           Yes      No                                  +---------+---------------+---------+-----------+----------+--------------+  SFJ      Full                                                        +---------+---------------+---------+-----------+----------+--------------+ FV Prox  Full                                                        +---------+---------------+---------+-----------+----------+--------------+ FV Mid   Full                                                        +---------+---------------+---------+-----------+----------+--------------+ FV DistalFull           Yes      No                                  +---------+---------------+---------+-----------+----------+--------------+ PFV      Full                                                        +---------+---------------+---------+-----------+----------+--------------+ POP      Full           Yes      Yes                                 +---------+---------------+---------+-----------+----------+--------------+ PTV      Full                                                        +---------+---------------+---------+-----------+----------+--------------+ PERO     Full                                                        +---------+---------------+---------+-----------+----------+--------------+   +---------+---------------+---------+-----------+----------+-------------------+ LEFT     CompressibilityPhasicitySpontaneityPropertiesThrombus Aging      +---------+---------------+---------+-----------+----------+-------------------+ CFV      Full           Yes      No                                       +---------+---------------+---------+-----------+----------+-------------------+ SFJ      Full                                                              +---------+---------------+---------+-----------+----------+-------------------+  FV Prox  Full                                                             +---------+---------------+---------+-----------+----------+-------------------+ FV Mid   Full                                                             +---------+---------------+---------+-----------+----------+-------------------+ FV Distal               Yes      Yes                                      +---------+---------------+---------+-----------+----------+-------------------+ PFV      Full                                                             +---------+---------------+---------+-----------+----------+-------------------+ POP      Full           Yes      Yes                                      +---------+---------------+---------+-----------+----------+-------------------+ PTV      Full                                                             +---------+---------------+---------+-----------+----------+-------------------+ PERO                                                  Not well visualized +---------+---------------+---------+-----------+----------+-------------------+     Summary: RIGHT: - There is no evidence of deep vein thrombosis in the lower extremity. However, portions of this examination were limited- see technologist comments above.  - No cystic structure found in the popliteal fossa.  LEFT: - There is no evidence of deep vein thrombosis in the lower extremity. However, portions of this examination were limited- see technologist comments above.  - No cystic structure found in the popliteal fossa.  *See table(s) above for measurements and observations. Electronically signed by Deitra Mayo MD on 02/23/2022 at 5:23:37 PM.    Final    DG CHEST PORT 1 VIEW  Result Date: 02/22/2022 CLINICAL DATA:  Hyponatremia.  Acute kidney  injury. EXAM: PORTABLE CHEST 1 VIEW COMPARISON:  Radiographs 02/20/2022 and 07/29/2021.  CT 05/15/2021. FINDINGS: 1403 hours. The patient is mildly rotated to the left. Allowing for this, the heart size and mediastinal contours are stable.  There is new retrocardiac airspace disease with partial obscuration of the left hemidiaphragm. Stable linear atelectasis or scarring at the right lung base. Possible small left pleural effusion. No evidence of pneumothorax or acute osseous abnormality. Telemetry leads overlie the chest. IMPRESSION: New left lower lobe atelectasis or early infiltrate. Recommend radiographic follow-up. Electronically Signed   By: Richardean Sale M.D.   On: 02/22/2022 15:38    Cardiac Studies   Echocardiogram 02/21/2022 1. Left ventricular ejection fraction, by estimation, is 60 to 65%. The  left ventricle has normal function. The left ventricle has no regional  wall motion abnormalities. Left ventricular diastolic parameters are  indeterminate. Elevated left ventricular  end-diastolic pressure.   2. Right ventricular systolic function is normal. The right ventricular  size is normal. There is moderately elevated pulmonary artery systolic  pressure. The estimated right ventricular systolic pressure is 65.0 mmHg.   3. Left atrial size was moderately dilated.   4. The mitral valve is abnormal. Moderate mitral valve regurgitation.  Moderate mitral annular calcification.   5. The aortic valve is tricuspid. There is moderate calcification of the  aortic valve. Aortic valve regurgitation is not visualized. Aortic valve  sclerosis/calcification is present, without any evidence of aortic  stenosis.   6. The inferior vena cava is normal in size with greater than 50%  respiratory variability, suggesting right atrial pressure of 3 mmHg.   7. Echodensity seen in right atrium likely free wall and trabeculation  visualized out of plane.   Comparison(s): No prior Echocardiogram.   Patient  Profile     88 y.o. female  with a hx of paroxysmal atrial fibrillation, Hypertension, moderate mitral valve regurgitation, hyperlipidemia, osteoarthritis, polymyalgia rheumatica, history of seizures/epilepsy who is being seen for the evaluation of paroxysmal atrial fibrillation at the request of Dr [name].  Assessment & Plan    Paroxysmal atrial fibrillation - Patient had a brief episode of afib earlier this admission, but she had converted to NSR after being given a dose of IV diltiazem.  - Had another episode of Afib yesterday with RVR. Started IV amiodarone, note patient became hypotensive after starting. Suspect patient may have been sensitive to IV amio formulation. Appears she is still on amio drip  - Patient now in NSR. HR well controlled overnight, currently mildly tachycardic with HR in the 90s-100s  - Transition from IV amiodarone to PO 200 mg BID  - When BP more stable, resume home metoprolol  - CHADS-VASc 4 (agex2, gender, HTN)  - On IV heparin for now, transition to [place] closer to DC  - Echocardiogram this admission showed EF 60-65%, elevated LVEDP, normal RV systolic function, moderately dilated LA,moderate mitral valve regurgitation   Normocytic anemia - Likely blood loss anemia postoperatively  - Hemoglobin was down to 7.9, improved to 10.8 after patient received 2 units PRBC  - Hemoglobin stable at 9.3 today    Symptomatic Hyponatremia  AKI  - Defer to Nephrology and IM - Na improved to 131 today  - Note that patient has been receiving IV fluids this admission. Attempted IV lasix, but patient's BP dropped so it was held. Patient had a foley placed last night, output 2.6 L urine overnight. Now we are alowing patient to auto-diurese   Otherwise, per primary -Polymyalgia rheumatica -History of seizures/epilepsy -History of chronic arthritis in left knee, s/p total knee arthroplasty      For questions or updates, please contact [place] Please consult  [email or web address] for contact info under  Signed, Margie Billet, PA-C  02/24/2022, 10:26 AM

## 2022-02-24 NOTE — Progress Notes (Signed)
NAME:  [name], MRN:  [id number], DOB:  May 29, 1930, LOS: 3 ADMISSION DATE:  02/17/2022, CONSULTATION DATE:  02/23/22 REFERRING MD:  Cruzita Lederer CHIEF COMPLAINT:  hypotension   History of Present Illness:  [name] is a 88 year old female who underwent a L TKA on 9/13.  On 9/16, she was noted to have altered mental status with hypersomnolence. Labs revealed an acute drop in her sodium at 116, previously 136.  Hospital medicine and nephrology were consulted and she was started on 3% NS.  On 9/17 she was noted to be in Afib with RVR.  Cardiology was consulted.  She was given Cardizem and converted to NSR.    On 9/18, her chest xray was concerning for LLL atelectasis vs infiltrate.  She was started on Vanc/Cefepime/Flagyl.    On 9/19, she went back into afib with RVR and was started on an amiodarone bolus and infusion by cardiology. She developed hypotension after the amiodarone was started which resolved temporarily with a fluid bolus.  She was started on low dose Norepinephrine and stress dose steroids by PCCM. She was also started on heparin infusion for PE prophylaxis, DVT neg on [place].   Pertinent  Medical History  OA of R knee and L knee Acute metabolic encephalopathy Acute hyponatremia Hypotension PAF with RVR Polymyalgia rheumatica  Significant Hospital Events: Including procedures, antibiotic start and stop dates in addition to other pertinent events   9/13 admitted for L TKA 9/16 Acute hyponatremia, started on 3% NS 9/17 AF with RVR, converted with Cardizem 9/18 CXR c/f LLL infiltrate vs atelectasis, started on Vanc/Cefepime/Flagyl 9/19 AF with RVR, started on amiodarone infusion, developed hypotension, started on low dose NE  Interim History / Subjective:  SBP stable in 120-150s, Levophed off this morning. Awake and alert this morning. State she feels better but still no energy.  Not eating much of her meals   Afebrile  BP stable off levophed  I/O 4.5L UOP, -2L in  last 24 hours  Objective   Blood pressure (!) 136/50, pulse 86, temperature 98 F (36.7 C), temperature source Oral, resp. rate 20, height '5\' 1"'$  (1.549 m), weight 73.5 kg, SpO2 94 %.       Intake/Output Summary (Last 24 hours) at 02/24/2022 0845 Last data filed at 02/24/2022 2637 Gross per 24 hour  Intake 2640.07 ml  Output 4680 ml  Net -2039.93 ml   Filed Weights   02/20/22 1829 02/22/22 1650 02/24/22 0500  Weight: 73 kg 74.9 kg 73.5 kg    Examination: General: elderly female, in NAD, awake and resting in bed / working with PT HENT: MM pink/moist, anicteric, EOMI, PERRL Lungs: non-labored at rest, weaned off O2, clear to auscultation bilaterally  Cardiovascular: Regular rhythm, tachycardia, no murmurs/rubs/thrills Abdomen: Normoactive bowel sounds in all 4 quadrants, soft and non-distended. Passing gas Extremities: No edema, LLE in brace with limited movement, LLE ecchymosis Neuro: nonfocal, alert and awake, MAE GU: foley in place  Resolved Hospital Problem list     Assessment & Plan:   Hypotension - resolved Likely result of Amiodarone infusion d/t the timing.  Potential HCAP contributing. -follow BP trend  -continue midodrine TID, wean to off  -Amiodarone restarted yesterday 9/19 -discontinue levophed from [facility] -Continue gentle fluids and supportive care -Start on Solumedrol in lieu of home 5 mg prednisone for hx of PMR, will taper down 9/21 to 20 mg   Paroxysmal Atrial Fibrillation Hx HTN, MVR, HTN, HLD Initially given Cardizem but converted and later  started on amiodarone, subsequently developed hypotension.  ST on monitor morning 9/20 -tele monitoring  -goal K+>4, Mg+>2  -appreciate Cardiology  -continue heparin infusion, consider transition to [place] in 24-48h   HCAP vs Atelectasis Acute Hypoxic Respiratory Failure CXR c/f LLL infiltrate vs atelectasis.  Newly required O2 via [place], since off and on RA.  DVT r/o. CTA not feasible d/t AKI to r/o PE.  Started on  heparin infusion for Afib. -continue cefepime  -suspect hypoxia driven by atelectasis, post surgical.  Less likely PE -D2/x abx   AKI Hyponatremia Improved, Na 137 today, no longer on 3% NS as of 9/18 -appreciate Nephrology  -Trend BMP / urinary output -NS at 9m/hr  -Replace electrolytes as indicated -Avoid nephrotoxic agents, ensure adequate renal perfusion  Acute Urinary Retention  -consider removal of foley trial 9/20 -follow up bladder scan   Acute Metabolic Encephalopathy Likely related to hypotension, hyponatremia > as BP improved so did mental status.   -supportive care -limit sedating medications  -delirium precautions   Hx Seizures -continue home phenobarbital   Post op L TKA -post op mgmt per Ortho  -bowel regimen -reduce pain regimen > limited use, encephalopathy risk   Goals of Care -DNR   Best Practice (right click and "Reselect all SmartList Selections" daily)  Diet/type: Regular consistency (see orders) DVT prophylaxis: systemic heparin GI prophylaxis: N/A Lines: N/A Foley:  Yes, and it is still needed Code Status:  DNR Last date of multidisciplinary goals of care discussion: 9/19   Family updated at bedside 9/20 per Dr. MVerlee Monte  Patient updated on plan of care on exam.   [facility]M will be available PRN.   Critical care time: n/a   JDaisy Blossom AGACNP-S MAlma MSN, APRN, NP-C, AGACNP-BC [facility] 02/24/2022, 12:13 PM   Please see [email or web address] for pager details.   From 7A-7P if no response, please call [phone] After hours, please call ELink [phone]

## 2022-02-24 NOTE — Progress Notes (Signed)
ANTICOAGULATION CONSULT NOTE  Pharmacy Consult for Heparin + Vanco/Cefepime Indication:  Afib and r/o PE + Sepsis/PNA  Allergies  Allergen Reactions   Sulfa Antibiotics Nausea Only   Penicillins Swelling    Tolerated Cephalosporin 01/04/20      Simvastatin Swelling    Patient Measurements: Height: '5\' 1"'$  (154.9 cm) Weight: 74.9 kg (165 lb 2 oz) IBW/kg (Calculated) : 47.8 Heparin Dosing Weight: 65 kg  Vital Signs: Temp: 97.4 F (36.3 C) (09/20 0355) Temp Source: Oral (09/20 0355) BP: 115/36 (09/19 2300) Pulse Rate: 69 (09/19 2300)  Labs: Recent Labs    02/22/22 0325 02/22/22 0742 02/23/22 0339 02/23/22 0929 02/23/22 1930 02/24/22 0316  HGB 10.8*  --   --  9.9*  --  9.3*  HCT 32.3*  --   --  31.3*  --  28.2*  PLT 216  --   --  222  --  264  HEPARINUNFRC  --   --   --   --  0.51 0.67  CREATININE  --  1.19* 1.91*  --   --  1.06*     Estimated Creatinine Clearance: 32.6 mL/min (A) (by C-G formula based on SCr of 1.06 mg/dL (H)).  Assessment: Active Problem(s): 9/13: s/p TKR   PMH: hypertension, GERD, arthritis, polymyalgia rheumatica, seizures   Assessment 73 yoF with PMH PAF not on anticoag, who experienced AFib/RVR after TKA. Also with hypoxia likely d/t PNA, but CCM would like to rule out PE as well. Pharmacy consulted to dose IV heparin.  Today, 02/24/2022: Hgb low but stable; Plt stable WNL Heparin level 0.67 therapeutic on 1050 units/hr SCr improved No bleeding or infusion issues noted  Goal of Therapy:  Heparin level 0.3-0.7 units/ml Monitor platelets by anticoagulation protocol: Yes   Plan:  Continue heparin at 1050 units/hr Daily HL and CBC F/u plans for long-term anticoagulation   Dolly Rias RPh 02/24/2022, 4:21 AM

## 2022-02-24 NOTE — Progress Notes (Signed)
NAME:  [name], MRN:  [id number], DOB:  06-15-1929, LOS: 3 ADMISSION DATE:  02/17/2022, CONSULTATION DATE:  02/23/22 REFERRING MD:  Cruzita Lederer CHIEF COMPLAINT:  Hypotension   History of Present Illness:  [name] is a 88 y.o. female who has a PMH as below. She underwent L TKA 9/13. Post op, on 9/16 she was noted to have a change in mental status with hypersomnolence. Labs demonstrated an acute drop in sodium to 116 (was 136 on 02/05/22). Hospitalists and nephrology were consulted and she was started on 3% NS. 9/17 she developed A.fib with RVR for which cardiology was consulted. She was started on Cardizem and converted to NSR. 9/18 CXR showed concern for LLL atelectasis vs infiltrate for which she was started on Vanc/Cefepime/Flagyl. On 9/19, she had a recurrence of AFRVR with rates up into the 150s and was then started on Amiodarone bolus and infusion. Shortly after Amiodarone was started, her blood pressure began to drop. She was given a fluid bolus which improved pressures temporarily before they dropped again. She was ultimately started on low dose Norepinephrine and PCCM was asked to assist with her care.  At the time of my evaluation, she was on 41mg NE and SBP was up to 128. NE was titrated down to 335m and I asked RN to continue to titrate down based off BP and mental status. Pt is currently hypersomnolent but does arouse to voice and tries to speak. She is clearly more altered compared to this morning per her daughter however who claims that she was conversant and joking around with family at that time.  Pertinent  Medical History:  has Osteoarthritis of right knee; Osteoarthritis of left knee; Acute metabolic encephalopathy; Acute hyponatremia; HCAP (healthcare-associated pneumonia); [place] (acute kidney injury) ([facility] Hypotension; Paroxysmal atrial fibrillation with RVR ([facility] PMR (polymyalgia rheumatica) ([facility] [place] and DNR (do not resuscitate) on their problem list.  Significant  Hospital Events: Including procedures, antibiotic start and stop dates in addition to other pertinent events   9/13 admit for L TKA 9/16 Hyponatremia, started on 3% NS 9/17 AFRVR, converted to NSR with Diltiazem 9/18 CXR with LLL infiltrate vs ATX, started on Vanc/Cefepime/Flagyl 9/19 recurrence of AFRVR, started on Amio bolus+infusion. Later developed hypotension, started on low dose NE  Interim History / Subjective:  Levo weaned off this morning  Objective:  Blood pressure (!) 136/50, pulse 86, temperature 98 F (36.7 C), temperature source Oral, resp. rate 20, height '5\' 1"'$  (1.549 m), weight 73.5 kg, SpO2 94 %.        Intake/Output Summary (Last 24 hours) at 02/24/2022 0913 Last data filed at 02/24/2022 [id number] per 24 hour  Intake 2640.07 ml  Output 4680 ml  Net -2039.93 ml   Filed Weights   02/20/22 1829 02/22/22 1650 02/24/22 0500  Weight: 73 kg 74.9 kg 73.5 kg    Examination: General appearance: [age].o., female, NAD, frail Eyes: PERRL, tracking appropriately HENT: NCAT; dry MM Lungs: diminished bl, with normal respiratory effort CV: IRIR, no murmur  Abdomen: Soft, non-tender; non-distended, BS present  Extremities: LLE in brace Neuro: grossly nonfocal    Labs/imaging personally reviewed:  BUN/S Cr trending down Na 137 WBC 31 MRSA nare swab neg UCx, BCx 9/19   Assessment & Plan:   Hypotension  Effect of IV amio solvent, relative adrenal insufficiency, early sepsis physiology from HAP possible contributors. Question also of PE/FES.  - amio infusion per cards - continue gentle MIVF and supportive care - continue stress dose  solumedrol for at least another day before tapering back to home dose - if continued renal recovery consideration of CTA Chest - continue cefepime tentative 5d course  A.fib with RVR with underlying hx of PAF Hx PAF, HTN, MVR, HLD. - correct electrolytes - cardiology following - remains on amio, heparin gtt  Acute hypoxic  respiratory insufficiency HAP vs atelectasis. As above, possibility of PE/FES. - Continue Heparin. - if continued renal recovery consideration of CTA Chest - PT/IS  AKI Hyponatremia - multifactorial, improved after 3% NS which is now off. - Nephrology following, appreciate the assistance.  Acute encephalopathy - seems mostly related to hypotension. Other possibilities include ICU delirium, less likely FES, NCSE. - Continue supportive care. - Avoid sedating meds.  Hx Seizures - Continue phenobarbital.  L TKA - Post op care per ortho.  Goals of Care - DNR per discussions with TRH and family.  Best practice (evaluated daily):  Diet/type: Regular consistency (see orders) DVT prophylaxis: systemic heparin GI prophylaxis: N/A Lines: N/A Foley:  Yes, and it is still needed Code Status:  DNR Last date of multidisciplinary goals of care discussion: Daughter updated at bedside 9/20.  Critical care time: [place]  For pager details, please see AMION or use Epic chat  After 1900, please call [facility] for cross coverage needs 02/24/2022, 9:13 AM

## 2022-02-24 NOTE — Progress Notes (Signed)
Subjective: 7 Days Post-Op Procedure(s) (LRB): TOTAL KNEE ARTHROPLASTY (Left) Patient seen in rounds for Dr. [name] Patient reports pain as mild.   Patient is alert and oriented this am when answering my questions Daughter is bedside Reports minimal pain in her knee No events overnight Holding knee ROM at this time due to anticoagulation   Objective: Vital signs in last 24 hours: Temp:  [97.4 F (36.3 C)-98.3 F (36.8 C)] 97.4 F (36.3 C) (09/20 0355) Pulse Rate:  [58-140] 86 (09/20 0600) Resp:  [14-31] 20 (09/20 0600) BP: (73-168)/(25-80) 136/50 (09/20 0600) SpO2:  [82 %-98 %] 94 % (09/20 0600) Weight:  [73.5 kg] 73.5 kg (09/20 0500)  Intake/Output from previous day: 09/19 0701 - 09/20 0700 In: 2477.4 [P.O.:350; I.V.:1591.4; IV Piggyback:536] Out: 6808 [Urine:4550] Intake/Output this shift: No intake/output data recorded.  Recent Labs    02/22/22 0325 02/23/22 0929 02/24/22 0316  HGB 10.8* 9.9* 9.3*   Recent Labs    02/23/22 0929 02/24/22 0316  WBC 28.2* 31.4*  RBC 3.19* 3.02*  HCT 31.3* 28.2*  PLT 222 264   Recent Labs    02/23/22 0339 02/24/22 0316  NA 131* 137  K 4.3 3.4*  CL 101 107  CO2 19* 21*  BUN 40* 33*  CREATININE 1.91* 1.06*  GLUCOSE 119* 145*  CALCIUM 8.4* 8.2*   No results for input(s): "LABPT", "INR" in the last 72 hours.  Alert and oriented x 4  Neurologically intact Neurovascular intact Sensation intact distally Intact pulses distally Dorsiflexion/Plantar flexion intact Incision: scant drainage, no change over the past few days Compartment soft   Assessment/Plan: 7 Days Post-Op Procedure(s) (LRB): TOTAL KNEE ARTHROPLASTY (Left) Hold knee ROM, continue in KI Encouraged continued ankle pumps  Appreciate all help from IM, Nephrology and cards    Drue Novel, PA-C Dr. Lajuana Ripple [phone] 02/24/2022, 7:33 AM

## 2022-02-25 DIAGNOSIS — E876 Hypokalemia: Secondary | ICD-10-CM

## 2022-02-25 DIAGNOSIS — N179 Acute kidney failure, unspecified: Secondary | ICD-10-CM | POA: Diagnosis not present

## 2022-02-25 LAB — RENAL FUNCTION PANEL
Albumin: 2.5 g/dL — ABNORMAL LOW (ref 3.5–5.0)
Anion gap: 8 (ref 5–15)
BUN: 19 mg/dL (ref 8–23)
CO2: 24 mmol/L (ref 22–32)
Calcium: 7.9 mg/dL — ABNORMAL LOW (ref 8.9–10.3)
Chloride: 109 mmol/L (ref 98–111)
Creatinine, Ser: 0.62 mg/dL (ref 0.44–1.00)
GFR, Estimated: >60 mL/min (ref >60)
Glucose, Bld: 103 mg/dL — ABNORMAL HIGH (ref 70–99)
Phosphorus: 1.5 mg/dL — ABNORMAL LOW (ref 2.5–4.6)
Potassium: 2.8 mmol/L — ABNORMAL LOW (ref 3.5–5.1)
Sodium: 141 mmol/L (ref 135–145)

## 2022-02-25 LAB — CBC
HCT: 27.6 % — ABNORMAL LOW (ref 36.0–46.0)
Hemoglobin: 9 g/dL — ABNORMAL LOW (ref 12.0–15.0)
MCH: 30.9 pg (ref 26.0–34.0)
MCHC: 32.6 g/dL (ref 30.0–36.0)
MCV: 94.8 fL (ref 80.0–100.0)
Platelets: 278 10*3/uL (ref 150–400)
RBC: 2.91 MIL/uL — ABNORMAL LOW (ref 3.87–5.11)
RDW: 15.9 % — ABNORMAL HIGH (ref 11.5–15.5)
WBC: 22.3 10*3/uL — ABNORMAL HIGH (ref 4.0–10.5)
nRBC: 0.1 % (ref 0.0–0.2)

## 2022-02-25 LAB — POTASSIUM: Potassium: 3.6 mmol/L (ref 3.5–5.1)

## 2022-02-25 LAB — MAGNESIUM: Magnesium: 2.1 mg/dL (ref 1.7–2.4)

## 2022-02-25 LAB — HEPARIN LEVEL (UNFRACTIONATED): Heparin Unfractionated: 0.37 IU/mL (ref 0.30–0.70)

## 2022-02-25 LAB — PHOSPHORUS: Phosphorus: 2.8 mg/dL (ref 2.5–4.6)

## 2022-02-25 MED ORDER — PREDNISONE 20 MG PO TABS
20.0000 mg | ORAL_TABLET | Freq: Every day | ORAL | Status: DC
  Administered 2022-02-25 – 2022-02-26 (×2): 20 mg via ORAL
  Filled 2022-02-25 (×2): qty 1

## 2022-02-25 MED ORDER — METOPROLOL TARTRATE 12.5 MG HALF TABLET
12.5000 mg | ORAL_TABLET | Freq: Two times a day (BID) | ORAL | Status: DC
  Administered 2022-02-25 – 2022-03-03 (×13): 12.5 mg via ORAL
  Filled 2022-02-25 (×13): qty 1

## 2022-02-25 MED ORDER — POTASSIUM CHLORIDE CRYS ER 20 MEQ PO TBCR
40.0000 meq | EXTENDED_RELEASE_TABLET | ORAL | Status: AC
  Administered 2022-02-25 (×2): 40 meq via ORAL
  Filled 2022-02-25 (×2): qty 2

## 2022-02-25 MED ORDER — SENNOSIDES-DOCUSATE SODIUM 8.6-50 MG PO TABS
1.0000 | ORAL_TABLET | Freq: Two times a day (BID) | ORAL | Status: DC
  Administered 2022-02-25: 1 via ORAL
  Filled 2022-02-25 (×2): qty 1

## 2022-02-25 MED ORDER — MIDODRINE HCL 5 MG PO TABS
5.0000 mg | ORAL_TABLET | Freq: Three times a day (TID) | ORAL | Status: DC
  Administered 2022-02-25 (×2): 5 mg via ORAL
  Filled 2022-02-25 (×3): qty 1

## 2022-02-25 MED ORDER — POTASSIUM PHOSPHATES 15 MMOLE/5ML IV SOLN
45.0000 mmol | Freq: Once | INTRAVENOUS | Status: AC
  Administered 2022-02-25: 45 mmol via INTRAVENOUS
  Filled 2022-02-25: qty 15

## 2022-02-25 MED ORDER — POLYETHYLENE GLYCOL 3350 17 G PO PACK
17.0000 g | PACK | Freq: Every day | ORAL | Status: DC
  Administered 2022-02-25: 17 g via ORAL
  Filled 2022-02-25 (×3): qty 1

## 2022-02-25 NOTE — Progress Notes (Signed)
Occupational Therapy Treatment Patient Details Name: [name] MRN: [id number] DOB: November 30, 1929 Today's Date: 02/25/2022   History of present illness Patient is a 88 y.o. female s/p L-TKA on 02/17/22. patient was noted to trasnfer to sepdown unit with crital value for sodium. PMH significant for seizures, HTN, GERD, breast cancer (2013), OA, polymyalgia rheumatica, R-TKA 2021, afib, HLD, RA, Required pressors and heparin for possible PE.   OT comments  Treatment focused on functional mobility to work towards out of bed ADLs. Patient max assist to transfer to edge of bed and mod x 2 to stand. Once on feet patient able to take steps to recliner with RW. Patient's HR immediately into 150s with standing thus limiting further activity. Will continue POC.    Recommendations for follow up therapy are one component of a multi-disciplinary discharge planning process, led by the attending physician.  Recommendations may be updated based on patient status, additional functional criteria and insurance authorization.    Follow Up Recommendations  Skilled nursing-short term rehab (<3 hours/day)    Assistance Recommended at Discharge Frequent or constant Supervision/Assistance  Patient can return home with the following  Two people to help with walking and/or transfers;Direct supervision/assist for medications management;Help with stairs or ramp for entrance;Assist for transportation;Direct supervision/assist for financial management;Assistance with cooking/housework;A lot of help with bathing/dressing/bathroom   Equipment Recommendations  Other (comment) (Defer)    Recommendations for Other Services      Precautions / Restrictions Precautions Precautions: Fall Precaution Comments: no ROM to L knee, watch BP Required Braces or Orthoses: Knee Immobilizer - Left Knee Immobilizer - Left: On at all times Restrictions Weight Bearing Restrictions: No LLE Weight Bearing: Weight bearing as  tolerated Other Position/Activity Restrictions: No Knee ROM for now       Mobility Bed Mobility Overal bed mobility: Needs Assistance Bed Mobility: Supine to Sit     Supine to sit: Mod assist, +2 for physical assistance, HOB elevated     General bed mobility comments: max assist for LLE and trunk to transfer into sitting. Use of bed pad to scoot to edge of bed.    Transfers Overall transfer level: Needs assistance Equipment used: Rolling walker (2 wheels) Transfers: Bed to chair/wheelchair/BSC, Sit to/from Stand Sit to Stand: +2 physical assistance, From elevated surface, Mod assist Stand pivot transfers: Min assist, +2 safety/equipment         General transfer comment: Mod x 2 to stand from elevated bed height with walker. Then min assist to transfer to the right to recliner. Hr up to 151 with minimal activity.     Balance Overall balance assessment: Needs assistance Sitting-balance support: No upper extremity supported Sitting balance-Leahy Scale: Fair     Standing balance support: Reliant on assistive device for balance Standing balance-Leahy Scale: Poor                              Vision Patient Visual Report: No change from baseline     Perception     Praxis      Cognition Arousal/Alertness: Awake/alert Behavior During Therapy: WFL for tasks assessed/performed Overall Cognitive Status: Within Functional Limits for tasks assessed                                 General Comments: alert x 4 today.  Pertinent Vitals/ Pain       Pain Assessment Pain Assessment: No/denies pain   Frequency  Min 2X/week        Progress Toward Goals  OT Goals(current goals can now be found in the care plan section)  Progress towards OT goals: Progressing toward goals  Acute Rehab OT Goals Patient Stated Goal: get to [facility] OT Goal Formulation: With patient Time For Goal Achievement: 03/08/22 Potential to Achieve  Goals: Good  Plan Discharge plan remains appropriate    Co-evaluation          OT goals addressed during session:  (activity tolerance)      AM-PAC OT "6 Clicks" Daily Activity     Outcome Measure   Help from another person eating meals?: A Little Help from another person taking care of personal grooming?: A Little Help from another person toileting, which includes using toliet, bedpan, or urinal?: Total Help from another person bathing (including washing, rinsing, drying)?: A Lot Help from another person to put on and taking off regular upper body clothing?: A Lot Help from another person to put on and taking off regular lower body clothing?: Total 6 Click Score: 12    End of Session Equipment Utilized During Treatment: Gait belt;Rolling walker (2 wheels)  OT Visit Diagnosis: Unsteadiness on feet (R26.81);Other abnormalities of gait and mobility (R26.89);Pain   Activity Tolerance Patient tolerated treatment well   Patient Left with call bell/phone within reach;in chair   Nurse Communication Mobility status        Time: 1040-1056 OT Time Calculation (min): 16 min  Charges: OT General Charges $OT Visit: 1 Visit OT Treatments $Therapeutic Activity: 8-22 mins  Gustavo Lah, OTR/L [place]  Office [phone]   Lenward Chancellor 02/25/2022, 3:48 PM

## 2022-02-25 NOTE — Progress Notes (Addendum)
PROGRESS NOTE   [name]  OXB:[id number]    DOB: Mar 06, 1930    DOA: 02/17/2022  PCP: [name], MD   I have briefly reviewed patients previous medical records in [facility].   Brief Narrative:  PCCM >TRH for continued medical consult 02/25/2022: 88 year old female underwent left TKA by Dr. [name] on 02/17/2022.  9/16, noted to have AMS with hypersomnolence and labs revealed an acute drop in her sodium to 116, previously 136.  TRH and nephrology were consulted and she was started on 3% normal saline.  On 9/17, she developed A-fib with RVR, cardiology was consulted, started on Cardizem and converted to NSR.  9/18, IV antibiotics initiated for left lower lobe atelectasis versus infiltrate.  9/19, went back into A-fib with RVR, started on amiodarone bolus and infusion, developed transient shock that resolved after fluid bolus, briefly required low-dose norepinephrine and steroids by PCCM.  She was started on heparin infusion for PE prophylaxis.  DVT negative on ultrasound.   Assessment & Plan:  Principal Problem:   Osteoarthritis of left knee Active Problems:   Acute metabolic encephalopathy   Acute hyponatremia   HCAP (healthcare-associated pneumonia)   AKI (acute kidney injury) ([place])   Hypotension   Paroxysmal atrial fibrillation with RVR (HCC)   PMR (polymyalgia rheumatica) (HCC)   DNR (do not resuscitate)   Hypotension - resolved Likely result of Amiodarone infusion d/t the timing.  Potential HCAP contributing. -BP on the mildly hypertensive side. -Wean off midodrine as tolerated. -Amiodarone p.o. restarted 9/19 and tolerating -Off of pressors.  Tolerating diet.  Discontinued IV fluids. -Started on Solumedrol in lieu of home 5 mg prednisone for hx of PMR, will taper down 9/21 to prednisone 20 mg daily and taper off to her home dose in a couple days.  Paroxysmal Atrial Fibrillation Hx HTN, MVR, HTN, HLD Initially given Cardizem but converted and later  started on amiodarone, subsequently developed hypotension.   -goal K+>4, Mg+>2  -appreciate Cardiology.  Continue oral amiodarone and IV heparin drip. -continue heparin infusion, consider transition to DOAC in 24-48h  -Telemetry shows sinus rhythm.  Occasional sinus tachycardia in the 100s.  PVCs.   HCAP vs Atelectasis Acute Hypoxic Respiratory Failure CXR c/f LLL infiltrate vs atelectasis.  Newly required O2 via [place], since off and on RA.  DVT r/o. CTA initially not feasible d/t AKI to r/o PE.  Started on heparin infusion for Afib. -continue cefepime and complete total 5 days course. -suspect hypoxia driven by atelectasis, post surgical.  Less likely PE -D3/x abx.  Encourage incentive spirometry.  Mobilize. -We will discuss with PCCM attending regarding CTA chest.  Per discussion with CCM PA today, low index of suspicion for PE.  Also if she is going to be on long-term anticoagulation for A-fib, may be can avoid CT with contrast given recent AKI.   AKI Hyponatremia Acute urinary retention Resolved, no longer on 3% NS as of 9/18 -appreciate Nephrology, signed off.  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  They felt AKI was related to bladder retention, bladder scan showed 900 mL and Foley catheter was placed. -Has great urine output.  Discontinued IV fluids. -Replace electrolytes as indicated -Avoid nephrotoxic agents, ensure adequate renal perfusion  Hypokalemia: Replace aggressively and follow.  Hypophosphatemia: Replace and follow.  Leukocytosis: Likely multifactorial related to infectious etiology, steroids and stress.  Improving.  Continue to trend daily CBCs.  Normocytic anemia: Stable compared to yesterday.   Acute Urinary Retention  -Not sure if still has urethral catheter, DC 9/21 and monitor for voiding -follow up  bladder scan as needed.   Acute Metabolic Encephalopathy Likely related to hypotension, hyponatremia > as BP improved so did mental status.   -supportive care -limit sedating medications  -delirium precautions  -Appears to have resolved.   Hx Seizures -continue home phenobarbital    Post op L TKA -post op mgmt per Ortho  -bowel regimen.  Patient requesting laxative/stool softeners. -reduce pain regimen > limited use, encephalopathy risk   Osteoarthritis/rheumatoid arthritis Patient on chronic prednisone 5 Mg daily.  Weaning off steroids to prior home dose.    Body mass index is 30.62 kg/m.    DVT prophylaxis: SCDs Start: 02/17/22 1101     Code Status: DNR:  Family Communication: Discussed in detail with patient's daughter at bedside, updated care and answered all questions.  She is a retired Therapist, sports. Disposition:  Status is: Inpatient Remains inpatient appropriate because: Transitioning from IV to p.o. meds, replacing significant electrolyte abnormalities, given her complex postop course, need to monitor closely.  Therapies evaluation.     Consultants:   PCCM Cardiology Nephrology  Procedures:   As noted above  Antimicrobials:   IV cefepime 9/19 > IV vancomycin x1 and metronidazole x1 month 9/19.   Subjective:  Somewhat hard of hearing.  Daughter at bedside.  Alert and oriented.  Eating small amounts multiple times yesterday.  Had a BM this morning, hard and asking for laxatives.  Denies dyspnea, chest pain, palpitations, dizziness or lightheadedness.  Not on home oxygen PTA.  All her providers are in [facility].  Reports that she had brief A-fib prior to previous knee surgery but not on anticoagulations PTA.  Objective:   Vitals:   02/25/22 0400 02/25/22 0500 02/25/22 0600 02/25/22 0800  BP: (!) 144/64 (!) 134/53 (!) 169/71 (!) 146/74  Pulse: 98 91 (!) 121 (!) 105  Resp: (!) 24 (!) 29 (!) 34 (!) 28  Temp: 98.1 F (36.7 C)   98.6 F (37 C)  TempSrc:  Oral   Oral  SpO2: 92% 92% 93% 96%  Weight:      Height:        General exam: Elderly female, moderately built and nourished lying comfortably propped up in bed without distress.  Oral mucosa moist. Respiratory system: Clear to auscultation. Respiratory effort normal. Cardiovascular system: S1 & S2 heard, RRR. No JVD, murmurs, rubs, gallops or clicks.  RLE without edema.  Trace left leg edema (postop).  Telemetry personally reviewed: Mostly sinus rhythm.  At times mild sinus tachycardia in the 100s.  PVCs. Gastrointestinal system: Abdomen is nondistended, soft and nontender. No organomegaly or masses felt. Normal bowel sounds heard. Central nervous system: Alert and oriented. No focal neurological deficits. Extremities: Symmetric 5 x 5 power.  Left knee postop splint.  Left knee/lower extremity movements limited due to splint and some pain. Skin: No rashes, lesions or ulcers Psychiatry: Judgement and insight appear normal. Mood & affect appropriate.     Data Reviewed:   I have personally reviewed following labs and imaging studies   CBC: Recent Labs  Lab 02/23/22 0929 02/24/22 0316 02/25/22 0329  WBC 28.2* 31.4* 22.3*  HGB 9.9* 9.3* 9.0*  HCT 31.3* 28.2* 27.6*  MCV 98.1 93.4 94.8  PLT 222 264 347    Basic Metabolic Panel: Recent Labs  Lab 02/21/22 1839 02/21/22 2042 02/22/22  1610 02/22/22 1113 02/22/22 1708 02/22/22 2342 02/23/22 0339 02/24/22 0316 02/25/22 0329  NA 124*   < > 131*   < > 130* 128* 131* 137 141  K 4.5  --  4.2  --   --   --  4.3 3.4* 2.8*  CL 95*  --  103  --   --   --  101 107 109  CO2 21*  --  19*  --   --   --  19* 21* 24  GLUCOSE 137*  --  127*  --   --   --  119* 145* 103*  BUN 14  --  25*  --   --   --  40* 33* 19  CREATININE 0.54  --  1.19*  --   --   --  1.91* 1.06* 0.62  CALCIUM 7.8*  --  8.2*  --   --   --  8.4* 8.2* 7.9*  MG  --   --   --   --   --   --  2.2 2.4 2.1  PHOS  --   --   --   --   --   --   --  3.0 1.5*   < > = values in  this interval not displayed.    Liver Function Tests: Recent Labs  Lab 02/23/22 0339 02/24/22 0316 02/25/22 0329  AST 26 22  --   ALT 22 24  --   ALKPHOS 73 81  --   BILITOT 1.6* 1.2  --   PROT 5.6* 5.4*  --   ALBUMIN 2.7* 2.6* 2.5*    CBG: Recent Labs  Lab 02/20/22 0746  GLUCAP 149*    Microbiology Studies:   Recent Results (from the past 240 hour(s))  Culture, blood (Routine X 2) w Reflex to ID Panel     Status: None (Preliminary result)   Collection Time: 02/20/22  7:55 PM   Specimen: BLOOD  Result Value Ref Range Status   Specimen Description   Final    BLOOD LEFT ANTECUBITAL Performed at [facility], [place] [address], [place], [place] [address]    Special Requests   Final    BOTTLES DRAWN AEROBIC AND ANAEROBIC Blood Culture adequate volume Performed at [place] [address] [place], [place] [address]    Culture   Final    NO GROWTH 3 DAYS Performed at [facility], [place] [address], [place], [place] [address]    Report Status PENDING  Incomplete  Culture, blood (Routine X 2) w Reflex to ID Panel     Status: None (Preliminary result)   Collection Time: 02/20/22  8:07 PM   Specimen: BLOOD  Result Value Ref Range Status   Specimen Description   Final    BLOOD BLOOD RIGHT HAND Performed at [place] [address] [place], [place] [address]    Special Requests   Final    BOTTLES DRAWN AEROBIC ONLY Blood Culture results may not be optimal due to an inadequate volume of blood received in culture bottles Performed at [place] [address] [place], [place] [address]    Culture   Final    NO GROWTH 3 DAYS Performed at [facility], [place] [address] [place] [address], [place],  [address]    Report Status PENDING  Incomplete  Culture, blood (Routine X 2) w Reflex to ID Panel     Status: None (Preliminary  result)   Collection Time: 2022-03-07  2:10 PM   Specimen: BLOOD   Result Value Ref Range Status   Specimen Description   Final    BLOOD SITE NOT SPECIFIED Performed at [place] [address], [place], [place] [address]    Special Requests   Final    BOTTLES DRAWN AEROBIC ONLY Blood Culture adequate volume Performed at [place] [address], [place], [place] [address]    Culture   Final    NO GROWTH < 24 HOURS Performed at [place] [address] [place], [place] [address]    Report Status PENDING  Incomplete  Culture, blood (Routine X 2) w Reflex to ID Panel     Status: None (Preliminary result)   Collection Time: 2022/03/07  2:10 PM   Specimen: BLOOD LEFT HAND  Result Value Ref Range Status   Specimen Description BLOOD LEFT HAND  Final   Special Requests   Final    BOTTLES DRAWN AEROBIC ONLY Blood Culture adequate volume   Culture   Final    NO GROWTH < 24 HOURS Performed at [facility], [place] [address] [place], [place] [address]    Report Status PENDING  Incomplete  Urine Culture     Status: None (Preliminary result)   Collection Time: 03-07-22  3:05 PM   Specimen: Urine, Catheterized  Result Value Ref Range Status   Specimen Description   Final    URINE, CATHETERIZED Performed at [place] [address] [place], [place] [address]    Special Requests   Final    NONE Performed at [facility], [place] [address], [place], [place] [address]    Culture   Final    NO GROWTH Performed at [facility], [place] [address], [place], [place] [address]    Report Status PENDING  Incomplete  MRSA Next Gen by PCR, Nasal     Status: None   Collection Time: 03-07-22  5:49 PM   Specimen: Nasal Mucosa; Nasal Swab  Result Value Ref Range Status   MRSA by PCR Next Gen NOT DETECTED NOT DETECTED Final    Comment: (NOTE) The GeneXpert MRSA Assay (FDA approved for NASAL specimens only), is one component of a comprehensive MRSA colonization  surveillance program. It is not intended to diagnose MRSA infection nor to guide or monitor treatment for MRSA infections. Test performance is not FDA approved in patients less than 79 years old. Performed at [facility], [place] [address], [place], [place] [address]     Radiology Studies:  US RENAL  Result Date: 03/07/22 CLINICAL DATA:  Acute renal insufficiency EXAM: RENAL / URINARY TRACT ULTRASOUND COMPLETE COMPARISON:  12/24/2021 FINDINGS: Right Kidney: Renal measurements: 11.2 x 4.1 x 4.4 cm = volume: 104.8 mL. There are 2 renal cortical cysts, measuring 1.0 and 1.2 cm respectively. Extrarenal pelvis again noted. No hydronephrosis or renal mass. Normal renal cortical echotexture. Left Kidney: Renal measurements: 11.0 x 5.0 x 5.1 cm = volume: 146.4 mL. Echogenicity within normal limits. No mass or hydronephrosis visualized. Bladder: Bladder is decompressed with a Foley catheter. Other: None. IMPRESSION: 1. Grossly unremarkable appearance of the bilateral kidneys. Stable extrarenal pelvis right kidney without hydronephrosis. Electronically Signed   By: Randa Ngo M.D.   On: 03-07-22 20:09   VAS [place] LOWER EXTREMITY VENOUS (DVT)  Result Date: 2022-03-07  Lower Venous DVT Study Patient Name:  Tekeisha L Gorton  Date of Exam:   02/23/2022 Medical Rec #: [id number]          Accession #:    [id number] Date of Birth: [birth year]          Patient Gender: F Patient Age:   62 years Exam Location:  [facility] Procedure:      VAS [place] LOWER EXTREMITY VENOUS (DVT) Referring Phys: Montey Hora --------------------------------------------------------------------------------  Indications: Hypoxia.  Risk Factors: Surgery. Limitations: Body habitus, poor ultrasound/tissue interface and patient positioning, patient immobility. Comparison Study: No prior studies. Performing Technologist: Oliver Hum RVT  Examination Guidelines: A complete evaluation includes B-mode imaging, spectral  Doppler, color Doppler, and power Doppler as needed of all accessible portions of each vessel. Bilateral testing is considered an integral part of a complete examination. Limited examinations for reoccurring indications may be performed as noted. The reflux portion of the exam is performed with the patient in reverse Trendelenburg.  +---------+---------------+---------+-----------+----------+--------------+ RIGHT    CompressibilityPhasicitySpontaneityPropertiesThrombus Aging +---------+---------------+---------+-----------+----------+--------------+ CFV      Full           Yes      No                                  +---------+---------------+---------+-----------+----------+--------------+ SFJ      Full                                                        +---------+---------------+---------+-----------+----------+--------------+ FV Prox  Full                                                        +---------+---------------+---------+-----------+----------+--------------+ FV Mid   Full                                                        +---------+---------------+---------+-----------+----------+--------------+ FV DistalFull           Yes      No                                  +---------+---------------+---------+-----------+----------+--------------+ PFV      Full                                                        +---------+---------------+---------+-----------+----------+--------------+ POP      Full           Yes      Yes                                 +---------+---------------+---------+-----------+----------+--------------+ PTV      Full                                                        +---------+---------------+---------+-----------+----------+--------------+  PERO     Full                                                        +---------+---------------+---------+-----------+----------+--------------+    +---------+---------------+---------+-----------+----------+-------------------+ LEFT     CompressibilityPhasicitySpontaneityPropertiesThrombus Aging      +---------+---------------+---------+-----------+----------+-------------------+ CFV      Full           Yes      No                                       +---------+---------------+---------+-----------+----------+-------------------+ SFJ      Full                                                             +---------+---------------+---------+-----------+----------+-------------------+ FV Prox  Full                                                             +---------+---------------+---------+-----------+----------+-------------------+ FV Mid   Full                                                             +---------+---------------+---------+-----------+----------+-------------------+ FV Distal               Yes      Yes                                      +---------+---------------+---------+-----------+----------+-------------------+ PFV      Full                                                             +---------+---------------+---------+-----------+----------+-------------------+ POP      Full           Yes      Yes                                      +---------+---------------+---------+-----------+----------+-------------------+ PTV      Full                                                             +---------+---------------+---------+-----------+----------+-------------------+  PERO                                                  Not well visualized +---------+---------------+---------+-----------+----------+-------------------+     Summary: RIGHT: - There is no evidence of deep vein thrombosis in the lower extremity. However, portions of this examination were limited- see technologist comments above.  - No cystic structure found in the popliteal fossa.  LEFT: - There is  no evidence of deep vein thrombosis in the lower extremity. However, portions of this examination were limited- see technologist comments above.  - No cystic structure found in the popliteal fossa.  *See table(s) above for measurements and observations. Electronically signed by Deitra Mayo MD on 02/23/2022 at 5:23:37 PM.    Final     Scheduled Meds:    amiodarone  400 mg Oral BID   Chlorhexidine Gluconate Cloth  6 each Topical Daily   ezetimibe  10 mg Oral Daily   feeding supplement  237 mL Oral BID BM   furosemide  40 mg Oral Daily   methylPREDNISolone (SOLU-MEDROL) injection  40 mg Intravenous Daily   midodrine  10 mg Oral TID WC   pantoprazole  40 mg Oral Daily   PHENobarbital  97.2 mg Oral QHS   potassium chloride  40 mEq Oral Q4H    Continuous Infusions:    sodium chloride 50 mL/hr at 02/25/22 0600   sodium chloride     ceFEPime (MAXIPIME) IV Stopped (02/24/22 2205)   heparin 1,050 Units/hr (02/25/22 0600)   methocarbamol (ROBAXIN) IV     potassium PHOSPHATE IVPB (in mmol) 64.4 mL/hr at 02/25/22 0600     LOS: 4 days     Vernell Leep, MD,  Vernell Leep, MD,  FACP, [facility], [facility], [facility], [facility]  [facility] [place]   To contact the attending provider between 7A-7P or the covering provider during after hours 7P-7A, please log into the web site [email or web address] and access using universal [place] password for that web site. If you do not have the password, please call the hospital operator.  02/25/2022, 9:10 AM

## 2022-02-25 NOTE — TOC Initial Note (Addendum)
Transition of Care [facility]) - Initial/Assessment Note    Patient Details  Name: [name] MRN: [id number] Date of Birth: 03-Apr-1930  [facility] CM/SW Contact:    Vassie Moselle, LCSW Phone Number: 02/25/2022, 2:29 PM  Clinical Narrative:                 CSW met with pt's daughters at bedside and introduced TOC. Pt was sleeping during conversation. Pt's daughters are agreeable to pt going to SNF at discharge. They share that their mother has not been to SNF in the past however, their father had been to [facility] and [facility]. They would like a facility in the [place] area however, are open to her being referred outside of the area as well.   Pt has been referred out for placement and currently awaiting bed offers.   Expected Discharge Plan: Skilled Nursing Facility Barriers to Discharge: Continued Medical Work up   Patient Goals and CMS Choice Patient states their goals for this hospitalization and ongoing recovery are:: To return home   Choice offered to / list presented to : Adult Children, Patient  Expected Discharge Plan and Services Expected Discharge Plan: [place] In-house Referral: NA Discharge Planning Services: CM Consult Post Acute Care Choice: [place] Living arrangements for the past 2 months: Single Family Home Expected Discharge Date: 02/21/22               DME Arranged: N/A DME Agency: NA                  Prior Living Arrangements/Services Living arrangements for the past 2 months: Single Family Home Lives with:: Self Patient language and need for interpreter reviewed:: Yes Do you feel safe going back to the place where you live?: Yes      Need for Family Participation in Patient Care: No (Comment) Care giver support system in place?: No (comment) Current home services: DME Criminal Activity/Legal Involvement Pertinent to Current Situation/Hospitalization: No -  Comment as needed  Activities of Daily Living Home Assistive Devices/Equipment: Walker (specify type), Cane (specify quad or straight), Grab bars in shower, Built-in shower seat (rolling walker, 3-prong cane) ADL Screening (condition at time of admission) Patient's cognitive ability adequate to safely complete daily activities?: Yes Is the patient deaf or have difficulty hearing?: No Does the patient have difficulty seeing, even when wearing glasses/contacts?: Yes Does the patient have difficulty concentrating, remembering, or making decisions?: No Patient able to express need for assistance with ADLs?: Yes Does the patient have difficulty dressing or bathing?: No Independently performs ADLs?: Yes (appropriate for developmental age) Does the patient have difficulty walking or climbing stairs?: Yes Weakness of Legs: None Weakness of Arms/Hands: None  Permission Sought/Granted Permission sought to share information with : Facility Sport and exercise psychologist, Family Supports Permission granted to share information with : Yes, Verbal Permission Granted  Share Information with NAME: Myriam Forehand     Permission granted to share info w Relationship: Daughter  Permission granted to share info w Contact Information: [phone]  Emotional Assessment Appearance:: Appears stated age Attitude/Demeanor/Rapport: Unable to Assess Affect (typically observed): Unable to Assess Orientation: : Oriented to Self, Oriented to Place, Oriented to  Time, Oriented to Situation Alcohol / Substance Use: Not Applicable Psych Involvement: No (comment)  Admission diagnosis:  Osteoarthritis of left knee [M17.12] Patient Active Problem List   Diagnosis Date Noted   HCAP (healthcare-associated pneumonia) 02/23/2022   AKI (acute kidney injury) ([place]) 02/23/2022  Hypotension 02/23/2022   Paroxysmal atrial fibrillation with RVR ([place]) 02/23/2022   PMR (polymyalgia rheumatica) ([place]) 02/23/2022   DNR (do not  resuscitate) 12/78/7183   Acute metabolic encephalopathy 67/25/5001   Acute hyponatremia 02/20/2022   Osteoarthritis of left knee 02/17/2022   Osteoarthritis of right knee 01/04/2020   PCP:  [name], MD Pharmacy:   [facility] [phone] - [place], [place] - [address] AT [place] [address] [place] [address] Phone: [phone] Fax: [phone]     Social Determinants of Health ([place]) Interventions    Readmission Risk Interventions    02/25/2022    2:27 PM  Readmission Risk Prevention Plan  Transportation Screening Complete  PCP or Specialist Appt within 5-7 Days Complete  Home Care Screening Complete  Medication Review (RN CM) Complete

## 2022-02-25 NOTE — Progress Notes (Signed)
ANTICOAGULATION CONSULT NOTE  Pharmacy Consult for Heparin + Vanco/Cefepime Indication:  Afib and r/o PE + Sepsis/PNA  Allergies  Allergen Reactions   Sulfa Antibiotics Nausea Only   Penicillins Swelling    Tolerated Cephalosporin 01/04/20      Simvastatin Swelling    Patient Measurements: Height: '5\' 1"'$  (154.9 cm) Weight: 73.5 kg (162 lb 0.6 oz) IBW/kg (Calculated) : 47.8 Heparin Dosing Weight: 65 kg  Vital Signs: Temp: 98.1 F (36.7 C) (09/21 0400) Temp Source: Oral (09/21 0400) BP: 163/56 (09/20 2100) Pulse Rate: 103 (09/20 2100)  Labs: Recent Labs    02/23/22 0339 02/23/22 0929 02/23/22 0929 02/23/22 1930 02/24/22 0316 02/25/22 0329  HGB  --  9.9*   < >  --  9.3* 9.0*  HCT  --  31.3*  --   --  28.2* 27.6*  PLT  --  222  --   --  264 278  HEPARINUNFRC  --   --   --  0.51 0.67 0.37  CREATININE 1.91*  --   --   --  1.06* 0.62   < > = values in this interval not displayed.     Estimated Creatinine Clearance: 42.9 mL/min (by C-G formula based on SCr of 0.62 mg/dL).  Assessment: Active Problem(s): 9/13: s/p TKR   PMH: hypertension, GERD, arthritis, polymyalgia rheumatica, seizures   Assessment 4 yoF with PMH PAF not on anticoag, who experienced AFib/RVR after TKA. Also with hypoxia likely d/t PNA, but CCM would like to rule out PE as well. Pharmacy consulted to dose IV heparin.  Today, 02/25/2022: Hgb low but stable; Plt stable WNL Heparin level 0.37 therapeutic on 1050 units/hr SCr improved to WNL No bleeding or infusion issues noted  Goal of Therapy:  Heparin level 0.3-0.7 units/ml Monitor platelets by anticoagulation protocol: Yes   Plan:  Continue heparin at 1050 units/hr Daily HL and CBC F/u plans for long-term anticoagulation   Dolly Rias RPh 02/25/2022, 5:10 AM

## 2022-02-25 NOTE — Progress Notes (Signed)
Subjective: 8 Days Post-Op Procedure(s) (LRB): TOTAL KNEE ARTHROPLASTY (Left) Seen in rounds for Dr. [name] Patient reports pain as mild.  States she has been having some shooting pains in leg, but overall doing well Denies any calf pain  Objective: Vital signs in last 24 hours: Temp:  [98.1 F (36.7 C)-98.7 F (37.1 C)] 98.6 F (37 C) (09/21 0800) Pulse Rate:  [91-121] 120 (09/21 1000) Resp:  [13-34] 28 (09/21 1000) BP: (126-173)/(52-109) 149/60 (09/21 1000) SpO2:  [89 %-98 %] 92 % (09/21 1000)  Intake/Output from previous day: 09/20 0701 - 09/21 0700 In: 2580.2 [P.O.:840; I.V.:1551.3; IV Piggyback:188.9] Out: 5330 [Urine:5330] Intake/Output this shift: No intake/output data recorded.  Recent Labs    02/23/22 0929 02/24/22 0316 02/25/22 0329  HGB 9.9* 9.3* 9.0*   Recent Labs    02/24/22 0316 02/25/22 0329  WBC 31.4* 22.3*  RBC 3.02* 2.91*  HCT 28.2* 27.6*  PLT 264 278   Recent Labs    02/24/22 0316 02/25/22 0329  NA 137 141  K 3.4* 2.8*  CL 107 109  CO2 21* 24  BUN 33* 19  CREATININE 1.06* 0.62  GLUCOSE 145* 103*  CALCIUM 8.2* 7.9*   No results for input(s): "LABPT", "INR" in the last 72 hours.  Neurologically intact Neurovascular intact Sensation intact distally Intact pulses distally Dorsiflexion/Plantar flexion intact Incision: scant drainage Compartment soft   Assessment/Plan: 8 Days Post-Op Procedure(s) (LRB): TOTAL KNEE ARTHROPLASTY (Left) Advance diet I encouraged continued ankle pumps Remain in knee immobilizer at this time, okay to put weight on leg as tolerated Minimal ROM of Knee Looks like d/c recommendation is a SNF, sounds like a good idea from ortho standpoint   Drue Novel, PA-C Dr. [name], Rosanne Gutting [phone] 02/25/2022, 10:25 AM

## 2022-02-25 NOTE — NC FL2 (Signed)
[place] LEVEL OF CARE SCREENING TOOL     IDENTIFICATION  Patient Name: [name] Birthdate: 05/01/1930 Sex: female Admission Date (Current Location): 02/17/2022  [facility] and [place] Number:  Herbalist and Address:  [facility],  [address] [place], [place]      Provider Number: [id number]  Attending Physician Name and Address:  Sydnee Cabal, MD  Relative Name and Phone Number:  Daughter, Myriam Forehand [phone]    Current Level of Care: Hospital Recommended Level of Care: [place] Prior Approval Number:    Date Approved/Denied:   PASRR Number: [id number]  Discharge Plan:      Current Diagnoses: Patient Active Problem List   Diagnosis Date Noted   HCAP (healthcare-associated pneumonia) 02/23/2022   AKI (acute kidney injury) ([place]) 02/23/2022   Hypotension 02/23/2022   Paroxysmal atrial fibrillation with RVR ([place]) 02/23/2022   PMR (polymyalgia rheumatica) ([place]) 02/23/2022   DNR (do not resuscitate) 43/32/9518   Acute metabolic encephalopathy 84/16/6063   Acute hyponatremia 02/20/2022   Osteoarthritis of left knee 02/17/2022   Osteoarthritis of right knee 01/04/2020    Orientation RESPIRATION BLADDER Height & Weight     Self, Time, Situation, Place  Normal Continent Weight: 162 lb 0.6 oz (73.5 kg) Height:  '5\' 1"'$  (154.9 cm)  BEHAVIORAL SYMPTOMS/MOOD NEUROLOGICAL BOWEL NUTRITION STATUS      Continent Diet (Regular)  AMBULATORY STATUS COMMUNICATION OF NEEDS Skin   Extensive Assist Verbally Normal                       Personal Care Assistance Level of Assistance  Bathing, Feeding, Dressing Bathing Assistance: Maximum assistance Feeding assistance: Limited assistance Dressing Assistance: Maximum assistance     Functional Limitations Info  Sight, Hearing, Speech Sight Info: Impaired Hearing Info: Adequate Speech Info: Adequate    SPECIAL CARE FACTORS FREQUENCY  PT (By  licensed PT), OT (By licensed OT)     PT Frequency: 5x/wk OT Frequency: 5x/wk            Contractures Contractures Info: Not present    Additional Factors Info  Code Status, Allergies Code Status Info: DNR Allergies Info: Sulfa Antibiotics, Penicillins, Simvastatin           Current Medications (02/25/2022):  This is the current hospital active medication list Current Facility-Administered Medications  Medication Dose Route Frequency Provider Last Rate Last Admin   0.9 %  sodium chloride infusion  250 mL Intravenous Continuous Caren Griffins, MD       acetaminophen (TYLENOL) tablet 325-650 mg  325-650 mg Oral Q6H PRN Drue Novel, PA   650 mg at 02/24/22 2130   alum & mag hydroxide-simeth (MAALOX/MYLANTA) 200-200-20 MG/5ML suspension 30 mL  30 mL Oral Q6H PRN Cline Crock, PA-C   30 mL at 02/20/22 1113   amiodarone (PACERONE) tablet 400 mg  400 mg Oral BID Janina Mayo, MD   400 mg at 02/25/22 0918   bisacodyl (DULCOLAX) EC tablet 5 mg  5 mg Oral Daily PRN Elizabeth Sauer R, PA   5 mg at 02/22/22 1047   ceFEPIme (MAXIPIME) 2 g in sodium chloride 0.9 % 100 mL IVPB  2 g Intravenous Q12H Suzzanne Cloud, RPH   Stopped at 02/25/22 1044   Chlorhexidine Gluconate Cloth 2 % PADS 6 each  6 each Topical Daily Sydnee Cabal, MD   6 each at 02/25/22 0949   diphenhydrAMINE (BENADRYL) 12.5 MG/5ML elixir 12.5-25  mg  12.5-25 mg Oral Q4H PRN Elizabeth Sauer R, PA       ezetimibe (ZETIA) tablet 10 mg  10 mg Oral Daily Elizabeth Sauer R, PA   10 mg at 02/25/22 0924   feeding supplement (ENSURE ENLIVE / ENSURE PLUS) liquid 237 mL  237 mL Oral BID BM Cline Crock, PA-C   237 mL at 02/24/22 6378   furosemide (LASIX) tablet 40 mg  40 mg Oral Daily Janina Mayo, MD   40 mg at 02/25/22 0918   heparin ADULT infusion 100 units/mL (25000 units/249m)  1,050 Units/hr Intravenous Continuous RKarren Cobble RPH 10.5 mL/hr at 02/25/22 1414 1,050 Units/hr at 02/25/22 1414    HYDROmorphone (DILAUDID) injection 0.5-1 mg  0.5-1 mg Intravenous Q4H PRN HElizabeth SauerR, PA   1 mg at 02/23/22 0350   magnesium citrate solution 1 Bottle  1 Bottle Oral Once PRN HElizabeth SauerR, PA       methocarbamol (ROBAXIN) tablet 500 mg  500 mg Oral Q6H PRN Haus, Leeanne R, PA   500 mg at 02/18/22 1450   Or   methocarbamol (ROBAXIN) 500 mg in dextrose 5 % 50 mL IVPB  500 mg Intravenous Q6H PRN Haus, Leeanne R, PA       metoprolol tartrate (LOPRESSOR) tablet 12.5 mg  12.5 mg Oral BID JMargie Billet PA-C   12.5 mg at 02/25/22 1212   midodrine (PROAMATINE) tablet 5 mg  5 mg Oral TID WC Hongalgi, Anand D, MD   5 mg at 02/25/22 1212   ondansetron (ZOFRAN) tablet 4 mg  4 mg Oral Q6H PRN Haus, Leeanne R, PA       Or   ondansetron (ZOFRAN) injection 4 mg  4 mg Intravenous Q6H PRN Haus, Leeanne R, PA   4 mg at 02/20/22 1142   Oral care mouth rinse  15 mL Mouth Rinse PRN CSydnee Cabal MD   15 mL at 02/24/22 0942   oxyCODONE (Oxy IR/ROXICODONE) immediate release tablet 5 mg  5 mg Oral Q4H PRN Ollis, Brandi L, NP       pantoprazole (PROTONIX) EC tablet 40 mg  40 mg Oral Daily Haus, Leeanne R, PA   40 mg at 02/25/22 0918   PHENobarbital (LUMINAL) tablet 97.2 mg  97.2 mg Oral QHS Haus, Leeanne R, PA   97.2 mg at 02/24/22 2133   polyethylene glycol (MIRALAX / GLYCOLAX) packet 17 g  17 g Oral Daily HVernell LeepD, MD   17 g at 02/25/22 0951   predniSONE (DELTASONE) tablet 20 mg  20 mg Oral Q breakfast HVernell LeepD, MD   20 mg at 02/25/22 0946   senna-docusate (Senokot-S) tablet 1 tablet  1 tablet Oral QHS PRN HElizabeth SauerR, PA   1 tablet at 02/21/22 1025   senna-docusate (Senokot-S) tablet 1 tablet  1 tablet Oral BID HModena Jansky MD   1 tablet at 02/25/22 05885    Discharge Medications: Please see discharge summary for a list of discharge medications.  Relevant Imaging Results:  Relevant Lab Results:   Additional Information SSN: [id number] MVassie Moselle  LCSW

## 2022-02-25 NOTE — Progress Notes (Signed)
PT Cancellation Note  Patient Details Name: [name] MRN: [id number] DOB: 03/21/1930   Cancelled Treatment:     pt OOB with OT currently in recliner with a resting HR in 130's.  Will attempt to see later today or next.  Pt now plans to D/C to SNF vs home.   Rica Koyanagi  PTA Acute  Rehabilitation Services Office M-F          [phone] Weekend pager [phone]

## 2022-02-26 DIAGNOSIS — N179 Acute kidney failure, unspecified: Secondary | ICD-10-CM | POA: Diagnosis not present

## 2022-02-26 DIAGNOSIS — E876 Hypokalemia: Secondary | ICD-10-CM | POA: Diagnosis not present

## 2022-02-26 LAB — CULTURE, BLOOD (ROUTINE X 2)
Culture: NO GROWTH
Culture: NO GROWTH
Special Requests: ADEQUATE

## 2022-02-26 LAB — RENAL FUNCTION PANEL
Albumin: 2.6 g/dL — ABNORMAL LOW (ref 3.5–5.0)
Anion gap: 8 (ref 5–15)
BUN: 11 mg/dL (ref 8–23)
CO2: 26 mmol/L (ref 22–32)
Calcium: 7.8 mg/dL — ABNORMAL LOW (ref 8.9–10.3)
Chloride: 103 mmol/L (ref 98–111)
Creatinine, Ser: 0.57 mg/dL (ref 0.44–1.00)
GFR, Estimated: >60 mL/min (ref >60)
Glucose, Bld: 103 mg/dL — ABNORMAL HIGH (ref 70–99)
Phosphorus: 2.4 mg/dL — ABNORMAL LOW (ref 2.5–4.6)
Potassium: 3.3 mmol/L — ABNORMAL LOW (ref 3.5–5.1)
Sodium: 137 mmol/L (ref 135–145)

## 2022-02-26 LAB — CBC
HCT: 28.3 % — ABNORMAL LOW (ref 36.0–46.0)
Hemoglobin: 9.3 g/dL — ABNORMAL LOW (ref 12.0–15.0)
MCH: 31 pg (ref 26.0–34.0)
MCHC: 32.9 g/dL (ref 30.0–36.0)
MCV: 94.3 fL (ref 80.0–100.0)
Platelets: 281 10*3/uL (ref 150–400)
RBC: 3 MIL/uL — ABNORMAL LOW (ref 3.87–5.11)
RDW: 15.7 % — ABNORMAL HIGH (ref 11.5–15.5)
WBC: 23 10*3/uL — ABNORMAL HIGH (ref 4.0–10.5)
nRBC: 0.2 % (ref 0.0–0.2)

## 2022-02-26 LAB — HEPARIN LEVEL (UNFRACTIONATED): Heparin Unfractionated: 0.46 IU/mL (ref 0.30–0.70)

## 2022-02-26 MED ORDER — SIMETHICONE 80 MG PO CHEW
80.0000 mg | CHEWABLE_TABLET | Freq: Four times a day (QID) | ORAL | Status: DC | PRN
  Administered 2022-02-26: 80 mg via ORAL
  Filled 2022-02-26: qty 1

## 2022-02-26 MED ORDER — POTASSIUM PHOSPHATES 15 MMOLE/5ML IV SOLN
30.0000 mmol | Freq: Once | INTRAVENOUS | Status: AC
  Administered 2022-02-26: 30 mmol via INTRAVENOUS
  Filled 2022-02-26: qty 10

## 2022-02-26 MED ORDER — POTASSIUM CHLORIDE CRYS ER 20 MEQ PO TBCR
30.0000 meq | EXTENDED_RELEASE_TABLET | ORAL | Status: AC
  Administered 2022-02-26 (×2): 30 meq via ORAL
  Filled 2022-02-26 (×2): qty 1

## 2022-02-26 NOTE — TOC Progression Note (Addendum)
Transition of Care [facility]) - Progression Note    Patient Details  Name: [name] MRN: [id number] Date of Birth: [birth year]  [facility]) CM/SW [place], LCSW Phone Number: 02/26/2022, 11:14 AM  Clinical Narrative:    CSW met with pt and daughter to review bed offers for SNF. Pt requested referral be sent to [facility] in [place] as she had spoken with a friend who was recently there and had a good experience. [facility] is pt's top choice and if they do not have bed availability she will review other bed offers again.  CSW faxed referral to [facility] for review and currently awaiting acceptance/denial. Per admissions if pt is accepted to their facility they would not be able to accept until Monday 03/01/22.   Update 12:50- CSW spoke with Simona Huh in the admission department at [facility] who shared that pt's referral was under review w/ the review committee. He agrees to contact CSW once decision has been made.    Expected Discharge Plan: [place] Barriers to Discharge: Continued Medical Work up  Expected Discharge Plan and Services Expected Discharge Plan: [place] In-house Referral: NA Discharge Planning Services: CM Consult Post Acute Care Choice: [place] Living arrangements for the past 2 months: Single Family Home Expected Discharge Date: 02/21/22               DME Arranged: N/A DME Agency: NA                   Social Determinants of Health (SDOH) Interventions    Readmission Risk Interventions    02/25/2022    2:27 PM  Readmission Risk Prevention Plan  Transportation Screening Complete  PCP or Specialist Appt within 5-7 Days Complete  Home Care Screening Complete  Medication Review (RN CM) Complete

## 2022-02-26 NOTE — Progress Notes (Signed)
ANTICOAGULATION CONSULT NOTE  Pharmacy Consult for Heparin + Vanco/Cefepime Indication:  Afib and r/o PE + Sepsis/PNA  Allergies  Allergen Reactions   Sulfa Antibiotics Nausea Only   Penicillins Swelling    Tolerated Cephalosporin 01/04/20      Simvastatin Swelling    Patient Measurements: Height: '5\' 1"'$  (154.9 cm) Weight: 73.5 kg (162 lb 0.6 oz) IBW/kg (Calculated) : 47.8 Heparin Dosing Weight: 65 kg  Vital Signs: Temp: 98.4 F (36.9 C) (09/22 0000) Temp Source: Oral (09/22 0000) BP: 151/68 (09/22 0300) Pulse Rate: 87 (09/22 0300)  Labs: Recent Labs    02/24/22 0316 02/25/22 0329 02/26/22 0247  HGB 9.3* 9.0* 9.3*  HCT 28.2* 27.6* 28.3*  PLT 264 278 281  HEPARINUNFRC 0.67 0.37 0.46  CREATININE 1.06* 0.62 0.57     Estimated Creatinine Clearance: 42.9 mL/min (by C-G formula based on SCr of 0.57 mg/dL).  Assessment: Active Problem(s): 9/13: s/p TKR   PMH: hypertension, GERD, arthritis, polymyalgia rheumatica, seizures   Assessment 32 yoF with PMH PAF not on anticoag, who experienced AFib/RVR after TKA. Also with hypoxia likely d/t PNA, but CCM would like to rule out PE as well. Pharmacy consulted to dose IV heparin.  Today, 02/26/2022: Hgb low but stable; Plt stable WNL Heparin level 0.46 therapeutic on 1050 units/hr SCr improved to WNL No bleeding or infusion issues noted  Goal of Therapy:  Heparin level 0.3-0.7 units/ml Monitor platelets by anticoagulation protocol: Yes   Plan:  Continue heparin at 1050 units/hr Daily HL and CBC F/u plans for long-term anticoagulation   Dolly Rias RPh 02/26/2022, 3:44 AM

## 2022-02-26 NOTE — Progress Notes (Signed)
PROGRESS NOTE   [name]  ATF:[id number]    DOB: 07/17/1929    DOA: 02/17/2022  PCP: [name], MD   I have briefly reviewed patients previous medical records in [facility].   Brief Narrative:  PCCM >TRH for continued medical consult 02/25/2022: 88 year old female underwent left TKA by Dr. [name] on 02/17/2022.  9/16, noted to have AMS with hypersomnolence and labs revealed an acute drop in her sodium to 116, previously 136.  TRH and nephrology were consulted and she was started on 3% normal saline.  On 9/17, she developed A-fib with RVR, cardiology was consulted, started on Cardizem and converted to NSR.  9/18, IV antibiotics initiated for left lower lobe atelectasis versus infiltrate.  9/19, went back into A-fib with RVR, started on amiodarone bolus and infusion, developed transient shock that resolved after fluid bolus, briefly required low-dose norepinephrine and steroids by PCCM.  She was started on heparin infusion for PE prophylaxis.  DVT negative on ultrasound.   Assessment & Plan:  Principal Problem:   Osteoarthritis of left knee Active Problems:   Acute metabolic encephalopathy   Acute hyponatremia   HCAP (healthcare-associated pneumonia)   AKI (acute kidney injury) ([place])   Hypotension   Paroxysmal atrial fibrillation with RVR (HCC)   PMR (polymyalgia rheumatica) (HCC)   DNR (do not resuscitate)   Hypotension - resolved Likely result of Amiodarone infusion d/t the timing.  Potential HCAP contributing. -BP on the mildly hypertensive side. -Discontinued midodrine 9/22. -Amiodarone p.o. restarted 9/19 and tolerating -Off of pressors.  Tolerating diet.  Discontinued IV fluids. -Started on Solumedrol in lieu of home 5 mg prednisone for hx of PMR, will taper down 9/21 to prednisone 20 mg daily and taper off to her home dose in a couple days.  Paroxysmal Atrial Fibrillation Hx HTN, MVR, HTN, HLD Initially given Cardizem but converted and later  started on amiodarone, subsequently developed hypotension.   -goal K+>4, Mg+>2  -appreciate Cardiology.  Continue oral amiodarone and IV heparin drip. -continue heparin infusion, consider transition to DOAC in 24-48h  -Telemetry shows sinus rhythm.  Occasional sinus tachycardia in the 100s.  PVCs.  Telemetry unchanged. -Checking with Dr. [name] if okay to start Eliquis, patient and daughter at bedside agreeable.   HCAP vs Atelectasis Acute Hypoxic Respiratory Failure CXR c/f LLL infiltrate vs atelectasis.  Newly required O2 via [place], since off and on RA.  DVT r/o. CTA initially not feasible d/t AKI to r/o PE.  Started on heparin infusion for Afib. -continue cefepime and complete total 5 days course. -suspect hypoxia driven by atelectasis, post surgical.  Less likely PE -D4/x abx.  Encourage incentive spirometry.  Mobilize. -Communicated with Dr. Verlee Monte, PCCM on 9/21 and if she is going to be on anticoagulation anyway for A-fib then agrees that we can defer CTA chest.  Also low index of suspicion for PE.     AKI Hyponatremia Acute urinary retention Resolved, no longer on 3% NS as of 9/18 -appreciate Nephrology, signed off.  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  They felt AKI was related to bladder retention, bladder scan showed 900 mL and Foley catheter was placed. -Has great urine output.  Discontinued IV fluids. -Replace electrolytes as indicated -Avoid nephrotoxic agents, ensure adequate renal perfusion -Foley catheter discontinued 9/21 and patient voiding without difficulty.  Hypokalemia: Continue to replace aggressively and  follow.  Hypophosphatemia: Continue to replace and follow.  Leukocytosis: Likely multifactorial related to infectious etiology, steroids and stress.  Improving.  Continue to trend daily  CBCs.  Normocytic anemia: Stable   Acute Urinary Retention  -Resolved.  Discontinued Foley catheter and voiding without difficulty.   Acute Metabolic Encephalopathy Likely related to hypotension, hyponatremia > as BP improved so did mental status.   -supportive care -limit sedating medications  -delirium precautions  -Appears to have resolved.   Hx Seizures -continue home phenobarbital    Post op L TKA -post op mgmt per Ortho  -bowel regimen.  Patient requesting laxative/stool softeners. -reduce pain regimen > limited use, encephalopathy risk  -Appears almost ready for DC probably 9/23 on 9/24  Osteoarthritis/rheumatoid arthritis Patient on chronic prednisone 5 Mg daily.  Weaning off steroids to prior home dose.    Body mass index is 30.03 kg/m.    DVT prophylaxis: SCDs Start: 02/17/22 1101     Code Status: DNR:  Family Communication: Discussed in detail with patient's daughter at bedside today, updated care and answered all questions.  She is a retired Therapist, sports. Disposition:  Status is: Inpatient Remains inpatient appropriate because: Transitioning from IV to p.o. meds, replacing significant electrolyte abnormalities, given her complex postop course, need to monitor closely.  Therapies evaluation.     Consultants:   PCCM Cardiology Nephrology  Procedures:   As noted above  Antimicrobials:   IV cefepime 9/19 > IV vancomycin x1 and metronidazole x1 month 9/19.   Subjective:  Multiple BMs yesterday, 5-6, eventually loose or watery and hence laxatives held per daughter's request.  Some productive cough but no dyspnea.  No other complaints reported. Objective:   Vitals:   02/26/22 0400 02/26/22 0500 02/26/22 0600 02/26/22 0700  BP: 134/61 (!) 150/54 (!) 146/65   Pulse: 88 89 95   Resp: (!) 21 (!) 26 (!) 23   Temp: 97.8 F (36.6 C)   97.6 F (36.4 C)  TempSrc: Oral   Oral  SpO2: 93% 95% 95%   Weight: 72.1 kg     Height:        General exam: Elderly  female, moderately built and nourished lying comfortably propped up in bed without distress.  Oral mucosa moist. Respiratory system: Occasional basal crackles but otherwise clear to auscultation. Cardiovascular system: S1 & S2 heard, RRR. No JVD, murmurs, rubs, gallops or clicks.  RLE without edema.  Trace left leg edema (postop).  Telemetry personally reviewed: Predominantly in sinus rhythm.  Had some mild sinus tachycardia yesterday. Gastrointestinal system: Abdomen is nondistended, soft and nontender. No organomegaly or masses felt. Normal bowel sounds heard. Central nervous system: Alert and oriented. No focal neurological deficits. Extremities: Symmetric 5 x 5 power.  Left knee postop splint has been removed.  Surgical site without acute findings.  Left knee and lower leg with extensive bruising from surgery.  Left forearm bruising without overt bleeding. Skin: No rashes, lesions or ulcers Psychiatry: Judgement and insight appear normal. Mood & affect appropriate.     Data Reviewed:   I have personally reviewed following labs and imaging studies   CBC: Recent Labs  Lab 02/24/22 0316 02/25/22 0329 02/26/22 0247  WBC 31.4* 22.3* 23.0*  HGB 9.3* 9.0* 9.3*  HCT 28.2* 27.6* 28.3*  MCV 93.4 94.8 94.3  PLT 264 278 947    Basic Metabolic Panel: Recent Labs  Lab 02/22/22 0742 02/22/22 1113 02/22/22 2342 02/23/22 0339 02/24/22 0316 02/25/22 0329 02/25/22 1950 02/26/22 0247  NA 131*   < >  128* 131* 137 141  --  137  K 4.2  --   --  4.3 3.4* 2.8* 3.6 3.3*  CL 103  --   --  101 107 109  --  103  CO2 19*  --   --  19* 21* 24  --  26  GLUCOSE 127*  --   --  119* 145* 103*  --  103*  BUN 25*  --   --  40* 33* 19  --  11  CREATININE 1.19*  --   --  1.91* 1.06* 0.62  --  0.57  CALCIUM 8.2*  --   --  8.4* 8.2* 7.9*  --  7.8*  MG  --   --   --  2.2 2.4 2.1  --   --   PHOS  --   --   --   --  3.0 1.5* 2.8 2.4*   < > = values in this interval not displayed.    Liver Function  Tests: Recent Labs  Lab 02/23/22 0339 02/24/22 0316 02/25/22 0329 02/26/22 0247  AST 26 22  --   --   ALT 22 24  --   --   ALKPHOS 73 81  --   --   BILITOT 1.6* 1.2  --   --   PROT 5.6* 5.4*  --   --   ALBUMIN 2.7* 2.6* 2.5* 2.6*    CBG: Recent Labs  Lab 02/20/22 0746  GLUCAP 149*    Microbiology Studies:   Recent Results (from the past 240 hour(s))  Culture, blood (Routine X 2) w Reflex to ID Panel     Status: None (Preliminary result)   Collection Time: 02/20/22  7:55 PM   Specimen: BLOOD  Result Value Ref Range Status   Specimen Description   Final    BLOOD LEFT ANTECUBITAL Performed at [facility], [place] [address], [place], [place] [address]    Special Requests   Final    BOTTLES DRAWN AEROBIC AND ANAEROBIC Blood Culture adequate volume Performed at [place] [address] [place], [place] [address]    Culture   Final    NO GROWTH 4 DAYS Performed at [facility], [place] [address] [place], [place] [address]    Report Status PENDING  Incomplete  Culture, blood (Routine X 2) w Reflex to ID Panel     Status: None (Preliminary result)   Collection Time: 02/20/22  8:07 PM   Specimen: BLOOD  Result Value Ref Range Status   Specimen Description   Final    BLOOD BLOOD RIGHT HAND Performed at [place] [address], [place], [place] [address]    Special Requests   Final    BOTTLES DRAWN AEROBIC ONLY Blood Culture results may not be optimal due to an inadequate volume of blood received in culture bottles Performed at [place] [address] [place], [place] [address]    Culture   Final    NO GROWTH 4 DAYS Performed at [facility], [place] [address], [place], [place] [address]    Report Status PENDING  Incomplete  Culture, blood (Routine X 2) w Reflex to ID Panel     Status: None (Preliminary result)   Collection Time: 02/23/22  2:10 PM   Specimen: BLOOD   Result Value Ref Range Status   Specimen Description   Final    BLOOD SITE NOT SPECIFIED Performed at [facility]  [place] [address] [place], [place] [address]    Special Requests   Final    BOTTLES DRAWN AEROBIC ONLY Blood Culture adequate volume Performed at [place] [address], [place], [place] [address]    Culture   Final    NO GROWTH 2 DAYS Performed at [place] [address], [place], [place] [address]    Report Status PENDING  Incomplete  Culture, blood (Routine X 2) w Reflex to ID Panel     Status: None (Preliminary result)   Collection Time: 02/23/22  2:10 PM   Specimen: BLOOD LEFT HAND  Result Value Ref Range Status   Specimen Description BLOOD LEFT HAND  Final   Special Requests   Final    BOTTLES DRAWN AEROBIC ONLY Blood Culture adequate volume   Culture   Final    NO GROWTH 2 DAYS Performed at [facility], [place] [address], [place], [place] [address]    Report Status PENDING  Incomplete  Urine Culture     Status: None (Preliminary result)   Collection Time: 02/23/22  3:05 PM   Specimen: Urine, Catheterized  Result Value Ref Range Status   Specimen Description   Final    URINE, CATHETERIZED Performed at [place] [address], [place], [place] [address]    Special Requests   Final    NONE Performed at [facility], [place] [address], [place], [place] [address]    Culture   Final    NO GROWTH Performed at [facility], [place] [address], [place], [place] [address]    Report Status PENDING  Incomplete  MRSA Next Gen by PCR, Nasal     Status: None   Collection Time: 02/23/22  5:49 PM   Specimen: Nasal Mucosa; Nasal Swab  Result Value Ref Range Status   MRSA by PCR Next Gen NOT DETECTED NOT DETECTED Final    Comment: (NOTE) The GeneXpert MRSA Assay (FDA approved for NASAL specimens only), is one component of a comprehensive MRSA colonization  surveillance program. It is not intended to diagnose MRSA infection nor to guide or monitor treatment for MRSA infections. Test performance is not FDA approved in patients less than 23 years old. Performed at [facility], [place] [address] [place], [place] [address]     Radiology Studies:  No results found.  Scheduled Meds:    amiodarone  400 mg Oral BID   Chlorhexidine Gluconate Cloth  6 each Topical Daily   ezetimibe  10 mg Oral Daily   feeding supplement  237 mL Oral BID BM   furosemide  40 mg Oral Daily   metoprolol tartrate  12.5 mg Oral BID   midodrine  5 mg Oral TID WC   pantoprazole  40 mg Oral Daily   PHENobarbital  97.2 mg Oral QHS   polyethylene glycol  17 g Oral Daily   predniSONE  20 mg Oral Q breakfast   senna-docusate  1 tablet Oral BID    Continuous Infusions:    sodium chloride     ceFEPime (MAXIPIME) IV Stopped (02/25/22 2135)   heparin 1,050 Units/hr (02/26/22 0600)   methocarbamol (ROBAXIN) IV     potassium PHOSPHATE IVPB (in mmol) 85 mL/hr at 02/26/22 0600     LOS: 5 days     Vernell Leep, MD,  Vernell Leep, MD,  FACP, [facility], [facility], [facility], [facility]   To contact the attending provider between 7A-7P or the covering provider during after hours 7P-7A, please log into the web site [email or web address] and access using universal [place] password for that web site. If you do not have the password, please call the hospital operator.  02/26/2022, 8:47 AM

## 2022-02-26 NOTE — Progress Notes (Addendum)
ANTICOAGULATION CONSULT NOTE  Pharmacy Consult for Heparin   Indication: Afib   Allergies  Allergen Reactions   Sulfa Antibiotics Nausea Only   Penicillins Swelling    Tolerated Cephalosporin 01/04/20      Simvastatin Swelling    Patient Measurements: Height: '5\' 1"'$  (154.9 cm) Weight: 72.1 kg (158 lb 15.2 oz) IBW/kg (Calculated) : 47.8 Heparin Dosing Weight: 65 kg  Vital Signs: Temp: 98.3 F (36.8 C) (09/22 1600) Temp Source: Oral (09/22 1600) BP: 139/111 (09/22 1400) Pulse Rate: 99 (09/22 1411)  Labs: Recent Labs    02/24/22 0316 02/25/22 0329 02/26/22 0247  HGB 9.3* 9.0* 9.3*  HCT 28.2* 27.6* 28.3*  PLT 264 278 281  HEPARINUNFRC 0.67 0.37 0.46  CREATININE 1.06* 0.62 0.57     Estimated Creatinine Clearance: 42.4 mL/min (by C-G formula based on SCr of 0.57 mg/dL).  Assessment: Active Problem(s): 9/13: s/p TKR   PMH: hypertension, GERD, arthritis, polymyalgia rheumatica, seizures   Assessment 33 yoF with PMH PAF not on anticoag, who experienced AFib/RVR after TKA. Today, plan was to transition to apixaban, but due to drug interaction with phenobarbital, keep on heparin for now pending further discussion    Today, 02/26/2022: Hgb low but stable; Plt stable WNL Age 88 years, Wt 72.1 kg, Scr < 1   Goal of Therapy:  Heparin level 0.3-0.7 units/ml Monitor platelets by anticoagulation protocol: Yes   Plan:  Continue heparin 1050 units/hr  Monitor HL, CBC  Monitor for signs and symptoms of bleeding     Royetta Asal, PharmD, BCPS 02/26/2022 4:53 PM

## 2022-02-26 NOTE — Progress Notes (Signed)
Physical Therapy Treatment Patient Details Name: [name] MRN: [id number] DOB: 04/19/1930 Today's Date: 02/26/2022   History of Present Illness Patient is a 88 y.o. female s/p L-TKA on 02/17/22. patient was noted to trasnfer to stepdown unit with crital value for sodium. PMH significant for seizures, HTN, GERD, breast cancer (2013), OA, polymyalgia rheumatica, R-TKA 2021, afib, HLD, RA, Required pressors and heparin for possible PE.    PT Comments    POD # 9 Pt seen in [facility] room # 26 Session was Co Tx with OT for pt safety/tolerance General bed mobility comments: rolling side to side Min Assist for peri care/hygiene due to Max amount watery stools in bed.  Supine to EOB Mod Asisst + 2 to complete with supporting L LE to avoid knee ROM and asisst with upper body. General transfer comment: First assisted from elevated bed to [facility] Mod Asisst + 2 1/4 pivot with 50% VC's on proper hand placement and transfer.  Applied KI while seated on BSC with towel added as inner layer for comfort/hygiene.  Second, assisted off BSC + 2 Mod Assist with posterior lean and 75% VC's on proper hand transfer to walker.  Unsteady.  HIGH FALL RISK. General Gait Details: VERY limited amb distance of 4 feet forward + 2 Max Asisst with walker/KI recliner following behind. Pt positioned in recliner with pillows for comfort.  Pt will need ST Rehab at SNF to address mobility and functional decline prior to safely returning home.   Recommendations for follow up therapy are one component of a multi-disciplinary discharge planning process, led by the attending physician.  Recommendations may be updated based on patient status, additional functional criteria and insurance authorization.  Follow Up Recommendations  Skilled nursing-short term rehab (<3 hours/day) Can patient physically be transported by private vehicle: No   Assistance Recommended at Discharge Frequent or constant Supervision/Assistance  Patient can  return home with the following Two people to help with walking and/or transfers;Two people to help with bathing/dressing/bathroom;Assistance with cooking/housework;Direct supervision/assist for medications management;Direct supervision/assist for financial management;Assist for transportation;Help with stairs or ramp for entrance   Equipment Recommendations  None recommended by PT    Recommendations for Other Services       Precautions / Restrictions Precautions Precautions: Fall Precaution Comments: no ROM to L knee, watch BP Required Braces or Orthoses: Knee Immobilizer - Left Knee Immobilizer - Left: On at all times Restrictions Weight Bearing Restrictions: No LLE Weight Bearing: Weight bearing as tolerated Other Position/Activity Restrictions: No Knee ROM for now anticagultion     Mobility  Bed Mobility Overal bed mobility: Needs Assistance Bed Mobility: Rolling, Supine to Sit Rolling: Min assist, +2 for physical assistance   Supine to sit: Mod assist, +2 for physical assistance, HOB elevated     General bed mobility comments: rolling side to side Min Assist for peri care/hygiene due to Max amount watery stools in bed.  Supine to EOB Mod Asisst + 2 to complete with supporting L LE to avoid knee ROM and asisst with upper body.    Transfers Overall transfer level: Needs assistance Equipment used: Rolling walker (2 wheels) Transfers: Sit to/from Stand, Bed to chair/wheelchair/BSC Sit to Stand: Mod assist, +2 physical assistance, Max assist, From elevated surface Stand pivot transfers: Mod assist, +2 physical assistance, +2 safety/equipment         General transfer comment: First assisted from elevated bed to [facility] Mod Asisst + 2 1/4 pivot with 50% VC's on proper hand placement and transfer.  Second, assisted off BSC + 2 Mod Assist with posterior lean and 75% VC's on proper hand transfer to walker.  Unsteady.  HIGH FALL RISK.    Ambulation/Gait Ambulation/Gait assistance:  Mod assist, +2 physical assistance, +2 safety/equipment Gait Distance (Feet): 4 Feet Assistive device: Rolling walker (2 wheels) Gait Pattern/deviations: Step-to pattern, Decreased step length - right, Decreased step length - left, Shuffle, Trunk flexed Gait velocity: decr     General Gait Details: VERY limited amb distance of 4 feet forward + 2 Max Asisst with walker/KI recliner following behind.   Stairs             Wheelchair Mobility    Modified Rankin (Stroke Patients Only)       Balance                                            Cognition Arousal/Alertness: Awake/alert Behavior During Therapy: WFL for tasks assessed/performed   Area of Impairment: Following commands, Attention                       Following Commands: Follows one step commands with increased time, Follows multi-step commands inconsistently       General Comments: AxO x 3 pleasant, following all directions.  Daughter who is a retired Marine scientist also in room and VERY helpful.        Exercises      General Comments        Pertinent Vitals/Pain Pain Assessment Pain Assessment: Faces Faces Pain Scale: Hurts a little bit Pain Location: L knee and stomach Pain Descriptors / Indicators: Discomfort, Grimacing, Guarding Pain Intervention(s): Monitored during session, Repositioned    Home Living                          Prior Function            PT Goals (current goals can now be found in the care plan section) Progress towards PT goals: Progressing toward goals    Frequency    7X/week      PT Plan Current plan remains appropriate    Co-evaluation PT/OT/SLP Co-Evaluation/Treatment: Yes Reason for Co-Treatment: For patient/therapist safety PT goals addressed during session: Mobility/safety with mobility        AM-PAC PT "6 Clicks" Mobility   Outcome Measure  Help needed turning from your back to your side while in a flat bed without  using bedrails?: A Lot Help needed moving from lying on your back to sitting on the side of a flat bed without using bedrails?: A Lot Help needed moving to and from a bed to a chair (including a wheelchair)?: A Lot Help needed standing up from a chair using your arms (e.g., wheelchair or bedside chair)?: A Lot Help needed to walk in hospital room?: A Lot Help needed climbing 3-5 steps with a railing? : Total 6 Click Score: 11    End of Session Equipment Utilized During Treatment: Gait belt Activity Tolerance: Patient limited by fatigue Patient left: in chair;with call bell/phone within reach;with chair alarm set;with family/visitor present;with nursing/sitter in room Nurse Communication: Mobility status PT Visit Diagnosis: Difficulty in walking, not elsewhere classified (R26.2);Unsteadiness on feet (R26.81);Dizziness and giddiness (R42) Pain - Right/Left: Left Pain - part of body: Knee     Time: 0932-3557 PT Time Calculation (min) (ACUTE ONLY):  39 min  Charges:  $Gait Training: 8-22 mins $Therapeutic Activity: 8-22 mins                     Rica Koyanagi  PTA [place] [facility] M-F          [phone] Weekend pager [phone]

## 2022-02-26 NOTE — Progress Notes (Signed)
Patient ID: [name], female   DOB: January 29, 1930, 88 y.o.   MRN: [id number] Patient with her daughter. Awake O by 4 . Looks much better. OK to start eloquis .When off heparin resume regular post op prototcol.  Agree SNF when stable and ready. Answered and ecouraged all questions with patient and her daughter. Appreciate everyones help in her care.

## 2022-02-26 NOTE — Progress Notes (Signed)
Occupational Therapy Treatment Patient Details Name: [name] MRN: [id number] DOB: 08-07-1929 Today's Date: 02/26/2022   History of present illness Patient is a 88 y.o. female s/p L-TKA on 02/17/22. patient was noted to trasnfer to stepdown unit with crital value for sodium. PMH significant for seizures, HTN, GERD, breast cancer (2013), OA, polymyalgia rheumatica, R-TKA 2021, afib, HLD, RA, Required pressors and heparin for possible PE.   OT comments  Patient seen for co-treat to ensure her safe participation in progressive functional activity, given the need for increased physical assistance with out of bed ADLs and transfers. She was noted to have a few episodes of loose stools, requiring assist for clean-up at bed level first, then at bedside commode level. She required mod-max assist x2 using a RW for sit to stand & stand-step transfers, from the EOB then bedside commode. She required significant assist x2 for toileting management at bedside commode level. She presented with good effort and participation, and she is making gradual functional progress. She will continue to benefit from further OT services to facilitate progressive ADL performance & to decrease the risk for further weakness and deconditioning.    Recommendations for follow up therapy are one component of a multi-disciplinary discharge planning process, led by the attending physician.  Recommendations may be updated based on patient status, additional functional criteria and insurance authorization.    Follow Up Recommendations  Skilled nursing-short term rehab (<3 hours/day)    Assistance Recommended at Discharge Frequent or constant Supervision/Assistance  Patient can return home with the following  Direct supervision/assist for medications management;Help with stairs or ramp for entrance;Assist for transportation;Assistance with cooking/housework;A lot of help with bathing/dressing/bathroom;A lot of help with walking  and/or transfers         Precautions / Restrictions Precautions Precautions: Fall Required Braces or Orthoses: Knee Immobilizer - Left Restrictions LLE Weight Bearing: Weight bearing as tolerated Other Position/Activity Restrictions: No Knee ROM for now       Mobility Bed Mobility Overal bed mobility: Needs Assistance Bed Mobility: Supine to Sit, Rolling Rolling: Min assist   Supine to sit: Mod assist, +2 for physical assistance, HOB elevated     General bed mobility comments: for supine to sit, she required cues for advancing B LE, with physical assist required for L LE management    Transfers Overall transfer level: Needs assistance Equipment used: Rolling walker (2 wheels) Transfers: Sit to/from Stand Sit to Stand: Mod assist, +2 physical assistance, Max assist, From elevated surface (mod assist x2 from bed; max assist x2 for bedside commode)     Step pivot transfers: Mod assist, +2 physical assistance     General transfer comment: instruction provided on RW positioning and step sequencing         ADL either performed or assessed with clinical judgement   ADL Overall ADL's : Needs assistance/impaired Eating/Feeding: Set up Eating/Feeding Details (indicate cue type and reason): based on clinical judgement             Upper Body Dressing : Moderate assistance;Bed level Upper Body Dressing Details (indicate cue type and reason): She required assist for doffing a hospital gown, then to don another clean one in the semi-fowler's position Lower Body Dressing: Total assistance   Toilet Transfer: Stand-pivot;+2 for safety/equipment;+2 for physical assistance;Moderate assistance;Maximal assistance;BSC/3in1;Cueing for sequencing   Toileting- Clothing Manipulation and Hygiene: Maximal assistance;+2 for physical assistance Toileting - Clothing Manipulation Details (indicate cue type and reason): ADL instruction was provided for toileting at bedside commode level,  for  which she required steadying/balance assist in standing, cues for B LE positioning in standing, max assist for clothing management, cues for toilet transfer technique, and total assist for hygiene in standing after a bowel movement.              Cognition Arousal/Alertness: Awake/alert Behavior During Therapy: WFL for tasks assessed/performed Overall Cognitive Status: Within Functional Limits for tasks assessed                                   Pertinent Vitals/ Pain       Pain Assessment Pain Assessment: 0-10 Pain Score: 3  Pain Location: L knee and stomach Pain Intervention(s): Limited activity within patient's tolerance, Repositioned         Frequency  Min 2X/week        Progress Toward Goals  OT Goals(current goals can now be found in the care plan section)  Progress towards OT goals: Progressing toward goals   Acute Rehab OT Goals OT Goal Formulation: With patient Time For Goal Achievement: 03/08/22 Potential to Achieve Goals: Good  Plan      Co-evaluation    PT/OT/SLP Co-Evaluation/Treatment: Yes Reason for Co-Treatment: To address functional/ADL transfers   OT goals addressed during session: ADL's and self-care      AM-PAC OT "6 Clicks" Daily Activity     Outcome Measure   Help from another person eating meals?: None Help from another person taking care of personal grooming?: A Little Help from another person toileting, which includes using toliet, bedpan, or urinal?: A Lot Help from another person bathing (including washing, rinsing, drying)?: A Lot Help from another person to put on and taking off regular upper body clothing?: A Lot Help from another person to put on and taking off regular lower body clothing?: Total 6 Click Score: 14    End of Session Equipment Utilized During Treatment: Gait belt;Rolling walker (2 wheels)  OT Visit Diagnosis: Unsteadiness on feet (R26.81);Muscle weakness (generalized) (M62.81)   Activity  Tolerance Patient tolerated treatment well   Patient Left with call bell/phone within reach;in chair;with family/visitor present   Nurse Communication  (Nurse cleared the pt for participation in the session)        Time: 4503-8882 OT Time Calculation (min): 39 min  Charges: OT General Charges $OT Visit: 1 Visit OT Treatments $Self Care/Home Management : 8-22 mins    Leota Sauers, OTR/L 02/26/2022, 10:24 AM

## 2022-02-27 DIAGNOSIS — I4891 Unspecified atrial fibrillation: Secondary | ICD-10-CM | POA: Diagnosis not present

## 2022-02-27 DIAGNOSIS — D72829 Elevated white blood cell count, unspecified: Secondary | ICD-10-CM | POA: Diagnosis not present

## 2022-02-27 DIAGNOSIS — R197 Diarrhea, unspecified: Secondary | ICD-10-CM

## 2022-02-27 LAB — URINE CULTURE: Culture: NO GROWTH

## 2022-02-27 LAB — RENAL FUNCTION PANEL
Albumin: 2.9 g/dL — ABNORMAL LOW (ref 3.5–5.0)
Anion gap: 12 (ref 5–15)
BUN: 13 mg/dL (ref 8–23)
CO2: 25 mmol/L (ref 22–32)
Calcium: 8.5 mg/dL — ABNORMAL LOW (ref 8.9–10.3)
Chloride: 101 mmol/L (ref 98–111)
Creatinine, Ser: 0.75 mg/dL (ref 0.44–1.00)
GFR, Estimated: >60 mL/min (ref >60)
Glucose, Bld: 93 mg/dL (ref 70–99)
Phosphorus: 3.3 mg/dL (ref 2.5–4.6)
Potassium: 4.5 mmol/L (ref 3.5–5.1)
Sodium: 138 mmol/L (ref 135–145)

## 2022-02-27 LAB — CBC
HCT: 33.3 % — ABNORMAL LOW (ref 36.0–46.0)
Hemoglobin: 10.3 g/dL — ABNORMAL LOW (ref 12.0–15.0)
MCH: 30.4 pg (ref 26.0–34.0)
MCHC: 30.9 g/dL (ref 30.0–36.0)
MCV: 98.2 fL (ref 80.0–100.0)
Platelets: 276 10*3/uL (ref 150–400)
RBC: 3.39 MIL/uL — ABNORMAL LOW (ref 3.87–5.11)
RDW: 15.9 % — ABNORMAL HIGH (ref 11.5–15.5)
WBC: 24.7 10*3/uL — ABNORMAL HIGH (ref 4.0–10.5)
nRBC: 0.6 % — ABNORMAL HIGH (ref 0.0–0.2)

## 2022-02-27 LAB — HEPARIN LEVEL (UNFRACTIONATED): Heparin Unfractionated: 0.36 IU/mL (ref 0.30–0.70)

## 2022-02-27 MED ORDER — PREDNISONE 5 MG PO TABS
10.0000 mg | ORAL_TABLET | Freq: Every day | ORAL | Status: DC
  Administered 2022-02-27 – 2022-02-28 (×2): 10 mg via ORAL
  Filled 2022-02-27 (×2): qty 2

## 2022-02-27 MED ORDER — LOPERAMIDE HCL 2 MG PO CAPS
2.0000 mg | ORAL_CAPSULE | Freq: Three times a day (TID) | ORAL | Status: DC | PRN
  Administered 2022-02-27: 2 mg via ORAL
  Filled 2022-02-27: qty 1

## 2022-02-27 MED ORDER — WARFARIN - PHARMACIST DOSING INPATIENT: Freq: Every day | Status: DC

## 2022-02-27 MED ORDER — WARFARIN SODIUM 6 MG PO TABS
6.0000 mg | ORAL_TABLET | Freq: Once | ORAL | Status: AC
  Administered 2022-02-27: 6 mg via ORAL
  Filled 2022-02-27: qty 1

## 2022-02-27 MED ORDER — LOPERAMIDE HCL 2 MG PO CAPS: 2.0000 mg | ORAL_CAPSULE | ORAL | Status: DC | PRN

## 2022-02-27 NOTE — Plan of Care (Signed)
  Problem: Education: Goal: Knowledge of General Education information will improve Description: Including pain rating scale, medication(s)/side effects and non-pharmacologic comfort measures Outcome: Progressing   Problem: Pain Managment: Goal: General experience of comfort will improve Outcome: Progressing   Problem: Safety: Goal: Ability to remain free from injury will improve Outcome: Progressing   

## 2022-02-27 NOTE — Progress Notes (Signed)
ANTICOAGULATION CONSULT NOTE - Follow Up Consult  Pharmacy Consult for Heparin Indication: atrial fibrillation  Allergies  Allergen Reactions   Sulfa Antibiotics Nausea Only   Penicillins Swelling    Tolerated Cephalosporin 01/04/20      Simvastatin Swelling    Patient Measurements: Height: '5\' 1"'$  (154.9 cm) Weight: 72.2 kg (159 lb 2.8 oz) IBW/kg (Calculated) : 47.8  Vital Signs: Temp: 98.8 F (37.1 C) (09/23 0516) BP: 135/82 (09/23 0516) Pulse Rate: 105 (09/23 0516)  Labs: Recent Labs    02/25/22 0329 02/26/22 0247 02/27/22 0322  HGB 9.0* 9.3* 10.3*  HCT 27.6* 28.3* 33.3*  PLT 278 281 276  HEPARINUNFRC 0.37 0.46 0.36  CREATININE 0.62 0.57 0.75    Estimated Creatinine Clearance: 42.5 mL/min (by C-G formula based on SCr of 0.75 mg/dL).   Assessment:  AC/Heme:  ABLA post op> ortho rec 2 U PRBC 9/17 (hgb 7.9) d/t lethargy. Post-op (TKR)  Afib with CHADS2VASC 4. - Hep level 0.36 in goal. Hgb 10.3 up, Plts WNL. Start  warfarin due to Drug-drug interaction with phenobarb (enzyme inducer).   Goal of Therapy:  Heparin level 0.3-0.7 units/ml INR 2-3 Monitor platelets by anticoagulation protocol: Yes   Plan:  Heparin infusion 1050 units/hr (16 units/kg/hr) with post-op anemia Daily HL and CBC and INR Start Coumadin '6mg'$  po x 1 tonight  Rasheen Bells S. Alford Highland, PharmD, BCPS Clinical Staff Pharmacist [email or web address]  Alford Highland, Tristain Daily Stillinger 02/27/2022,11:35 AM

## 2022-02-27 NOTE — Progress Notes (Signed)
ANTICOAGULATION CONSULT NOTE  Pharmacy Consult for Heparin   Indication: Afib   Allergies  Allergen Reactions   Sulfa Antibiotics Nausea Only   Penicillins Swelling    Tolerated Cephalosporin 01/04/20      Simvastatin Swelling    Patient Measurements: Height: '5\' 1"'$  (154.9 cm) Weight: 72.1 kg (158 lb 15.2 oz) IBW/kg (Calculated) : 47.8 Heparin Dosing Weight: 65 kg  Vital Signs: Temp: 100.1 F (37.8 C) (09/22 2209) Temp Source: Oral (09/22 2209) BP: 146/79 (09/22 2209) Pulse Rate: 99 (09/22 2209)  Labs: Recent Labs    02/25/22 0329 02/26/22 0247 02/27/22 0322  HGB 9.0* 9.3* 10.3*  HCT 27.6* 28.3* 33.3*  PLT 278 281 276  HEPARINUNFRC 0.37 0.46 0.36  CREATININE 0.62 0.57 0.75     Estimated Creatinine Clearance: 42.4 mL/min (by C-G formula based on SCr of 0.75 mg/dL).  Assessment: Active Problem(s): 9/13: s/p TKR   PMH: hypertension, GERD, arthritis, polymyalgia rheumatica, seizures   Assessment 88 yoF with PMH PAF not on anticoag, who experienced AFib/RVR after TKA. Today, plan was to transition to apixaban, but due to drug interaction with phenobarbital, keep on heparin for now pending further discussion    Today, 02/27/2022: HL 0.36 therapeutic on 1050 units/hr Hgb low but stable; Plt stable WNL Age 88 years, Wt 72.1 kg, Scr < 1   Goal of Therapy:  Heparin level 0.3-0.7 units/ml Monitor platelets by anticoagulation protocol: Yes   Plan:  Continue heparin 1050 units/hr  Monitor HL, CBC  Monitor for signs and symptoms of bleeding     Dolly Rias RPh 02/27/2022, 4:45 AM

## 2022-02-27 NOTE — Progress Notes (Signed)
PROGRESS NOTE   SHEETAL Allen  VQM:[id number]    DOB: [birth year]    DOA: 02/17/2022  PCP: [name], MD   I have briefly reviewed patients previous medical records in [facility].   Brief Narrative:  PCCM >TRH for continued medical consult 02/25/2022: 88 year old female underwent left TKA by Dr. [name] on 02/17/2022.  9/16, noted to have AMS with hypersomnolence and labs revealed an acute drop in her sodium to 116, previously 136.  TRH and nephrology were consulted and she was started on 3% normal saline.  On 9/17, she developed A-fib with RVR, cardiology was consulted, started on Cardizem and converted to NSR.  9/18, IV antibiotics initiated for left lower lobe atelectasis versus infiltrate.  9/19, went back into A-fib with RVR, started on amiodarone bolus and infusion, developed transient shock that resolved after fluid bolus, briefly required low-dose norepinephrine and steroids by PCCM.  She was started on heparin infusion for PE prophylaxis.  DVT negative on ultrasound.  Cannot use DOAC's because of being on phenobarbital and which reduces DOAC's efficacy.  Thereby after discussing with patient/family, initiated warfarin with heparin bridging.  Laxative related diarrhea.  DC to SNF pending therapeutic INR.   Assessment & Plan:  Principal Problem:   Osteoarthritis of left knee Active Problems:   Acute metabolic encephalopathy   Acute hyponatremia   HCAP (healthcare-associated pneumonia)   AKI (acute kidney injury) ([place])   Hypotension   Paroxysmal atrial fibrillation with RVR (HCC)   PMR (polymyalgia rheumatica) (HCC)   DNR (do not resuscitate)   Hypotension - resolved Likely result of Amiodarone infusion d/t the timing.  Potential HCAP contributing.  Briefly required pressors in ICU, has been off for several days.  Resolved.  Midodrine quickly tapered and discontinued.  Tolerating oral amiodarone.  Weaning steroids back to her chronic home dose.  Paroxysmal  Atrial Fibrillation Hx HTN, MVR, HTN, HLD Initially given Cardizem but converted and later started on amiodarone, subsequently developed hypotension.   -goal K+>4, Mg+>2  -appreciate Cardiology.  Continue oral amiodarone and IV heparin drip. -continue heparin infusion bridging until INR therapeutic on warfarin-started 9/23. -Orthopedics has cleared to start oral anticoagulation. - Cannot use DOAC's because of being on phenobarbital long-term, and which reduces DOAC's efficacy.  Thereby initiated warfarin with heparin bridging. -Eventually as outpatient, can follow-up with Neurology and if neurology is able to switch her from phenobarbital to an alternate agent without interactions with DOAC's, then she can be switched from warfarin to DOAC's.  This was discussed in detail with patient's twin daughters at bedside and they verbalized understanding.   HCAP vs Atelectasis Acute Hypoxic Respiratory Failure CXR c/f LLL infiltrate vs atelectasis.  Newly required O2 via [place], since off and on RA.  DVT r/o. CTA initially not feasible d/t AKI to r/o PE.  Started on heparin infusion for Afib.  Started 9/23. -suspect hypoxia driven by atelectasis, post surgical.  Less likely PE -D5/x abx.  Encourage incentive spirometry.  Mobilize.  Due to diarrhea, leukocytosis and has already completed almost 5 days course, cefepime discontinued 9/23. -Communicated with Dr. Verlee Monte, PCCM on 9/21 and if she is going to be on anticoagulation anyway for A-fib then agrees that we can defer CTA chest.  Also low index of suspicion for PE.    Acute diarrhea: Started after aggressive bowel management for constipation.  Discontinued all laxatives.  Trial of Imodium briefly.  If diarrhea persists and leukocytosis continues to worsen then need to check for C. difficile.  AKI Hyponatremia Acute urinary retention Resolved, no longer on 3% NS as of 9/18 -appreciate Nephrology, signed off.  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  They felt AKI was related to bladder retention, bladder scan showed 900 mL and Foley catheter was placed. -Has great urine output.  Discontinued IV fluids. -Replace electrolytes as indicated -Avoid nephrotoxic agents, ensure adequate renal perfusion -Foley catheter discontinued 9/21 and patient voiding without difficulty.  Hypokalemia: Continue to replace as needed and follow.  Hypophosphatemia: Replaced.  Leukocytosis: Likely multifactorial related to infectious etiology, steroids and stress.  Upon chart review, has chronic mild leukocytosis in the 11-12 range.  Slightly higher than yesterday.  As noted above, if keeps worsening and continues to have diarrhea, check for C. difficile.  Normocytic anemia: Stable   Acute Urinary Retention  -Resolved.  Discontinued Foley catheter and voiding without difficulty.   Acute Metabolic Encephalopathy Likely related to hypotension, hyponatremia > as BP improved so did mental status.   -supportive care -limit sedating medications  -delirium precautions  -Resolved.   Hx Seizures -continue home phenobarbital.  As per patient and daughters at bedside, has been on this for 50+ years.  Last seizure in 1987.   Post op L TKA -post op mgmt per Ortho  -reduce pain regimen > limited use, encephalopathy risk  -Discussed with Dr. Lyla Glassing 9/23: DC to be delayed pending INR therapeutic.  Osteoarthritis/rheumatoid arthritis Patient on chronic prednisone 5 Mg daily.  Weaning off steroids to prior home dose.    Body mass index is 30.08 kg/m.    DVT prophylaxis: SCDs Start: 02/17/22 1101     Code Status: DNR:  Family Communication: Discussed in detail with patient's twin daughters at bedside today, updated care and answered all questions.   Disposition:  Status is: Inpatient DC to SNF  pending improvement in diarrhea and therapeutic INR.     Consultants:   PCCM Cardiology Nephrology  Procedures:   As noted above  Antimicrobials:   IV cefepime 9/19 > 9/23 IV vancomycin x1 and metronidazole x1 month 9/19.   Subjective:  Ongoing diarrhea, watery.  No abdominal pain.  Asking for laxatives to be stopped.  Imodium already ordered by orthopedic MD.  Objective:   Vitals:   02/26/22 2139 02/26/22 2209 02/27/22 0500 02/27/22 0516  BP:  (!) 146/79  135/82  Pulse: (!) 112 99  (!) 105  Resp: (!) '24 18  18  '$ Temp:  100.1 F (37.8 C)  98.8 F (37.1 C)  TempSrc:  Oral    SpO2: 91% 95%  92%  Weight:   72.2 kg   Height:        General exam: Elderly female, moderately built and nourished lying comfortably supine in bed without distress.  Oral mucosa moist. Respiratory system: Clear to auscultation.  No increased work of breathing. Cardiovascular system: S1 & S2 heard, RRR. No JVD, murmurs, rubs, gallops or clicks.  RLE without edema.  Trace left leg edema (postop).  Although transfer to telemetry was ordered from SDU, was not on telemetry.  Discussed with charge RN to place on telemetry. Gastrointestinal system: Abdomen is nondistended, soft and nontender. No organomegaly or masses felt. Normal bowel sounds heard. Central nervous system: Alert and oriented. No focal neurological deficits. Extremities: Symmetric 5 x 5 power.  Left knee postop splint has been removed.  Surgical site without acute findings.  Left knee and lower leg with extensive bruising from surgery.  Left forearm bruising without overt bleeding. Skin: No rashes, lesions or ulcers Psychiatry: Judgement and insight appear normal. Mood & affect appropriate.     Data Reviewed:   I have personally reviewed following labs and imaging studies   CBC: Recent Labs  Lab 02/25/22 0329 02/26/22 0247 02/27/22 0322  WBC 22.3* 23.0* 24.7*  HGB 9.0* 9.3* 10.3*  HCT 27.6* 28.3* 33.3*  MCV 94.8 94.3 98.2  PLT  278 281 585    Basic Metabolic Panel: Recent Labs  Lab 02/23/22 0339 02/24/22 0316 02/25/22 0329 02/25/22 1950 02/26/22 0247 02/27/22 0322  NA 131* 137 141  --  137 138  K 4.3 3.4* 2.8* 3.6 3.3* 4.5  CL 101 107 109  --  103 101  CO2 19* 21* 24  --  26 25  GLUCOSE 119* 145* 103*  --  103* 93  BUN 40* 33* 19  --  11 13  CREATININE 1.91* 1.06* 0.62  --  0.57 0.75  CALCIUM 8.4* 8.2* 7.9*  --  7.8* 8.5*  MG 2.2 2.4 2.1  --   --   --   PHOS  --  3.0 1.5* 2.8 2.4* 3.3    Liver Function Tests: Recent Labs  Lab 02/23/22 0339 02/24/22 0316 02/25/22 0329 02/26/22 0247 02/27/22 0322  AST 26 22  --   --   --   ALT 22 24  --   --   --   ALKPHOS 73 81  --   --   --   BILITOT 1.6* 1.2  --   --   --   PROT 5.6* 5.4*  --   --   --   ALBUMIN 2.7* 2.6* 2.5* 2.6* 2.9*    CBG: No results for input(s): "GLUCAP" in the last 168 hours.   Microbiology Studies:   Recent Results (from the past 240 hour(s))  Culture, blood (Routine X 2) w Reflex to ID Panel     Status: None   Collection Time: 02/20/22  7:55 PM   Specimen: BLOOD  Result Value Ref Range Status   Specimen Description   Final    BLOOD LEFT ANTECUBITAL Performed at [facility] [address], [place], [place] [address]    Special Requests   Final    BOTTLES DRAWN AEROBIC AND ANAEROBIC Blood Culture adequate volume Performed at [facility] [address] [place], [place] [address]    Culture   Final    NO GROWTH 5 DAYS Performed at [facility], [place] [address] [place], [place] [address]    Report Status 02/26/2022 FINAL  Final  Culture, blood (Routine X 2) w Reflex to ID Panel     Status: None   Collection Time: 02/20/22  8:07 PM   Specimen: BLOOD  Result Value Ref Range Status   Specimen Description   Final    BLOOD BLOOD RIGHT HAND Performed at [facility] [address] [place], [place] [address]    Special Requests   Final    BOTTLES  DRAWN AEROBIC ONLY Blood Culture results may not be optimal due to an inadequate volume of blood received in culture bottles Performed at [facility] [address], [place], [place] [address]    Culture   Final    NO GROWTH 5 DAYS Performed at [facility], [place] [address], [place], [place] [address]  Report Status 02/26/2022 FINAL  Final  Culture, blood (Routine X 2) w Reflex to ID Panel     Status: None (Preliminary result)   Collection Time: 02/23/22  2:10 PM   Specimen: BLOOD  Result Value Ref Range Status   Specimen Description   Final    BLOOD SITE NOT SPECIFIED Performed at [facility] [address], [place], [place] [address]    Special Requests   Final    BOTTLES DRAWN AEROBIC ONLY Blood Culture adequate volume Performed at [facility] [address] [place], [place] [address]    Culture   Final    NO GROWTH 4 DAYS Performed at [facility], [place] [address], [place], [place] [address]    Report Status PENDING  Incomplete  Culture, blood (Routine X 2) w Reflex to ID Panel     Status: None (Preliminary result)   Collection Time: 02/23/22  2:10 PM   Specimen: BLOOD LEFT HAND  Result Value Ref Range Status   Specimen Description BLOOD LEFT HAND  Final   Special Requests   Final    BOTTLES DRAWN AEROBIC ONLY Blood Culture adequate volume   Culture   Final    NO GROWTH 4 DAYS Performed at [facility], [place] [address] [place] [address], [place], [place] [address]    Report Status PENDING  Incomplete  Urine Culture     Status: None (Preliminary result)   Collection Time: 02/23/22  3:05 PM   Specimen: Urine, Catheterized  Result Value Ref Range Status   Specimen Description   Final    URINE, CATHETERIZED Performed at [facility] [address] [place], [place] [address]    Special Requests   Final    NONE Performed at [facility], [place] [address] [place],  [place] [address]    Culture   Final    NO GROWTH Performed at [facility], [place] [address] [place], [place] [address]    Report Status PENDING  Incomplete  MRSA Next Gen by PCR, Nasal     Status: None   Collection Time: 02/23/22  5:49 PM   Specimen: Nasal Mucosa; Nasal Swab  Result Value Ref Range Status   MRSA by PCR Next Gen NOT DETECTED NOT DETECTED Final    Comment: (NOTE) The GeneXpert MRSA Assay (FDA approved for NASAL specimens only), is one component of a comprehensive MRSA colonization surveillance program. It is not intended to diagnose MRSA infection nor to guide or monitor treatment for MRSA infections. Test performance is not FDA approved in patients less than 59 years old. Performed at [facility], [place] [address], [place], [place] [address]     Radiology Studies:  No results found.  Scheduled Meds:    amiodarone  400 mg Oral BID   Chlorhexidine Gluconate Cloth  6 each Topical Daily   ezetimibe  10 mg Oral Daily   feeding supplement  237 mL Oral BID BM   furosemide  40 mg Oral Daily   metoprolol tartrate  12.5 mg Oral BID   pantoprazole  40 mg Oral Daily   PHENobarbital  97.2 mg Oral QHS   predniSONE  10 mg Oral Q breakfast   warfarin  6 mg Oral ONCE-1600   Warfarin - Pharmacist Dosing Inpatient   Does not apply q1600    Continuous Infusions:    sodium chloride     heparin 1,050 Units/hr (02/27/22 0600)   methocarbamol (  ROBAXIN) IV       LOS: 6 days     Vernell Leep, MD,  Vernell Leep, MD,  FACP, [facility], [facility], [facility], [place]   To contact the attending provider between 7A-7P or the covering provider during after hours 7P-7A, please log into the web site [email or web address] and access using universal [place] password for that web site. If you do not have the password, please call the hospital operator.  02/27/2022, 1:43 PM

## 2022-02-27 NOTE — Progress Notes (Signed)
Pt not being able to pee with purewick in place. Complains of some discomfort in her lower abdomen. Bladder scanned more than 999 ml urine. Assisted to [facility] and was able to urinate 1100 ml . States she feels relief  after it. Pt aware of in and out cath if needed. Pt agreeable not to use purwick and use  BSC when needed.

## 2022-02-27 NOTE — Progress Notes (Signed)
Physical Therapy Treatment Patient Details Name: [name] MRN: [id number] DOB: [birth year] Today's Date: 02/27/2022   History of Present Illness Patient is a 88 y.o. female s/p L-TKA on 02/17/22. patient was noted to trasnfer to stepdown unit with crital value for sodium. PMH significant for seizures, HTN, GERD, breast cancer (2013), OA, polymyalgia rheumatica, R-TKA 2021, afib, HLD, RA, Required pressors and heparin for possible PE.    PT Comments    POD # 10 Transferred out of ICU pt seen in room 1332 General Comments: AxO x 3 "this is the best she has looked".  Following all commands and motivated. Assisted OOB to [facility] then amb in hallway still required + 2 assist.   General bed mobility comments: Min Assist to support L LE and Mod Assist to complete scooting to EOB which was difficult. General transfer comment: First assisted from elevated bed to [facility] Mod Asisst + 2 1/4 pivot with 50% VC's on proper hand placement and transfer.  Second, assisted off BSC + 2 Mod Assist with posterior lean and 75% VC's on proper hand transfer to walker.  Unsteady.  HIGH FALL RISK. Pt still having diarrhea.  General Gait Details: tolerated an increased distance of 12 feet + 2 asisst for safety such that recliner was following.  NO KI. Reported "minimal" knee pain. Then returned to room to perform some GENTLE AAROM TE's following HEP handout.  Instructed on proper tech, freq as well as use of ICE.   Pt will need ST Rehab at SNF to address mobility and functional decline prior to safely returning home.     Recommendations for follow up therapy are one component of a multi-disciplinary discharge planning process, led by the attending physician.  Recommendations may be updated based on patient status, additional functional criteria and insurance authorization.  Follow Up Recommendations  Skilled nursing-short term rehab (<3 hours/day) Can patient physically be transported by private vehicle: No    Assistance Recommended at Discharge Frequent or constant Supervision/Assistance  Patient can return home with the following Two people to help with walking and/or transfers;Two people to help with bathing/dressing/bathroom;Assistance with cooking/housework;Direct supervision/assist for medications management;Direct supervision/assist for financial management;Assist for transportation;Help with stairs or ramp for entrance   Equipment Recommendations  None recommended by PT    Recommendations for Other Services       Precautions / Restrictions Precautions Precautions: Fall Precaution Comments: "When off heparin resume regular post op prototcol" per ortho Restrictions Weight Bearing Restrictions: No LLE Weight Bearing: Weight bearing as tolerated Other Position/Activity Restrictions: may resume ROM when off Heparin     Mobility  Bed Mobility Overal bed mobility: Needs Assistance Bed Mobility: Supine to Sit     Supine to sit: Min assist, Mod assist     General bed mobility comments: Min Assist to support L LE and Mod Assist to complete scooting to EOB which was difficult.    Transfers Overall transfer level: Needs assistance Equipment used: Rolling walker (2 wheels) Transfers: Sit to/from Stand, Bed to chair/wheelchair/BSC Sit to Stand: Mod assist, Min assist, +2 physical assistance, +2 safety/equipment Stand pivot transfers: Mod assist, +2 physical assistance, +2 safety/equipment         General transfer comment: First assisted from elevated bed to [facility] Mod Asisst + 2 1/4 pivot with 50% VC's on proper hand placement and transfer.  Second, assisted off BSC + 2 Mod Assist with posterior lean and 75% VC's on proper hand transfer to walker.  Unsteady.  HIGH FALL RISK.  Ambulation/Gait Ambulation/Gait assistance: Mod assist, +2 physical assistance, +2 safety/equipment Gait Distance (Feet): 12 Feet Assistive device: Rolling walker (2 wheels) Gait Pattern/deviations: Step-to  pattern, Decreased step length - right, Decreased step length - left, Shuffle, Trunk flexed Gait velocity: decr     General Gait Details: tolerated an increased distance of 12 feet + 2 asisst for safety such that recliner was following.  NO KI. Reported "minimal" knee pain.   Stairs             Wheelchair Mobility    Modified Rankin (Stroke Patients Only)       Balance                                            Cognition Arousal/Alertness: Awake/alert Behavior During Therapy: WFL for tasks assessed/performed Overall Cognitive Status: Within Functional Limits for tasks assessed                                 General Comments: AxO x 3 "this is the best she has looked".  Following all commands and motivated.        Exercises  05 reps GENTLE AAROM some TE's     General Comments        Pertinent Vitals/Pain Pain Assessment Pain Assessment: Faces Faces Pain Scale: Hurts a little bit Pain Location: L knee "not bad" Pain Descriptors / Indicators: Discomfort, Grimacing, Guarding, Operative site guarding Pain Intervention(s): Monitored during session, Premedicated before session, Repositioned, Ice applied    Home Living                          Prior Function            PT Goals (current goals can now be found in the care plan section) Progress towards PT goals: Progressing toward goals    Frequency    7X/week      PT Plan Current plan remains appropriate    Co-evaluation              AM-PAC PT "6 Clicks" Mobility   Outcome Measure  Help needed turning from your back to your side while in a flat bed without using bedrails?: A Lot Help needed moving from lying on your back to sitting on the side of a flat bed without using bedrails?: A Lot Help needed moving to and from a bed to a chair (including a wheelchair)?: A Lot Help needed standing up from a chair using your arms (e.g., wheelchair or bedside  chair)?: A Lot Help needed to walk in hospital room?: A Lot Help needed climbing 3-5 steps with a railing? : Total 6 Click Score: 11    End of Session Equipment Utilized During Treatment: Gait belt Activity Tolerance: Patient tolerated treatment well Patient left: in chair;with call bell/phone within reach;with chair alarm set Nurse Communication: Mobility status PT Visit Diagnosis: Difficulty in walking, not elsewhere classified (R26.2);Unsteadiness on feet (R26.81);Dizziness and giddiness (R42) Pain - Right/Left: Left Pain - part of body: Knee     Time: [id number] PT Time Calculation (min) (ACUTE ONLY): 25 min  Charges:  $Gait Training: 8-22 mins $Therapeutic Activity: 8-22 mins                     {Malania Gawthrop  PTA Acute  Rehabilitation Services Office M-F          [phone] Weekend pager [phone]

## 2022-02-27 NOTE — Progress Notes (Addendum)
    Subjective:  Patient reports pain as mild to moderate.  Denies N/V/CP/SOB. Having diarrhea after aggressive bowel regimen given for constipation.  Objective:   VITALS:   Vitals:   02/26/22 2139 02/26/22 2209 02/27/22 0500 02/27/22 0516  BP:  (!) 146/79  135/82  Pulse: (!) 112 99  (!) 105  Resp: (!) '24 18  18  '$ Temp:  100.1 F (37.8 C)  98.8 F (37.1 C)  TempSrc:  Oral    SpO2: 91% 95%  92%  Weight:   72.2 kg   Height:        NAD ABD soft Sensation intact distally Intact pulses distally Dorsiflexion/Plantar flexion intact Incision: scant drainage Compartment soft   Lab Results  Component Value Date   WBC 24.7 (H) 02/27/2022   HGB 10.3 (L) 02/27/2022   HCT 33.3 (L) 02/27/2022   MCV 98.2 02/27/2022   PLT 276 02/27/2022   BMET    Component Value Date/Time   NA 138 02/27/2022 0322   K 4.5 02/27/2022 0322   CL 101 02/27/2022 0322   CO2 25 02/27/2022 0322   GLUCOSE 93 02/27/2022 0322   BUN 13 02/27/2022 0322   CREATININE 0.75 02/27/2022 0322   CALCIUM 8.5 (L) 02/27/2022 0322   GFRNONAA >60 02/27/2022 0322     Assessment/Plan: 10 Days Post-Op   Principal Problem:   Osteoarthritis of left knee Active Problems:   Acute metabolic encephalopathy   Acute hyponatremia   HCAP (healthcare-associated pneumonia)   AKI (acute kidney injury) (HCC)   Hypotension   Paroxysmal atrial fibrillation with RVR (HCC)   PMR (polymyalgia rheumatica) (HCC)   DNR (do not resuscitate)   WBAT with walker DVT ppx:  ok to start Eliquis from ortho stand point , SCDs, TEDS PO pain control PT/OT Diarrhea: 2/2 aggressive bowel regimen, will hold meds for now, start loperamide PRN Appreciate TRH assistance Dispo: SNF pending   Bertram Savin 02/27/2022, 10:43 AM   Rod Can, MD [phone] [facility] is now [facility]  [place] [address], [place], [place] [address] Phone: [phone] [email or web address] Facebook   Instagram  LinkedIn  Twitter       ADDENDUM: Eliquis is not an option due to phenobarbital usage. TRH has started coumadin instead. Discussed with Dr. Algis Liming.

## 2022-02-28 DIAGNOSIS — I4891 Unspecified atrial fibrillation: Secondary | ICD-10-CM | POA: Diagnosis not present

## 2022-02-28 DIAGNOSIS — R197 Diarrhea, unspecified: Secondary | ICD-10-CM | POA: Diagnosis not present

## 2022-02-28 DIAGNOSIS — D72829 Elevated white blood cell count, unspecified: Secondary | ICD-10-CM | POA: Diagnosis not present

## 2022-02-28 LAB — CBC
HCT: 34.5 % — ABNORMAL LOW (ref 36.0–46.0)
Hemoglobin: 10.9 g/dL — ABNORMAL LOW (ref 12.0–15.0)
MCH: 30.7 pg (ref 26.0–34.0)
MCHC: 31.6 g/dL (ref 30.0–36.0)
MCV: 97.2 fL (ref 80.0–100.0)
Platelets: 307 10*3/uL (ref 150–400)
RBC: 3.55 MIL/uL — ABNORMAL LOW (ref 3.87–5.11)
RDW: 15.8 % — ABNORMAL HIGH (ref 11.5–15.5)
WBC: 26.1 10*3/uL — ABNORMAL HIGH (ref 4.0–10.5)
nRBC: 0.3 % — ABNORMAL HIGH (ref 0.0–0.2)

## 2022-02-28 LAB — CULTURE, BLOOD (ROUTINE X 2)
Culture: NO GROWTH
Culture: NO GROWTH
Special Requests: ADEQUATE
Special Requests: ADEQUATE

## 2022-02-28 LAB — PROTIME-INR
INR: 1.3 — ABNORMAL HIGH (ref 0.8–1.2)
Prothrombin Time: 15.6 seconds — ABNORMAL HIGH (ref 11.4–15.2)

## 2022-02-28 LAB — BASIC METABOLIC PANEL
Anion gap: 11 (ref 5–15)
BUN: 18 mg/dL (ref 8–23)
CO2: 27 mmol/L (ref 22–32)
Calcium: 8.6 mg/dL — ABNORMAL LOW (ref 8.9–10.3)
Chloride: 98 mmol/L (ref 98–111)
Creatinine, Ser: 0.65 mg/dL (ref 0.44–1.00)
GFR, Estimated: >60 mL/min (ref >60)
Glucose, Bld: 107 mg/dL — ABNORMAL HIGH (ref 70–99)
Potassium: 3.5 mmol/L (ref 3.5–5.1)
Sodium: 136 mmol/L (ref 135–145)

## 2022-02-28 LAB — HEPARIN LEVEL (UNFRACTIONATED): Heparin Unfractionated: 0.36 IU/mL (ref 0.30–0.70)

## 2022-02-28 MED ORDER — WARFARIN SODIUM 6 MG PO TABS
6.0000 mg | ORAL_TABLET | Freq: Once | ORAL | Status: AC
  Administered 2022-02-28: 6 mg via ORAL
  Filled 2022-02-28: qty 1

## 2022-02-28 MED ORDER — POTASSIUM CHLORIDE CRYS ER 20 MEQ PO TBCR
40.0000 meq | EXTENDED_RELEASE_TABLET | Freq: Once | ORAL | Status: AC
  Administered 2022-02-28: 40 meq via ORAL
  Filled 2022-02-28: qty 2

## 2022-02-28 NOTE — Plan of Care (Signed)
  Problem: Education: Goal: Knowledge of General Education information will improve Description: Including pain rating scale, medication(s)/side effects and non-pharmacologic comfort measures Outcome: Progressing   Problem: Nutrition: Goal: Adequate nutrition will be maintained Outcome: Progressing   Problem: Elimination: Goal: Will not experience complications related to bowel motility Outcome: Progressing   

## 2022-02-28 NOTE — Progress Notes (Signed)
PROGRESS NOTE   [name]  YME:[id number]    DOB: 03/20/1930    DOA: 02/17/2022  PCP: [name], MD   I have briefly reviewed patients previous medical records in [facility].   Brief Narrative:  PCCM >TRH for continued medical consult 02/25/2022: 88 year old female underwent left TKA by Dr. [name] on 02/17/2022.  9/16, noted to have AMS with hypersomnolence and labs revealed an acute drop in her sodium to 116, previously 136.  TRH and nephrology were consulted and she was started on 3% normal saline.  On 9/17, she developed A-fib with RVR, cardiology was consulted, started on Cardizem and converted to NSR.  9/18, IV antibiotics initiated for left lower lobe atelectasis versus infiltrate.  9/19, went back into A-fib with RVR, started on amiodarone bolus and infusion, developed transient shock that resolved after fluid bolus, briefly required low-dose norepinephrine and steroids by PCCM.  She was started on heparin infusion for PE prophylaxis.  DVT negative on ultrasound.  Cannot use DOAC's because of being on phenobarbital and which reduces DOAC's efficacy.  Thereby after discussing with patient/family, initiated warfarin with heparin bridging.  Laxative related diarrhea.  DC to SNF pending therapeutic INR.   Assessment & Plan:  Principal Problem:   Osteoarthritis of left knee Active Problems:   Acute metabolic encephalopathy   Acute hyponatremia   HCAP (healthcare-associated pneumonia)   AKI (acute kidney injury) ([place])   Hypotension   Paroxysmal atrial fibrillation with RVR (HCC)   PMR (polymyalgia rheumatica) (HCC)   DNR (do not resuscitate)   Hypotension - resolved Likely result of Amiodarone infusion d/t the timing.  Potential HCAP contributing.  Briefly required pressors in ICU, has been off for several days.  Resolved.  Midodrine quickly tapered and discontinued.  Tolerating oral amiodarone.  Weaning steroids back to her chronic home dose.  Paroxysmal  Atrial Fibrillation Hx HTN, MVR, HTN, HLD Initially given Cardizem but converted and later started on amiodarone, subsequently developed hypotension.   -goal K+>4, Mg+>2  -appreciate Cardiology.  Continue oral amiodarone and IV heparin drip. -continue heparin infusion bridging until INR therapeutic on warfarin-started 9/23. -Orthopedics has cleared to start oral anticoagulation. - Cannot use DOAC's because of being on phenobarbital long-term, and which reduces DOAC's efficacy.  Thereby initiated warfarin with heparin bridging. -Eventually as outpatient, can follow-up with Neurology and if neurology is able to switch her from phenobarbital to an alternate agent without interactions with DOAC's, then she can be switched from warfarin to DOAC's.  INR 1.3.  May take several days for it to become therapeutic.  Replaced K.  Remains in sinus rhythm.   HCAP vs Atelectasis Acute Hypoxic Respiratory Failure CXR c/f LLL infiltrate vs atelectasis.  Newly required O2 via [place], since off and on RA.  DVT r/o. CTA initially not feasible d/t AKI to r/o PE.  Started on heparin infusion for Afib.  Started 9/23. -suspect hypoxia driven by atelectasis, post surgical.  Less likely PE -D5/x abx.  Encourage incentive spirometry.  Mobilize.  Due to diarrhea, leukocytosis and has already completed almost 5 days course, cefepime discontinued 9/23. -Communicated with Dr. Verlee Monte, PCCM on 9/21 and if she is going to be on anticoagulation anyway for A-fib then agrees that we can defer CTA chest.  Also low index of suspicion for PE.    Acute diarrhea: Started after aggressive bowel management for constipation.  Discontinued all laxatives.  Got a single dose of Imodium when diarrhea has resolved.  She has no other  GI symptoms.  However leukocytosis still increasing and concerning.?  Related to steroids and will further taper tomorrow and continue to trend daily CBCs.   AKI Hyponatremia Resolved, no longer on 3% NS as of  9/18 -appreciate Nephrology, signed off.  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  They felt AKI was related to bladder retention, bladder scan showed 900 mL and Foley catheter was placed. -Has great urine output.  Discontinued IV fluids. -Replace electrolytes as indicated -Avoid nephrotoxic agents, ensure adequate renal perfusion  Hypokalemia: Continue to replace as needed and follow.  Hypophosphatemia: Replaced.  Leukocytosis: Likely multifactorial related to infectious etiology, steroids and stress.  Upon chart review, has chronic mild leukocytosis in the 11-12 range.  Despite no clear infectious etiology by symptoms or signs, leukocytosis slowly increasing.  We will further decrease prednisone dose to her baseline of 5 mg daily.  Follow CBC.  Normocytic anemia: Stable   Acute Urinary Retention  -Foley catheter discontinued 9/21.  Had recurrent urinary retention symptoms yesterday, did not do well with pure wick, able to void 1100 on the commode.  Mobilize and avoid Foley catheter.   Acute Metabolic Encephalopathy Likely related to hypotension, hyponatremia > as BP improved so did mental status.   -supportive care -limit sedating medications  -delirium precautions  -Resolved.   Hx Seizures -continue home phenobarbital.  As per patient and daughters at bedside, has been on this for 50+ years.  Last seizure in 1987.  Please see discussion above regarding outpatient neurology consultation and management of AEDs.   Post op L TKA -post op mgmt per Ortho  -reduce pain regimen > limited use, encephalopathy risk  -Discussed with Dr. Lyla Glassing 9/23: DC to be delayed pending INR therapeutic.  Osteoarthritis/rheumatoid arthritis Patient on chronic prednisone 5 Mg daily.  Weaning off steroids to prior home dose.    Body mass  index is 30.08 kg/m.    DVT prophylaxis: SCDs Start: 02/17/22 1101     Code Status: DNR:  Family Communication: None at bedside. Disposition:  Status is: Inpatient DC to SNF pending improvement in diarrhea and therapeutic INR.     Consultants:   PCCM Cardiology Nephrology  Procedures:   As noted above  Antimicrobials:   IV cefepime 9/19 > 9/23 IV vancomycin x1 and metronidazole x1 month 9/19.   Subjective:  Difficulty and disturbed sleep last night.  Diarrhea resolved and has had no BM since yesterday morning.  Minimal dry cough.  States that her left knee "woke up" this morning and had pain which improved with pain meds.  Objective:   Vitals:   02/27/22 1349 02/27/22 2145 02/28/22 0500 02/28/22 0533  BP: 129/70 117/71  (!) 144/59  Pulse: (!) 101 (!) 110  86  Resp: '19 14  14  '$ Temp: 97.9 F (36.6 C) 98.1 F (36.7 C)  (!) 97.5 F (36.4 C)  TempSrc: Oral Oral  Oral  SpO2: 96% 96%  (!) 88%  Weight:   72.2 kg   Height:        General exam: Elderly female, moderately built and nourished lying comfortably supine in bed without distress.  Oral mucosa moist. Respiratory system: Clear to auscultation.  No increased work of breathing. Cardiovascular system: S1 & S2 heard, RRR. No JVD, murmurs, rubs, gallops or clicks.  RLE without edema.  Trace left leg edema (postop).  Telemetry personally reviewed:  Sinus rhythm with PACs.  Occasional mild sinus tachycardia. Gastrointestinal system: Abdomen is nondistended, soft and nontender. No organomegaly or masses felt. Normal bowel sounds heard. Central nervous system: Alert and oriented. No focal neurological deficits. Extremities: Symmetric 5 x 5 power.  Left knee postop splint has been removed.  Surgical site without acute findings.  Left knee and lower leg with extensive bruising from surgery.  Left forearm bruising without overt bleeding. Skin: No rashes, lesions or ulcers Psychiatry: Judgement and insight appear normal. Mood &  affect appropriate.     Data Reviewed:   I have personally reviewed following labs and imaging studies   CBC: Recent Labs  Lab 02/26/22 0247 02/27/22 0322 02/28/22 0327  WBC 23.0* 24.7* 26.1*  HGB 9.3* 10.3* 10.9*  HCT 28.3* 33.3* 34.5*  MCV 94.3 98.2 97.2  PLT 281 276 673    Basic Metabolic Panel: Recent Labs  Lab 02/23/22 0339 02/24/22 0316 02/25/22 0329 02/25/22 1950 02/26/22 0247 02/27/22 0322 02/28/22 0327  NA 131* 137 141  --  137 138 136  K 4.3 3.4* 2.8* 3.6 3.3* 4.5 3.5  CL 101 107 109  --  103 101 98  CO2 19* 21* 24  --  '26 25 27  '$ GLUCOSE 119* 145* 103*  --  103* 93 107*  BUN 40* 33* 19  --  '11 13 18  '$ CREATININE 1.91* 1.06* 0.62  --  0.57 0.75 0.65  CALCIUM 8.4* 8.2* 7.9*  --  7.8* 8.5* 8.6*  MG 2.2 2.4 2.1  --   --   --   --   PHOS  --  3.0 1.5* 2.8 2.4* 3.3  --     Liver Function Tests: Recent Labs  Lab 02/23/22 0339 02/24/22 0316 02/25/22 0329 02/26/22 0247 02/27/22 0322  AST 26 22  --   --   --   ALT 22 24  --   --   --   ALKPHOS 73 81  --   --   --   BILITOT 1.6* 1.2  --   --   --   PROT 5.6* 5.4*  --   --   --   ALBUMIN 2.7* 2.6* 2.5* 2.6* 2.9*    CBG: No results for input(s): "GLUCAP" in the last 168 hours.   Microbiology Studies:   Recent Results (from the past 240 hour(s))  Culture, blood (Routine X 2) w Reflex to ID Panel     Status: None   Collection Time: 02/20/22  7:55 PM   Specimen: BLOOD  Result Value Ref Range Status   Specimen Description   Final    BLOOD LEFT ANTECUBITAL Performed at [facility] [address], [place], [place] [address]    Special Requests   Final    BOTTLES DRAWN AEROBIC AND ANAEROBIC Blood Culture adequate volume Performed at [facility] [address], [place], [place] [address]    Culture   Final    NO GROWTH 5 DAYS Performed at [facility], [place] [address], [place], [place] [address]    Report Status 02/26/2022 FINAL  Final  Culture,  blood (Routine X 2) w Reflex to ID Panel     Status: None   Collection Time: 02/20/22  8:07 PM   Specimen: BLOOD  Result Value Ref Range Status   Specimen Description   Final    BLOOD BLOOD RIGHT HAND Performed at [facility] [address], [place], [place] [address]  Special Requests   Final    BOTTLES DRAWN AEROBIC ONLY Blood Culture results may not be optimal due to an inadequate volume of blood received in culture bottles Performed at [facility] [address] [place], [place] [address]    Culture   Final    NO GROWTH 5 DAYS Performed at [facility], [place] [address], [place], [place] [address]    Report Status 02/26/2022 FINAL  Final  Culture, blood (Routine X 2) w Reflex to ID Panel     Status: None   Collection Time: 02/23/22  2:10 PM   Specimen: BLOOD  Result Value Ref Range Status   Specimen Description   Final    BLOOD SITE NOT SPECIFIED Performed at [facility] [address], [place], [place] [address]    Special Requests   Final    BOTTLES DRAWN AEROBIC ONLY Blood Culture adequate volume Performed at [facility] [address] [place], [place] [address]    Culture   Final    NO GROWTH 5 DAYS Performed at [facility], [place] [address], [place], [place] [address]    Report Status 02/28/2022 FINAL  Final  Culture, blood (Routine X 2) w Reflex to ID Panel     Status: None   Collection Time: 02/23/22  2:10 PM   Specimen: BLOOD LEFT HAND  Result Value Ref Range Status   Specimen Description BLOOD LEFT HAND  Final   Special Requests   Final    BOTTLES DRAWN AEROBIC ONLY Blood Culture adequate volume   Culture   Final    NO GROWTH 5 DAYS Performed at [facility], [place] [address], [place], [place] [address]    Report Status 02/28/2022 FINAL  Final  Urine Culture     Status: None   Collection Time: 02/23/22  3:05 PM   Specimen: Urine, Catheterized  Result Value  Ref Range Status   Specimen Description   Final    URINE, CATHETERIZED Performed at [facility] [address], [place], [place] [address]    Special Requests   Final    NONE Performed at [facility], [place] [address], [place], [place] [address]    Culture   Final    NO GROWTH Performed at [facility], [place] [address] [place], [place] [address]    Report Status 02/27/2022 FINAL  Final  MRSA Next Gen by PCR, Nasal     Status: None   Collection Time: 02/23/22  5:49 PM   Specimen: Nasal Mucosa; Nasal Swab  Result Value Ref Range Status   MRSA by PCR Next Gen NOT DETECTED NOT DETECTED Final    Comment: (NOTE) The GeneXpert MRSA Assay (FDA approved for NASAL specimens only), is one component of a comprehensive MRSA colonization surveillance program. It is not intended to diagnose MRSA infection nor to guide or monitor treatment for MRSA infections. Test performance is not FDA approved in patients less than 39 years old. Performed at [facility], [place] [address], [place], [place] [address]     Radiology Studies:  No results found.  Scheduled Meds:    amiodarone  400 mg Oral BID   Chlorhexidine Gluconate Cloth  6 each Topical Daily   ezetimibe  10 mg Oral Daily   feeding supplement  237 mL Oral BID BM   furosemide  40 mg Oral Daily   metoprolol tartrate  12.5 mg  Oral BID   pantoprazole  40 mg Oral Daily   PHENobarbital  97.2 mg Oral QHS   predniSONE  10 mg Oral Q breakfast   warfarin  6 mg Oral ONCE-1600   Warfarin - Pharmacist Dosing Inpatient   Does not apply q1600    Continuous Infusions:    sodium chloride     heparin 1,050 Units/hr (02/28/22 0600)   methocarbamol (ROBAXIN) IV       LOS: 7 days     Vernell Leep, MD,  Vernell Leep, MD,  FACP, [facility], [facility], [facility], [place]   To contact the attending provider between 7A-7P or the covering  provider during after hours 7P-7A, please log into the web site [email or web address] and access using universal [place] password for that web site. If you do not have the password, please call the hospital operator.  02/28/2022, 8:40 AM

## 2022-02-28 NOTE — Plan of Care (Signed)

## 2022-02-28 NOTE — Progress Notes (Signed)
ANTICOAGULATION CONSULT NOTE - Follow Up Consult  Pharmacy Consult for Heparin Indication: atrial fibrillation  Allergies  Allergen Reactions   Sulfa Antibiotics Nausea Only   Penicillins Swelling    Tolerated Cephalosporin 01/04/20      Simvastatin Swelling    Patient Measurements: Height: '5\' 1"'$  (154.9 cm) Weight: 72.2 kg (159 lb 2.8 oz) IBW/kg (Calculated) : 47.8  Vital Signs: Temp: 98.1 F (36.7 C) (09/23 2145) Temp Source: Oral (09/23 2145) BP: 117/71 (09/23 2145) Pulse Rate: 110 (09/23 2145)  Labs: Recent Labs    02/26/22 0247 02/27/22 0322 02/28/22 0327  HGB 9.3* 10.3* 10.9*  HCT 28.3* 33.3* 34.5*  PLT 281 276 307  LABPROT  --   --  15.6*  INR  --   --  1.3*  HEPARINUNFRC 0.46 0.36 0.36  CREATININE 0.57 0.75 0.65     Estimated Creatinine Clearance: 42.5 mL/min (by C-G formula based on SCr of 0.65 mg/dL).   Assessment:  AC/Heme:  ABLA post op> ortho rec 2 U PRBC 9/17 (hgb 7.9) d/t lethargy. Post-op (TKR)  Afib with CHADS2VASC 4. - Hep level 0.36 in goal. Hgb 10.3 up, Plts WNL. Start  warfarin due to Drug-drug interaction with phenobarb (enzyme inducer).   02/28/2022 HL 0.36 therapeutic on 1050 units/hr INR 1.3 Hgb up to 10.9, plts WNL No bleeding reported  Goal of Therapy:  Heparin level 0.3-0.7 units/ml INR 2-3 Monitor platelets by anticoagulation protocol: Yes   Plan:  Continue Heparin infusion at 1050 units/hr (16 units/kg/hr) with post-op anemia Warfarin '6mg'$  po x 1 today Daily HL and CBC and INR   Dolly Rias RPh 02/28/2022, 4:41 AM

## 2022-02-28 NOTE — Progress Notes (Signed)
   Subjective: 11 Days Post-Op Procedure(s) (LRB): TOTAL KNEE ARTHROPLASTY (Left) Patient reports pain as mild.   Patient seen in rounds for Dr. [name]. Patient is resting in bed on exam this morning. No acute events overnight. She understands that she will likely be here until next week, and tells me they are looking for SNF beds for her.  We will continue therapy today.   Objective: Vital signs in last 24 hours: Temp:  [97.5 F (36.4 C)-98.1 F (36.7 C)] 97.5 F (36.4 C) (09/24 0533) Pulse Rate:  [86-110] 86 (09/24 0533) Resp:  [14-19] 14 (09/24 0533) BP: (117-144)/(59-71) 144/59 (09/24 0533) SpO2:  [88 %-96 %] 88 % (09/24 0533) Weight:  [72.2 kg] 72.2 kg (09/24 0500)  Intake/Output from previous day:  Intake/Output Summary (Last 24 hours) at 02/28/2022 0737 Last data filed at 02/28/2022 0600 Gross per 24 hour  Intake 1408.18 ml  Output 1750 ml  Net -341.82 ml     Intake/Output this shift: No intake/output data recorded.  Labs: Recent Labs    02/26/22 0247 02/27/22 0322 02/28/22 0327  HGB 9.3* 10.3* 10.9*   Recent Labs    02/27/22 0322 02/28/22 0327  WBC 24.7* 26.1*  RBC 3.39* 3.55*  HCT 33.3* 34.5*  PLT 276 307   Recent Labs    02/27/22 0322 02/28/22 0327  NA 138 136  K 4.5 3.5  CL 101 98  CO2 25 27  BUN 13 18  CREATININE 0.75 0.65  GLUCOSE 93 107*  CALCIUM 8.5* 8.6*   Recent Labs    02/28/22 0327  INR 1.3*    Exam: General - Patient is Alert and Oriented Extremity - Neurologically intact Sensation intact distally Intact pulses distally Dorsiflexion/Plantar flexion intact Dressing - dressing C/D/I Motor Function - intact, moving foot and toes well on exam.   Past Medical History:  Diagnosis Date   Anemia    Anxiety    Arthritis    Atrial fibrillation (HCC)    Cancer (HCC)    breast Ca. on 2013   Chronic back pain    Depression    Epilepsy ([place])    GERD (gastroesophageal reflux disease)    History of kidney stones     Hyperlipidemia    Hypertension    Macular degeneration    Mitral regurgitation    Pneumonia    Polymyalgia rheumatica (HCC)    Rheumatoid arthritis (HCC)    Seizures (HCC)     Assessment/Plan: 11 Days Post-Op Procedure(s) (LRB): TOTAL KNEE ARTHROPLASTY (Left) Principal Problem:   Osteoarthritis of left knee Active Problems:   Acute metabolic encephalopathy   Acute hyponatremia   HCAP (healthcare-associated pneumonia)   AKI (acute kidney injury) ([place])   Hypotension   Paroxysmal atrial fibrillation with RVR (HCC)   PMR (polymyalgia rheumatica) (HCC)   DNR (do not resuscitate)  Estimated body mass index is 30.08 kg/m as calculated from the following:   Height as of this encounter: '5\' 1"'$  (1.549 m).   Weight as of this encounter: 72.2 kg. Advance diet      WBAT with walker DVT ppx:  Coumadin , SCDs, TEDS - INR 1.3 today PO pain control PT/OT Diarrhea: 2/2 aggressive bowel regimen, will hold meds for now, start loperamide PRN Appreciate TRH assistance Dispo: SNF pending      Griffith Citron, PA-C Orthopedic Surgery [phone] 02/28/2022, 7:37 AM

## 2022-03-01 DIAGNOSIS — R197 Diarrhea, unspecified: Secondary | ICD-10-CM | POA: Diagnosis not present

## 2022-03-01 DIAGNOSIS — D72829 Elevated white blood cell count, unspecified: Secondary | ICD-10-CM | POA: Diagnosis not present

## 2022-03-01 DIAGNOSIS — I4891 Unspecified atrial fibrillation: Secondary | ICD-10-CM | POA: Diagnosis not present

## 2022-03-01 LAB — CBC WITH DIFFERENTIAL/PLATELET
Abs Immature Granulocytes: 1.21 10*3/uL — ABNORMAL HIGH (ref 0.00–0.07)
Basophils Absolute: 0.1 10*3/uL (ref 0.0–0.1)
Basophils Relative: 1 %
Eosinophils Absolute: 0.4 10*3/uL (ref 0.0–0.5)
Eosinophils Relative: 2 %
HCT: 32.3 % — ABNORMAL LOW (ref 36.0–46.0)
Hemoglobin: 10.4 g/dL — ABNORMAL LOW (ref 12.0–15.0)
Immature Granulocytes: 5 %
Lymphocytes Relative: 41 %
Lymphs Abs: 9.7 10*3/uL — ABNORMAL HIGH (ref 0.7–4.0)
MCH: 31 pg (ref 26.0–34.0)
MCHC: 32.2 g/dL (ref 30.0–36.0)
MCV: 96.4 fL (ref 80.0–100.0)
Monocytes Absolute: 1.2 10*3/uL — ABNORMAL HIGH (ref 0.1–1.0)
Monocytes Relative: 5 %
Neutro Abs: 11 10*3/uL — ABNORMAL HIGH (ref 1.7–7.7)
Neutrophils Relative %: 46 %
Platelets: 301 10*3/uL (ref 150–400)
RBC: 3.35 MIL/uL — ABNORMAL LOW (ref 3.87–5.11)
RDW: 15.9 % — ABNORMAL HIGH (ref 11.5–15.5)
WBC: 23.7 10*3/uL — ABNORMAL HIGH (ref 4.0–10.5)
nRBC: 0.2 % (ref 0.0–0.2)

## 2022-03-01 LAB — PROTIME-INR
INR: 1.3 — ABNORMAL HIGH (ref 0.8–1.2)
Prothrombin Time: 15.8 seconds — ABNORMAL HIGH (ref 11.4–15.2)

## 2022-03-01 LAB — RENAL FUNCTION PANEL
Albumin: 2.9 g/dL — ABNORMAL LOW (ref 3.5–5.0)
Anion gap: 8 (ref 5–15)
BUN: 19 mg/dL (ref 8–23)
CO2: 29 mmol/L (ref 22–32)
Calcium: 8.4 mg/dL — ABNORMAL LOW (ref 8.9–10.3)
Chloride: 99 mmol/L (ref 98–111)
Creatinine, Ser: 0.51 mg/dL (ref 0.44–1.00)
GFR, Estimated: >60 mL/min (ref >60)
Glucose, Bld: 95 mg/dL (ref 70–99)
Phosphorus: 2.4 mg/dL — ABNORMAL LOW (ref 2.5–4.6)
Potassium: 3.5 mmol/L (ref 3.5–5.1)
Sodium: 136 mmol/L (ref 135–145)

## 2022-03-01 LAB — MAGNESIUM: Magnesium: 1.8 mg/dL (ref 1.7–2.4)

## 2022-03-01 LAB — HEPARIN LEVEL (UNFRACTIONATED): Heparin Unfractionated: 0.6 IU/mL (ref 0.30–0.70)

## 2022-03-01 MED ORDER — TAMSULOSIN HCL 0.4 MG PO CAPS
0.4000 mg | ORAL_CAPSULE | Freq: Every day | ORAL | Status: DC
  Administered 2022-03-01 – 2022-03-03 (×3): 0.4 mg via ORAL
  Filled 2022-03-01 (×3): qty 1

## 2022-03-01 MED ORDER — OXYCODONE HCL 5 MG PO TABS: 5.0000 mg | ORAL_TABLET | ORAL | 0 refills | Status: AC | PRN

## 2022-03-01 MED ORDER — TRAMADOL HCL 50 MG PO TABS: 50.0000 mg | ORAL_TABLET | Freq: Four times a day (QID) | ORAL | 0 refills | Status: AC

## 2022-03-01 MED ORDER — PREDNISONE 5 MG PO TABS
5.0000 mg | ORAL_TABLET | Freq: Every day | ORAL | Status: DC
  Administered 2022-03-01 – 2022-03-03 (×3): 5 mg via ORAL
  Filled 2022-03-01 (×3): qty 1

## 2022-03-01 MED ORDER — MAGNESIUM SULFATE 2 GM/50ML IV SOLN
2.0000 g | Freq: Once | INTRAVENOUS | Status: AC
  Administered 2022-03-01: 2 g via INTRAVENOUS
  Filled 2022-03-01: qty 50

## 2022-03-01 MED ORDER — K PHOS MONO-SOD PHOS DI & MONO 155-852-130 MG PO TABS
500.0000 mg | ORAL_TABLET | Freq: Two times a day (BID) | ORAL | Status: AC
  Administered 2022-03-01 (×2): 500 mg via ORAL
  Filled 2022-03-01 (×2): qty 2

## 2022-03-01 MED ORDER — WARFARIN SODIUM 5 MG PO TABS
7.5000 mg | ORAL_TABLET | Freq: Once | ORAL | Status: AC
  Administered 2022-03-01: 7.5 mg via ORAL
  Filled 2022-03-01: qty 1

## 2022-03-01 MED ORDER — POTASSIUM CHLORIDE CRYS ER 20 MEQ PO TBCR
40.0000 meq | EXTENDED_RELEASE_TABLET | Freq: Every day | ORAL | Status: DC
  Administered 2022-03-01 – 2022-03-03 (×3): 40 meq via ORAL
  Filled 2022-03-01 (×3): qty 2

## 2022-03-01 NOTE — Progress Notes (Signed)
ANTICOAGULATION CONSULT NOTE  Pharmacy Consult for heparin >> warfarin Indication: atrial fibrillation  Allergies  Allergen Reactions   Sulfa Antibiotics Nausea Only   Penicillins Swelling    Tolerated Cephalosporin 01/04/20      Simvastatin Swelling    Patient Measurements: Height: '5\' 1"'$  (154.9 cm) Weight: 72.2 kg (159 lb 2.8 oz) IBW/kg (Calculated) : 47.8 Heparin Dosing Weight: 63.2 kg  Vital Signs: Temp: 98 F (36.7 C) (09/25 0529) BP: 140/70 (09/25 0529) Pulse Rate: 75 (09/25 0529)  Labs: Recent Labs    02/27/22 0322 02/28/22 0327 03/01/22 0334  HGB 10.3* 10.9* 10.4*  HCT 33.3* 34.5* 32.3*  PLT 276 307 301  LABPROT  --  15.6* 15.8*  INR  --  1.3* 1.3*  HEPARINUNFRC 0.36 0.36 0.60  CREATININE 0.75 0.65 0.51    Estimated Creatinine Clearance: 42.5 mL/min (by C-G formula based on SCr of 0.51 mg/dL).   Assessment: 69 yoF with PMH PAF not on anticoag PTA, who experienced AFib/RVR after TKA. She was started on heparin, now bridging to warfarin (more reliable monitoring) due to interaction between her phenobarbital (enzyme inducer) and DOACs.  Today, 03/01/22: - HL 0.60, therapeutic on 1050 units/hr heparin gtt - INR 1.3, warfarin 6 mg doses given 9/23 and 9/24 - Hgb 10.4, low but stable - Plts WNL - Regular diet ordered; meal intake increased to 100% of recent meals per chart - Phenobarbital may reduce warfarin effect - No bleeding or line issues per RN  Goal of Therapy:  INR 2-3 Heparin level 0.3-0.7 units/ml Monitor platelets by anticoagulation protocol: Yes   Plan:  - Continue heparin gtt at 1050 units/hr - Warfarin 7.5 mg PO x 1 dose this evening - Daily HL, CBC, and INR - Monitor for signs and symptoms of bleeding  Cannon Kettle, PharmD Candidate 03/01/2022 9:52 AM

## 2022-03-01 NOTE — Progress Notes (Signed)
PROGRESS NOTE   [name]  YNW:[id number]    DOB: 05-14-1930    DOA: 02/17/2022  PCP: [name], MD   I have briefly reviewed patients previous medical records in [facility].   Brief Narrative:  PCCM >TRH for continued medical consult 02/25/2022: 88 year old female underwent left TKA by Dr. [name] on 02/17/2022.  9/16, noted to have AMS with hypersomnolence and labs revealed an acute drop in her sodium to 116, previously 136.  TRH and nephrology were consulted and she was started on 3% normal saline.  On 9/17, she developed A-fib with RVR, cardiology was consulted, started on Cardizem and converted to NSR.  9/18, IV antibiotics initiated for left lower lobe atelectasis versus infiltrate.  9/19, went back into A-fib with RVR, started on amiodarone bolus and infusion, developed transient shock that resolved after fluid bolus, briefly required low-dose norepinephrine and steroids by PCCM.  She was started on heparin infusion for PE prophylaxis.  DVT negative on ultrasound.  Cannot use DOAC's because of being on phenobarbital and which reduces DOAC's efficacy.  Thereby after discussing with patient/family, initiated warfarin with heparin bridging.  Laxative related diarrhea.  DC to SNF pending therapeutic INR.   Assessment & Plan:  Principal Problem:   Osteoarthritis of left knee Active Problems:   Acute metabolic encephalopathy   Acute hyponatremia   HCAP (healthcare-associated pneumonia)   AKI (acute kidney injury) ([place])   Hypotension   Paroxysmal atrial fibrillation with RVR (HCC)   PMR (polymyalgia rheumatica) (HCC)   DNR (do not resuscitate)   Hypotension - resolved Likely result of Amiodarone infusion d/t the timing.  Potential HCAP contributing.  Briefly required pressors in ICU, has been off for several days.  Resolved.  Midodrine quickly tapered and discontinued.  Tolerating oral amiodarone.  Has been weaned back to home dose of prednisone 5 Mg  daily.  Paroxysmal Atrial Fibrillation Hx HTN, MVR, HTN, HLD Initially given Cardizem but converted and later started on amiodarone, subsequently developed hypotension.   -goal K+>4, Mg+>2  -appreciate Cardiology.  Continue oral amiodarone and IV heparin drip. -continue heparin infusion bridging until INR therapeutic on warfarin-started 9/23.  INR still subtherapeutic/1.3, may take a couple more days to become therapeutic. -Orthopedics has cleared to start oral anticoagulation. - Cannot use DOAC's because of being on phenobarbital long-term, and which reduces DOAC's efficacy.  Thereby initiated warfarin with heparin bridging. -Eventually as outpatient, can follow-up with Neurology and if neurology is able to switch her from phenobarbital to an alternate agent without interactions with DOAC's, then she can be switched from warfarin to DOAC's.  INR 1.3.  May take several days for it to become therapeutic.  Replaced K.  Remains in sinus rhythm.   HCAP vs Atelectasis Acute Hypoxic Respiratory Failure CXR c/f LLL infiltrate vs atelectasis.  Newly required O2 via [place], since off and on RA.  DVT r/o. CTA initially not feasible d/t AKI to r/o PE.  Started on heparin infusion for Afib.  Started 9/23. -suspect hypoxia driven by atelectasis, post surgical.  Less likely PE -D5/x abx.  Encourage incentive spirometry.  Mobilize.  Due to diarrhea, leukocytosis and has already completed almost 5 days course, cefepime discontinued 9/23. -Communicated with Dr. Verlee Monte, PCCM on 9/21 and if she is going to be on anticoagulation anyway for A-fib then agrees that we can defer CTA chest.  Also low index of suspicion for PE.    Acute diarrhea: Secondary to aggressive bowel regimen.  Resolved.  No BM  for 48 hours.   AKI Hyponatremia Resolved, no longer on 3% NS as of 9/18 -appreciate Nephrology, signed off.  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  They felt AKI was related to bladder retention, bladder scan showed 900 mL and Foley catheter was placed. -Has great urine output.  Discontinued IV fluids. -Replace electrolytes as indicated -Avoid nephrotoxic agents, ensure adequate renal perfusion  Hypokalemia: Continue to replace and follow.  Replace magnesium as needed.  Hypophosphatemia: Continue to replace and follow  Leukocytosis: Likely multifactorial related to infectious etiology, steroids and stress.  Upon chart review, has chronic mild leukocytosis in the 11-12 range.  Despite no clear infectious etiology by symptoms or signs, leukocytosis slowly increasing.  We will further decrease prednisone dose to her baseline of 5 mg daily.  Slightly lower today than yesterday.  Continue to trend daily CBCs.  Normocytic anemia: Stable   Acute Urinary Retention  -Foley catheter discontinued 9/21.  However now having ongoing recurrent acute urinary retention, required in and out Foley catheter on 9/24.  Added Flomax.   Acute Metabolic Encephalopathy Likely related to hypotension, hyponatremia > as BP improved so did mental status.   -supportive care -limit sedating medications  -delirium precautions  -Resolved.   Hx Seizures -continue home phenobarbital.  As per patient and daughters at bedside, has been on this for 50+ years.  Last seizure in 1987.  Please see discussion above regarding outpatient neurology consultation and management of AEDs.   Post op L TKA -post op mgmt per Ortho  -reduce pain regimen > limited use, encephalopathy risk  -Discussed with Dr. Lyla Glassing 9/23: DC to be delayed pending INR therapeutic.  Osteoarthritis/rheumatoid arthritis Patient on chronic prednisone 5 Mg daily, continue  Body mass index is 30.08 kg/m.    DVT prophylaxis: SCDs Start: 02/17/22 1101     Code Status: DNR:  Family Communication: Twin  daughters at bedside Disposition:  Status is: Inpatient DC to SNF pending improvement in diarrhea and therapeutic INR.  PT to continue to follow in the hospital regularly, daily, needs to mobilize, ambulate/up to chair.     Consultants:   PCCM Cardiology Nephrology  Procedures:   As noted above  Antimicrobials:   IV cefepime 9/19 > 9/23 IV vancomycin x1 and metronidazole x1 month 9/19.   Subjective:  No BM x48 hours.  Passing flatus.  Intermittent sleep at night.  Tylenol helps with sleep.  Ongoing issues with incomplete bladder emptying.  Objective:   Vitals:   02/28/22 1317 02/28/22 2055 03/01/22 0500 03/01/22 0529  BP: (!) 128/54 138/66  (!) 140/70  Pulse: 69 91  75  Resp: '18 20  16  '$ Temp: 98.1 F (36.7 C) 98.7 F (37.1 C)  98 F (36.7 C)  TempSrc: Oral Oral    SpO2: 95% 95%  96%  Weight:   72.2 kg   Height:        General exam: Elderly female, moderately built and nourished lying comfortably supine in bed without distress.  Oral mucosa moist. Respiratory system: Clear to auscultation.  No increased work of breathing. Cardiovascular system: S1 & S2 heard, RRR. No JVD, murmurs, rubs, gallops or clicks.  RLE without edema.  Trace left leg edema (postop).  Telemetry personally reviewed: Sinus rhythm with PACs.  Occasional mild sinus tachycardia. Gastrointestinal system: Abdomen is nondistended, soft and nontender.  No bladder obviously  palpable. Normal bowel sounds heard. Central nervous system: Alert and oriented. No focal neurological deficits. Extremities: Symmetric 5 x 5 power.  Left knee postop splint has been removed.  Surgical site without acute findings.  Left knee and lower leg with extensive bruising from surgery.  Left forearm bruising without overt bleeding. Skin: No rashes, lesions or ulcers Psychiatry: Judgement and insight appear normal. Mood & affect appropriate.     Data Reviewed:   I have personally reviewed following labs and imaging  studies   CBC: Recent Labs  Lab 02/27/22 0322 02/28/22 0327 03/01/22 0334  WBC 24.7* 26.1* 23.7*  NEUTROABS  --   --  11.0*  HGB 10.3* 10.9* 10.4*  HCT 33.3* 34.5* 32.3*  MCV 98.2 97.2 96.4  PLT 276 307 016    Basic Metabolic Panel: Recent Labs  Lab 02/23/22 0339 02/23/22 0339 02/24/22 0316 02/25/22 0329 02/25/22 1950 02/26/22 0247 02/27/22 0322 02/28/22 0327 03/01/22 0334  NA 131*  --  137 141  --  137 138 136 136  K 4.3  --  3.4* 2.8* 3.6 3.3* 4.5 3.5 3.5  CL 101  --  107 109  --  103 101 98 99  CO2 19*  --  21* 24  --  '26 25 27 29  '$ GLUCOSE 119*  --  145* 103*  --  103* 93 107* 95  BUN 40*  --  33* 19  --  '11 13 18 19  '$ CREATININE 1.91*  --  1.06* 0.62  --  0.57 0.75 0.65 0.51  CALCIUM 8.4*  --  8.2* 7.9*  --  7.8* 8.5* 8.6* 8.4*  MG 2.2  --  2.4 2.1  --   --   --   --  1.8  PHOS  --    < > 3.0 1.5* 2.8 2.4* 3.3  --  2.4*   < > = values in this interval not displayed.    Liver Function Tests: Recent Labs  Lab 02/23/22 0339 02/24/22 0316 02/25/22 0329 02/26/22 0247 02/27/22 0322 03/01/22 0334  AST 26 22  --   --   --   --   ALT 22 24  --   --   --   --   ALKPHOS 73 81  --   --   --   --   BILITOT 1.6* 1.2  --   --   --   --   PROT 5.6* 5.4*  --   --   --   --   ALBUMIN 2.7* 2.6* 2.5* 2.6* 2.9* 2.9*    CBG: No results for input(s): "GLUCAP" in the last 168 hours.   Microbiology Studies:   Recent Results (from the past 240 hour(s))  Culture, blood (Routine X 2) w Reflex to ID Panel     Status: None   Collection Time: 02/20/22  7:55 PM   Specimen: BLOOD  Result Value Ref Range Status   Specimen Description   Final    BLOOD LEFT ANTECUBITAL Performed at [facility] [address], [place], [place] [address]    Special Requests   Final    BOTTLES DRAWN AEROBIC AND ANAEROBIC Blood Culture adequate volume Performed at [facility] [address] [place] [employer] [place], [place] [address]    Culture   Final    NO GROWTH  5 DAYS Performed at [facility], [place] [address] [place], [place] [address]    Report Status 02/26/2022 FINAL  Final  Culture,  blood (Routine X 2) w Reflex to ID Panel     Status: None   Collection Time: 02/20/22  8:07 PM   Specimen: BLOOD  Result Value Ref Range Status   Specimen Description   Final    BLOOD BLOOD RIGHT HAND Performed at [facility] [address], [place], [place] [address]    Special Requests   Final    BOTTLES DRAWN AEROBIC ONLY Blood Culture results may not be optimal due to an inadequate volume of blood received in culture bottles Performed at [facility] [address] [place], [place] [address]    Culture   Final    NO GROWTH 5 DAYS Performed at [facility], [place] [address], [place], [place] [address]    Report Status 02/26/2022 FINAL  Final  Culture, blood (Routine X 2) w Reflex to ID Panel     Status: None   Collection Time: 02/23/22  2:10 PM   Specimen: BLOOD  Result Value Ref Range Status   Specimen Description   Final    BLOOD SITE NOT SPECIFIED Performed at [facility] [address] [place], [place] [address]    Special Requests   Final    BOTTLES DRAWN AEROBIC ONLY Blood Culture adequate volume Performed at [facility] [address] [place], [place] [address]    Culture   Final    NO GROWTH 5 DAYS Performed at [facility], [place] [address], [place], [place] [address]    Report Status 02/28/2022 FINAL  Final  Culture, blood (Routine X 2) w Reflex to ID Panel     Status: None   Collection Time: 02/23/22  2:10 PM   Specimen: BLOOD LEFT HAND  Result Value Ref Range Status   Specimen Description BLOOD LEFT HAND  Final   Special Requests   Final    BOTTLES DRAWN AEROBIC ONLY Blood Culture adequate volume   Culture   Final    NO GROWTH 5 DAYS Performed at [facility], [place] [address], [place], [place] [address]    Report Status  02/28/2022 FINAL  Final  Urine Culture     Status: None   Collection Time: 02/23/22  3:05 PM   Specimen: Urine, Catheterized  Result Value Ref Range Status   Specimen Description   Final    URINE, CATHETERIZED Performed at [facility] [address], [place], [place] [address]    Special Requests   Final    NONE Performed at [facility], [place] [address] [place], [place] [address]    Culture   Final    NO GROWTH Performed at [facility], [place] [address], [place], [place] [address]    Report Status 02/27/2022 FINAL  Final  MRSA Next Gen by PCR, Nasal     Status: None   Collection Time: 02/23/22  5:49 PM   Specimen: Nasal Mucosa; Nasal Swab  Result Value Ref Range Status   MRSA by PCR Next Gen NOT DETECTED NOT DETECTED Final    Comment: (NOTE) The GeneXpert MRSA Assay (FDA approved for NASAL specimens only), is one component of a comprehensive MRSA colonization surveillance program. It is not intended to diagnose MRSA infection nor to guide or monitor treatment for MRSA infections. Test performance is not FDA approved in patients less than 54 years old. Performed at [facility], [place] [address], [place], [place] [address]  Radiology Studies:  No results found.  Scheduled Meds:    amiodarone  400 mg Oral BID   ezetimibe  10 mg Oral Daily   feeding supplement  237 mL Oral BID BM   furosemide  40 mg Oral Daily   metoprolol tartrate  12.5 mg Oral BID   pantoprazole  40 mg Oral Daily   PHENobarbital  97.2 mg Oral QHS   phosphorus  500 mg Oral BID   potassium chloride  40 mEq Oral Daily   predniSONE  5 mg Oral Q breakfast   warfarin  7.5 mg Oral ONCE-1600   Warfarin - Pharmacist Dosing Inpatient   Does not apply q1600    Continuous Infusions:    sodium chloride     heparin 1,050 Units/hr (02/28/22 1613)   methocarbamol (ROBAXIN) IV       LOS: 8 days     Vernell Leep, MD,  Vernell Leep, MD,   FACP, [facility], [facility], [facility], [facility]  [facility] [place]   To contact the attending provider between 7A-7P or the covering provider during after hours 7P-7A, please log into the web site [email or web address] and access using universal [place] password for that web site. If you do not have the password, please call the hospital operator.  03/01/2022, 10:11 AM

## 2022-03-01 NOTE — Care Management Important Message (Signed)
Important Message  Patient Details IM Letter placed in Patients room. Name: [name] MRN: [id number] Date of Birth: November 06, 1929   Medicare Important Message Given:  Yes     Kerin Salen 03/01/2022, 2:30 PM

## 2022-03-01 NOTE — Progress Notes (Signed)
Subjective: 12 Days Post-Op Procedure(s) (LRB): TOTAL KNEE ARTHROPLASTY (Left) Patient reports pain as moderate.  Taking oxycodone to help manage pain Patient able to void on her own this am Walked over the weekend, has not worked with PT yet today  Objective: Vital signs in last 24 hours: Temp:  [98 F (36.7 C)-98.7 F (37.1 C)] 98 F (36.7 C) (09/25 0529) Pulse Rate:  [69-91] 75 (09/25 0529) Resp:  [16-20] 16 (09/25 0529) BP: (128-140)/(54-70) 140/70 (09/25 0529) SpO2:  [95 %-96 %] 96 % (09/25 0529) Weight:  [72.2 kg] 72.2 kg (09/25 0500)  Intake/Output from previous day: 09/24 0701 - 09/25 0700 In: 1089.6 [P.O.:960; I.V.:129.6] Out: 2050 [Urine:2050] Intake/Output this shift: Total I/O In: -  Out: 1300 [Urine:1300]  Recent Labs    02/27/22 0322 02/28/22 0327 03/01/22 0334  HGB 10.3* 10.9* 10.4*   Recent Labs    02/28/22 0327 03/01/22 0334  WBC 26.1* 23.7*  RBC 3.55* 3.35*  HCT 34.5* 32.3*  PLT 307 301   Recent Labs    02/28/22 0327 03/01/22 0334  NA 136 136  K 3.5 3.5  CL 98 99  CO2 27 29  BUN 18 19  CREATININE 0.65 0.51  GLUCOSE 107* 95  CALCIUM 8.6* 8.4*   Recent Labs    02/28/22 0327 03/01/22 0334  INR 1.3* 1.3*    Neurologically intact Neurovascular intact Sensation intact distally Intact pulses distally Dorsiflexion/Plantar flexion intact Incision: scant drainage Compartment soft   Assessment/Plan: 12 Days Post-Op Procedure(s) (LRB): TOTAL KNEE ARTHROPLASTY (Left) Up with therapy, weight bearing as tolerated Discharge to SNF waiting for INR to be WNL From ortho stand point ready to go. Leave dressings on until follow up in the office. Hopefully we can get her d/c tomorrow   Drue Novel, PA-C [facility] with Dr. [name] [phone] 03/01/2022, 1:07 PM

## 2022-03-01 NOTE — Progress Notes (Signed)
Physical Therapy Treatment Patient Details Name: [name] MRN: [id number] DOB: 01-19-1930 Today's Date: 03/01/2022   History of Present Illness Patient is a 88 y.o. female s/p L-TKA on 02/17/22. patient was noted to trasnfer to stepdown unit with crital value for sodium. PMH significant for seizures, HTN, GERD, breast cancer (2013), OA, polymyalgia rheumatica, R-TKA 2021, afib, HLD, RA, Required pressors and heparin for possible PE.    PT Comments    POD # 12 General Comments: AxO x 3 feeling "much better" every day. Assisted OOB to [facility] for extended time to attempt voiding which pt is having issues with.  Assisted with amb in hallway.  General Gait Details: tolerated an increased distance of 18 feet x 2 one seated rest break  asisst for safety such that recliner was following.  NO KI. Reported "minimal" knee pain.  Assisted back to bed per pt request to rest. Pt will need ST Rehab at SNF to address mobility and functional decline prior to safely returning home.   Recommendations for follow up therapy are one component of a multi-disciplinary discharge planning process, led by the attending physician.  Recommendations may be updated based on patient status, additional functional criteria and insurance authorization.  Follow Up Recommendations  Skilled nursing-short term rehab (<3 hours/day) Can patient physically be transported by private vehicle: No   Assistance Recommended at Discharge Frequent or constant Supervision/Assistance  Patient can return home with the following Two people to help with walking and/or transfers;Two people to help with bathing/dressing/bathroom;Assistance with cooking/housework;Direct supervision/assist for medications management;Direct supervision/assist for financial management;Assist for transportation;Help with stairs or ramp for entrance   Equipment Recommendations  None recommended by PT    Recommendations for Other Services       Precautions /  Restrictions Precautions Precautions: Fall Precaution Comments: "When off heparin resume regular post op prototcol" per ortho Restrictions Weight Bearing Restrictions: No LLE Weight Bearing: Weight bearing as tolerated Other Position/Activity Restrictions: may resume ROM when off Heparin     Mobility  Bed Mobility Overal bed mobility: Needs Assistance Bed Mobility: Supine to Sit, Sit to Supine     Supine to sit: Min assist, Mod assist     General bed mobility comments: Min Assist to support L LE and Mod Assist fpr upper body and to complete scooting to EOB which was difficult.    Transfers Overall transfer level: Needs assistance Equipment used: Rolling walker (2 wheels) Transfers: Sit to/from Stand, Bed to chair/wheelchair/BSC Sit to Stand: Mod assist, Min assist, +2 physical assistance Stand pivot transfers: Mod assist, +2 physical assistance Step pivot transfers: Mod assist       General transfer comment: First assisted from elevated bed to [facility] Mod Asisst + 2 1/4 pivot with 50% VC's on proper hand placement and transfer.  Second, assisted off BSC + 1 Mod Assist with posterior lean and 75% VC's on proper hand transfer to walker.  Unsteady.  HIGH FALL RISK.    Ambulation/Gait Ambulation/Gait assistance: Mod assist, +2 physical assistance, +2 safety/equipment Gait Distance (Feet): 32 Feet (18 feet x 2) Assistive device: Rolling walker (2 wheels) Gait Pattern/deviations: Step-to pattern, Decreased step length - right, Decreased step length - left, Shuffle, Trunk flexed Gait velocity: decreased     General Gait Details: tolerated an increased distance of 18 feet x 2 one seated rest break  asisst for safety such that recliner was following.  NO KI. Reported "minimal" knee pain.   Stairs  Wheelchair Mobility    Modified Rankin (Stroke Patients Only)       Balance                                            Cognition  Arousal/Alertness: Awake/alert Behavior During Therapy: WFL for tasks assessed/performed Overall Cognitive Status: Within Functional Limits for tasks assessed                                 General Comments: AxO x 3 feeling "much better" every day.        Exercises      General Comments        Pertinent Vitals/Pain Pain Assessment Pain Assessment: No/denies pain    Home Living                          Prior Function            PT Goals (current goals can now be found in the care plan section) Progress towards PT goals: Progressing toward goals    Frequency    7X/week      PT Plan Current plan remains appropriate    Co-evaluation              AM-PAC PT "6 Clicks" Mobility   Outcome Measure  Help needed turning from your back to your side while in a flat bed without using bedrails?: A Lot Help needed moving from lying on your back to sitting on the side of a flat bed without using bedrails?: A Lot Help needed moving to and from a bed to a chair (including a wheelchair)?: A Lot Help needed standing up from a chair using your arms (e.g., wheelchair or bedside chair)?: A Lot Help needed to walk in hospital room?: A Lot Help needed climbing 3-5 steps with a railing? : Total 6 Click Score: 11    End of Session Equipment Utilized During Treatment: Gait belt Activity Tolerance: Patient tolerated treatment well Patient left: in bed;with call bell/phone within reach;with bed alarm set Nurse Communication: Mobility status PT Visit Diagnosis: Difficulty in walking, not elsewhere classified (R26.2);Unsteadiness on feet (R26.81);Dizziness and giddiness (R42) Pain - Right/Left: Left Pain - part of body: Knee     Time: 5809-9833 PT Time Calculation (min) (ACUTE ONLY): 26 min  Charges:  $Gait Training: 8-22 mins $Therapeutic Activity: 8-22 mins                    Rica Koyanagi  PTA [place] Office M-F           [phone] Weekend pager [phone]

## 2022-03-02 DIAGNOSIS — R338 Other retention of urine: Secondary | ICD-10-CM | POA: Diagnosis not present

## 2022-03-02 DIAGNOSIS — I480 Paroxysmal atrial fibrillation: Secondary | ICD-10-CM | POA: Diagnosis not present

## 2022-03-02 LAB — PROTIME-INR
INR: 1.8 — ABNORMAL HIGH (ref 0.8–1.2)
Prothrombin Time: 20.8 seconds — ABNORMAL HIGH (ref 11.4–15.2)

## 2022-03-02 LAB — CBC WITH DIFFERENTIAL/PLATELET
Abs Immature Granulocytes: 0.88 10*3/uL — ABNORMAL HIGH (ref 0.00–0.07)
Basophils Absolute: 0.1 10*3/uL (ref 0.0–0.1)
Basophils Relative: 1 %
Eosinophils Absolute: 0.3 10*3/uL (ref 0.0–0.5)
Eosinophils Relative: 1 %
HCT: 33 % — ABNORMAL LOW (ref 36.0–46.0)
Hemoglobin: 10.3 g/dL — ABNORMAL LOW (ref 12.0–15.0)
Immature Granulocytes: 4 %
Lymphocytes Relative: 42 %
Lymphs Abs: 8.7 10*3/uL — ABNORMAL HIGH (ref 0.7–4.0)
MCH: 30.6 pg (ref 26.0–34.0)
MCHC: 31.2 g/dL (ref 30.0–36.0)
MCV: 97.9 fL (ref 80.0–100.0)
Monocytes Absolute: 0.9 10*3/uL (ref 0.1–1.0)
Monocytes Relative: 5 %
Neutro Abs: 9.7 10*3/uL — ABNORMAL HIGH (ref 1.7–7.7)
Neutrophils Relative %: 47 %
Platelets: 312 10*3/uL (ref 150–400)
RBC: 3.37 MIL/uL — ABNORMAL LOW (ref 3.87–5.11)
RDW: 16.5 % — ABNORMAL HIGH (ref 11.5–15.5)
WBC: 20.6 10*3/uL — ABNORMAL HIGH (ref 4.0–10.5)
nRBC: 0.2 % (ref 0.0–0.2)

## 2022-03-02 LAB — HEPARIN LEVEL (UNFRACTIONATED): Heparin Unfractionated: 0.54 IU/mL (ref 0.30–0.70)

## 2022-03-02 LAB — PATHOLOGIST SMEAR REVIEW

## 2022-03-02 MED ORDER — WARFARIN SODIUM 6 MG PO TABS
6.0000 mg | ORAL_TABLET | Freq: Once | ORAL | Status: AC
  Administered 2022-03-02: 6 mg via ORAL
  Filled 2022-03-02: qty 1

## 2022-03-02 NOTE — Progress Notes (Signed)
Occupational Therapy Treatment Patient Details Name: [name] MRN: [id number] DOB: 10/21/1929 Today's Date: 03/02/2022   History of present illness Patient is a 88 y.o. female s/p L-TKA on 02/17/22. patient was noted to trasnfer to stepdown unit with crital value for sodium. PMH significant for seizures, HTN, GERD, breast cancer (2013), OA, polymyalgia rheumatica, R-TKA 2021, afib, HLD, RA, Required pressors and heparin for possible PE.   OT comments  Patient was educated on deep breathing and low pitch humming to relax pelvic floor to increase ability to pass urine. Patient was min A for transfer from edge of bed to [facility] with increased time and KI in place. Patient would continue to benefit from skilled OT services at this time while admitted and after d/c to address noted deficits in order to improve overall safety and independence in ADLs.     Recommendations for follow up therapy are one component of a multi-disciplinary discharge planning process, led by the attending physician.  Recommendations may be updated based on patient status, additional functional criteria and insurance authorization.    Follow Up Recommendations  Skilled nursing-short term rehab (<3 hours/day)    Assistance Recommended at Discharge Frequent or constant Supervision/Assistance  Patient can return home with the following  Direct supervision/assist for medications management;Help with stairs or ramp for entrance;Assist for transportation;Assistance with cooking/housework;A lot of help with bathing/dressing/bathroom;A lot of help with walking and/or transfers   Equipment Recommendations  Other (comment)    Recommendations for Other Services      Precautions / Restrictions Precautions Precautions: Fall Required Braces or Orthoses: Knee Immobilizer - Left Knee Immobilizer - Left: On at all times Restrictions Weight Bearing Restrictions: No LLE Weight Bearing: Weight bearing as tolerated        Mobility Bed Mobility Overal bed mobility: Needs Assistance Bed Mobility: Supine to Sit     Supine to sit: Min assist, HOB elevated          Transfers                         Balance                                           ADL either performed or assessed with clinical judgement   ADL Overall ADL's : Needs assistance/impaired                         Toilet Transfer: Moderate assistance;Rolling walker (2 wheels);BSC/3in1;Stand-pivot Toilet Transfer Details (indicate cue type and reason): with KI in place. Toileting- Clothing Manipulation and Hygiene: Maximal assistance;Sit to/from stand Toileting - Clothing Manipulation Details (indicate cue type and reason): unable to stand with no UE support.            Extremity/Trunk Assessment              Vision       Perception     Praxis      Cognition Arousal/Alertness: Awake/alert Behavior During Therapy: WFL for tasks assessed/performed Overall Cognitive Status: Within Functional Limits for tasks assessed                                          Exercises      Shoulder Instructions  General Comments      Pertinent Vitals/ Pain       Pain Assessment Pain Assessment: Faces Faces Pain Scale: Hurts a little bit Pain Location: L knee Pain Descriptors / Indicators: Discomfort, Grimacing, Guarding, Operative site guarding Pain Intervention(s): Limited activity within patient's tolerance, Monitored during session, Repositioned  Home Living                                          Prior Functioning/Environment              Frequency  Min 2X/week        Progress Toward Goals  OT Goals(current goals can now be found in the care plan section)  Progress towards OT goals: Progressing toward goals     Plan Discharge plan remains appropriate    Co-evaluation                 AM-PAC OT "6 Clicks" Daily  Activity     Outcome Measure   Help from another person eating meals?: None Help from another person taking care of personal grooming?: A Little Help from another person toileting, which includes using toliet, bedpan, or urinal?: A Lot Help from another person bathing (including washing, rinsing, drying)?: A Lot Help from another person to put on and taking off regular upper body clothing?: A Lot Help from another person to put on and taking off regular lower body clothing?: Total 6 Click Score: 14    End of Session Equipment Utilized During Treatment: Gait belt;Rolling walker (2 wheels);Left knee immobilizer  OT Visit Diagnosis: Unsteadiness on feet (R26.81);Muscle weakness (generalized) (M62.81) Pain - Right/Left: Left Pain - part of body: Knee   Activity Tolerance Patient tolerated treatment well   Patient Left with call bell/phone within reach;in chair;with chair alarm set   Nurse Communication Other (comment) (ok to see patient)        Time: 3568-6168 OT Time Calculation (min): 24 min  Charges: OT General Charges $OT Visit: 1 Visit OT Treatments $Self Care/Home Management : 23-37 mins  Rennie Plowman, MS Acute Rehabilitation Department Office# [phone]   Marcellina Millin 03/02/2022, 1:37 PM

## 2022-03-02 NOTE — Progress Notes (Signed)
Subjective: 13 Days Post-Op Procedure(s) (LRB): TOTAL KNEE ARTHROPLASTY (Left) Patient seen in rounds for Dr. [name] Patient reports pain as mild.   Was able to get up with PT yesterday Having difficulty with voiding on her own, had to be in and out cathed last night/this am  Denies SOB/CP, N/V  Objective: Vital signs in last 24 hours: Temp:  [98.1 F (36.7 C)] 98.1 F (36.7 C) (09/26 0614) Pulse Rate:  [62-94] 83 (09/26 0614) Resp:  [14-18] 18 (09/26 0614) BP: (127-132)/(45-74) 132/74 (09/26 0614) SpO2:  [93 %-96 %] 96 % (09/26 0614) Weight:  [66.5 kg] 66.5 kg (09/26 0500)  Intake/Output from previous day: 09/25 0701 - 09/26 0700 In: 427 [P.O.:350; I.V.:77] Out: 3850 [Urine:3850] Intake/Output this shift: Total I/O In: 377 [P.O.:300; I.V.:77] Out: 1650 [Urine:1650]  Recent Labs    02/28/22 0327 03/01/22 0334 03/02/22 0347  HGB 10.9* 10.4* 10.3*   Recent Labs    03/01/22 0334 03/02/22 0347  WBC 23.7* 20.6*  RBC 3.35* 3.37*  HCT 32.3* 33.0*  PLT 301 312   Recent Labs    02/28/22 0327 03/01/22 0334  NA 136 136  K 3.5 3.5  CL 98 99  CO2 27 29  BUN 18 19  CREATININE 0.65 0.51  GLUCOSE 107* 95  CALCIUM 8.6* 8.4*   Recent Labs    03/01/22 0334 03/02/22 0347  INR 1.3* 1.8*    Neurologically intact Neurovascular intact Sensation intact distally Intact pulses distally Dorsiflexion/Plantar flexion intact Incision: dressing C/D/I and scant drainage Compartment soft   Assessment/Plan: 13 Days Post-Op Procedure(s) (LRB): TOTAL KNEE ARTHROPLASTY (Left) Up with therapy, continue working with therapy Discharge to SNF, waiting for placement From Ortho stand point okay to be discharge, pain meds rx have been placed in chart  DVT ppx: Warfarin with Heparin bridge, waiting for INR  Tried flomax last night to see if that will help with urin retention      Drue Novel, PA-C [facility] with Dr. [name] [phone] 03/02/2022, 6:44 AM

## 2022-03-02 NOTE — Discharge Instructions (Addendum)
Information on my medicine - Coumadin   (Warfarin)  This medication education was reviewed with me or my healthcare representative as part of my discharge preparation.   Why was Coumadin prescribed for you? Coumadin was prescribed for you because you have a blood clot or a medical condition that can cause an increased risk of forming blood clots. Blood clots can cause serious health problems by blocking the flow of blood to the heart, lung, or brain. Coumadin can prevent harmful blood clots from forming. As a reminder your indication for Coumadin is:  Stroke Prevention because of Atrial Fibrillation  What test will check on my response to Coumadin? While on Coumadin (warfarin) you will need to have an INR test regularly to ensure that your dose is keeping you in the desired range. The INR (international normalized ratio) number is calculated from the result of the laboratory test called prothrombin time (PT).  If an INR APPOINTMENT HAS NOT ALREADY BEEN MADE FOR YOU please schedule an appointment to have this lab work done by your health care provider within 7 days. Your INR goal is usually a number between:  2 to 3 or your provider may give you a more narrow range like 2-2.5.  Ask your health care provider during an office visit what your goal INR is.  What  do you need to  know  About  COUMADIN? Take Coumadin (warfarin) exactly as prescribed by your healthcare provider about the same time each day.  DO NOT stop taking without talking to the doctor who prescribed the medication.  Stopping without other blood clot prevention medication to take the place of Coumadin may increase your risk of developing a new clot or stroke.  Get refills before you run out.  What do you do if you miss a dose? If you miss a dose, take it as soon as you remember on the same day then continue your regularly scheduled regimen the next day.  Do not take two doses of Coumadin at the same time.  Important Safety  Information A possible side effect of Coumadin (Warfarin) is an increased risk of bleeding. You should call your healthcare provider right away if you experience any of the following: Bleeding from an injury or your nose that does not stop. Unusual colored urine (red or dark brown) or unusual colored stools (red or black). Unusual bruising for unknown reasons. A serious fall or if you hit your head (even if there is no bleeding).  Some foods or medicines interact with Coumadin (warfarin) and might alter your response to warfarin. To help avoid this: Eat a balanced diet, maintaining a consistent amount of Vitamin K. Notify your provider about major diet changes you plan to make. Avoid alcohol or limit your intake to 1 drink for women and 2 drinks for men per day. (1 drink is 5 oz. wine, 12 oz. beer, or 1.5 oz. liquor.)  Make sure that ANY health care provider who prescribes medication for you knows that you are taking Coumadin (warfarin).  Also make sure the healthcare provider who is monitoring your Coumadin knows when you have started a new medication including herbals and non-prescription products.  Coumadin (Warfarin)  Major Drug Interactions  Increased Warfarin Effect Decreased Warfarin Effect  Alcohol (large quantities) Antibiotics (esp. Septra/Bactrim, Flagyl, Cipro) Amiodarone (Cordarone) Aspirin (ASA) Cimetidine (Tagamet) Megestrol (Megace) NSAIDs (ibuprofen, naproxen, etc.) Piroxicam (Feldene) Propafenone (Rythmol SR) Propranolol (Inderal) Isoniazid (INH) Posaconazole (Noxafil) Barbiturates (Phenobarbital) Carbamazepine (Tegretol) Chlordiazepoxide (Librium) Cholestyramine (Questran) Griseofulvin Oral Contraceptives   Rifampin Sucralfate (Carafate) Vitamin K   Coumadin (Warfarin) Major Herbal Interactions  Increased Warfarin Effect Decreased Warfarin Effect  Garlic Ginseng Ginkgo biloba Coenzyme Q10 Green tea St. John's wort    Coumadin (Warfarin) FOOD  Interactions  Eat a consistent number of servings per week of foods HIGH in Vitamin K (1 serving =  cup)  Collards (cooked, or boiled & drained) Kale (cooked, or boiled & drained) Mustard greens (cooked, or boiled & drained) Parsley *serving size only =  cup Spinach (cooked, or boiled & drained) Swiss chard (cooked, or boiled & drained) Turnip greens (cooked, or boiled & drained)  Eat a consistent number of servings per week of foods MEDIUM-HIGH in Vitamin K (1 serving = 1 cup)  Asparagus (cooked, or boiled & drained) Broccoli (cooked, boiled & drained, or raw & chopped) Brussel sprouts (cooked, or boiled & drained) *serving size only =  cup Lettuce, raw (green leaf, endive, romaine) Spinach, raw Turnip greens, raw & chopped   These websites have more information on Coumadin (warfarin):  www.coumadin.com; www.ahrq.gov/consumer/coumadin.htm;   

## 2022-03-02 NOTE — TOC Progression Note (Signed)
Transition of Care [facility]) - Progression Note    Patient Details  Name: [name] MRN: [id number] Date of Birth: [birth year]  Transition of [facility]) CM/SW Contact  Lennart Pall, LCSW Phone Number: 03/02/2022, 1:04 PM  Clinical Narrative:     Met with pt and son to review SNF bed offers and they have accepted bed at [facility].  Pt and facility are aware that pt not medically ready for dc today but may be ready as early as tomorrow. Will go ahead and begin insurance auth in preparation.  Expected Discharge Plan: [place] Barriers to Discharge: Continued Medical Work up  Expected Discharge Plan and Services Expected Discharge Plan: [place] In-house Referral: NA Discharge Planning Services: CM Consult Post Acute Care Choice: [place] Living arrangements for the past 2 months: Single Family Home Expected Discharge Date: 02/21/22               DME Arranged: N/A DME Agency: NA                   Social Determinants of Health (SDOH) Interventions    Readmission Risk Interventions    02/25/2022    2:27 PM  Readmission Risk Prevention Plan  Transportation Screening Complete  PCP or Specialist Appt within 5-7 Days Complete  Home Care Screening Complete  Medication Review (RN CM) Complete

## 2022-03-02 NOTE — Progress Notes (Signed)
Physical Therapy Treatment Patient Details Name: [name] MRN: [id number] DOB: 10-10-1929 Today's Date: 03/02/2022   History of Present Illness Patient is a 88 y.o. female s/p L-TKA on 02/17/22. patient was noted to trasnfer to stepdown unit with crital value for sodium. PMH significant for seizures, HTN, GERD, breast cancer (2013), OA, polymyalgia rheumatica, R-TKA 2021, afib, HLD, RA, Required pressors and heparin for possible PE.    PT Comments    POD # 13 Pt was OOB earlier with OT now back in bed.  Assisted OOB to amb in hallway required increased time. General Gait Details: required 2 seated rest breaks to achieve a total of 37 feet.  Progressing slowly.  Avg HR 109.  NO KI. Assisted back to bed and positioned to comfort.   Pt will need ST Rehab at SNF to address mobility and functional decline prior to safely returning home.   Recommendations for follow up therapy are one component of a multi-disciplinary discharge planning process, led by the attending physician.  Recommendations may be updated based on patient status, additional functional criteria and insurance authorization.  Follow Up Recommendations  Skilled nursing-short term rehab (<3 hours/day) Can patient physically be transported by private vehicle: No   Assistance Recommended at Discharge Frequent or constant Supervision/Assistance  Patient can return home with the following Two people to help with walking and/or transfers;Two people to help with bathing/dressing/bathroom;Assistance with cooking/housework;Direct supervision/assist for medications management;Direct supervision/assist for financial management;Assist for transportation;Help with stairs or ramp for entrance   Equipment Recommendations  None recommended by PT    Recommendations for Other Services       Precautions / Restrictions Precautions Precautions: Fall Precaution Comments: "When off heparin resume regular post op prototcol" per  ortho Required Braces or Orthoses: Knee Immobilizer - Left Knee Immobilizer - Left: On at all times Restrictions Weight Bearing Restrictions: No LLE Weight Bearing: Weight bearing as tolerated Other Position/Activity Restrictions: may resume ROM when off Heparin     Mobility  Bed Mobility Overal bed mobility: Needs Assistance Bed Mobility: Supine to Sit, Sit to Supine     Supine to sit: Min assist, HOB elevated Sit to supine: Mod assist   General bed mobility comments: Min Assist to support L LE and Mod Assist fpr upper body and to complete scooting to EOB which was difficult.    Transfers Overall transfer level: Needs assistance Equipment used: Rolling walker (2 wheels) Transfers: Sit to/from Stand Sit to Stand: Mod assist           General transfer comment: required Mod Assist and increased effort to rise from bed with 25% VC's on proper hand placement esp with stand to sit to control desend.    Ambulation/Gait Ambulation/Gait assistance: Mod assist Gait Distance (Feet): 37 Feet Assistive device: Rolling walker (2 wheels) Gait Pattern/deviations: Step-to pattern, Decreased step length - right, Decreased step length - left, Shuffle, Trunk flexed Gait velocity: decreased     General Gait Details: required 2 seated rest breaks to achieve a total of 37 feet.  Progressing slowly.  Avg HR 109.  NO KI.   Stairs             Wheelchair Mobility    Modified Rankin (Stroke Patients Only)       Balance  Cognition Arousal/Alertness: Awake/alert Behavior During Therapy: WFL for tasks assessed/performed Overall Cognitive Status: Within Functional Limits for tasks assessed                                 General Comments: AxO x 3 feeling "much better" every day.        Exercises      General Comments        Pertinent Vitals/Pain Pain Assessment Pain Assessment: Faces Faces  Pain Scale: Hurts a little bit Pain Location: L knee Pain Descriptors / Indicators: Discomfort, Grimacing, Guarding, Operative site guarding Pain Intervention(s): Monitored during session, Repositioned    Home Living                          Prior Function            PT Goals (current goals can now be found in the care plan section) Progress towards PT goals: Progressing toward goals    Frequency    7X/week      PT Plan Current plan remains appropriate    Co-evaluation              AM-PAC PT "6 Clicks" Mobility   Outcome Measure  Help needed turning from your back to your side while in a flat bed without using bedrails?: A Lot Help needed moving from lying on your back to sitting on the side of a flat bed without using bedrails?: A Lot Help needed moving to and from a bed to a chair (including a wheelchair)?: A Lot Help needed standing up from a chair using your arms (e.g., wheelchair or bedside chair)?: A Lot Help needed to walk in hospital room?: A Lot Help needed climbing 3-5 steps with a railing? : Total 6 Click Score: 11    End of Session Equipment Utilized During Treatment: Gait belt Activity Tolerance: Patient tolerated treatment well Patient left: in bed;with call bell/phone within reach;with bed alarm set Nurse Communication: Mobility status PT Visit Diagnosis: Difficulty in walking, not elsewhere classified (R26.2);Unsteadiness on feet (R26.81);Dizziness and giddiness (R42) Pain - Right/Left: Left Pain - part of body: Knee     Time: 8325-4982 PT Time Calculation (min) (ACUTE ONLY): 26 min  Charges:  $Gait Training: 8-22 mins $Therapeutic Activity: 8-22 mins                     Rica Koyanagi  PTA Acute  Rehabilitation Services Office M-F          [phone] Weekend pager [phone]

## 2022-03-02 NOTE — Progress Notes (Signed)
PROGRESS NOTE   [name]  VCB:[id number]    DOB: 1929/10/31    DOA: 02/17/2022  PCP: [name], MD   I have briefly reviewed patients previous medical records in [facility].   Brief Narrative:  PCCM >TRH for continued medical consult 02/25/2022: 88 year old female underwent left TKA by Dr. [name] on 02/17/2022.  9/16, noted to have AMS with hypersomnolence and labs revealed an acute drop in her sodium to 116, previously 136.  TRH and nephrology were consulted and she was started on 3% normal saline.  On 9/17, she developed A-fib with RVR, cardiology was consulted, started on Cardizem and converted to NSR.  9/18, IV antibiotics initiated for left lower lobe atelectasis versus infiltrate.  9/19, went back into A-fib with RVR, started on amiodarone bolus and infusion, developed transient shock that resolved after fluid bolus, briefly required low-dose norepinephrine and steroids by PCCM.  She was started on heparin infusion for PE prophylaxis.  DVT negative on ultrasound.  Cannot use DOAC's because of being on phenobarbital and which reduces DOAC's efficacy.  Thereby after discussing with patient/family, initiated warfarin with heparin bridging.  Laxative related diarrhea.  DC to SNF pending therapeutic INR.  Ongoing issues with urinary retention.   Assessment & Plan:  Principal Problem:   Osteoarthritis of left knee Active Problems:   Acute metabolic encephalopathy   Acute hyponatremia   HCAP (healthcare-associated pneumonia)   AKI (acute kidney injury) ([place])   Hypotension   Paroxysmal atrial fibrillation with RVR (HCC)   PMR (polymyalgia rheumatica) (HCC)   DNR (do not resuscitate)   Hypotension - resolved Likely result of Amiodarone infusion d/t the timing.  Potential HCAP contributing.  Briefly required pressors in ICU, has been off for several days.  Resolved.  Midodrine quickly tapered and discontinued.  Tolerating oral amiodarone.  Has been weaned back to  home dose of prednisone 5 Mg daily.  Paroxysmal Atrial Fibrillation Hx HTN, MVR, HTN, HLD Initially given Cardizem but converted and later started on amiodarone, subsequently developed hypotension.   -goal K+>4, Mg+>2  -appreciate Cardiology.  Continue oral amiodarone -continue heparin infusion bridging until INR therapeutic on warfarin-started 9/23.  -Orthopedics has cleared to start oral anticoagulation. - Cannot use DOAC's because of being on phenobarbital long-term, and which reduces DOAC's efficacy.  Thereby initiated warfarin with heparin bridging. -Eventually as outpatient, can follow-up with Neurology and if neurology is able to switch her from phenobarbital to an alternate agent without interactions with DOAC's, then she can be switched from warfarin to DOAC's.   -INR 1.8.  Hopefully will be therapeutic tomorrow.  Remains in sinus rhythm with PACs on telemetry.     HCAP vs Atelectasis Acute Hypoxic Respiratory Failure CXR c/f LLL infiltrate vs atelectasis.  Newly required O2 via [place], since off and on RA.  DVT r/o. CTA initially not feasible d/t AKI to r/o PE.  Started on heparin infusion for Afib.  Started 9/23. -suspect hypoxia driven by atelectasis, post surgical.  Less likely PE -D5/x abx.  Encourage incentive spirometry.  Mobilize.  Due to diarrhea, leukocytosis and has already completed almost 5 days course, cefepime discontinued 9/23. -Communicated with Dr. Verlee Monte, PCCM on 9/21 and if she is going to be on anticoagulation anyway for A-fib then agrees that we can defer CTA chest.  Also low index of suspicion for PE.    Acute diarrhea: Secondary to aggressive bowel regimen.  Resolved.  Having regular BM this morning.   AKI Hyponatremia Resolved, no longer  on 3% NS as of 9/18 -appreciate Nephrology, signed off.  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  They felt AKI was related to bladder retention, bladder scan showed 900 mL and Foley catheter was placed. -Has great urine output.  Discontinued IV fluids. -Replace electrolytes as indicated -Avoid nephrotoxic agents, ensure adequate renal perfusion  Hypokalemia: Replaced p.o. potassium and IV magnesium yesterday.  Follow-up BMP in AM.  Hypophosphatemia: Replaced yesterday.  Follow BMP in AM.  Leukocytosis: Likely multifactorial related to infectious etiology, steroids and stress.  Upon chart review, has chronic mild leukocytosis in the 11-12 range.  Despite no clear infectious etiology by symptoms or signs, leukocytosis slowly increasing.  We will further decrease prednisone dose to her baseline of 5 mg daily.  Continues to gradually improve.  Continue to trend daily CBCs.  Normocytic anemia: Stable   Acute Urinary Retention  -Foley catheter discontinued 9/21.  However now having ongoing recurrent acute urinary retention, required in and out Foley catheter on 9/24 and night of 9/25.  Added Flomax.  Discussed with RN yesterday and this morning, attempt to get her onto bedside commode during each void and encourage voiding.  She has had high-volume urine output a couple times not needing in and out cath yesterday and the day before.  If issue persists, discuss with urology in AM.   Acute Metabolic Encephalopathy Likely related to hypotension, hyponatremia > as BP improved so did mental status.   -supportive care -limit sedating medications  -delirium precautions  -Resolved.   Hx Seizures -continue home phenobarbital.  As per patient and daughters at bedside, has been on this for 50+ years.  Last seizure in 1987.  Please see discussion above regarding outpatient neurology consultation and management of AEDs.   Post op L TKA -post op mgmt per Ortho  -reduce pain regimen > limited use, encephalopathy risk  Cleared from orthopedic standpoint for  DC.  Osteoarthritis/rheumatoid arthritis Patient on chronic prednisone 5 Mg daily, continue  Body mass index is 27.7 kg/m.    DVT prophylaxis: SCDs Start: 02/17/22 1101     Code Status: DNR:  Family Communication: None at bedside Disposition:  Remains inpatient appropriate.  Slowly approaching stability for DC to SNF pending therapeutic INR and resolution of urinary retention, hopefully in the next 1 to 2 days.     Consultants:   PCCM Cardiology Nephrology  Procedures:   As noted above  Antimicrobials:   IV cefepime 9/19 > 9/23 IV vancomycin x1 and metronidazole x1 month 9/19.   Subjective:  Denies complaints.  Had normal BM this morning.  As per discussion with RN, was able to spontaneously void yesterday morning but last night required in and out catheterization.  Objective:   Vitals:   03/01/22 1312 03/01/22 2154 03/02/22 0500 03/02/22 0614  BP: (!) 127/45 132/61  132/74  Pulse: 62 94  83  Resp: '14 18  18  '$ Temp: 98.1 F (36.7 C) 98.1 F (36.7 C)  98.1 F (36.7 C)  TempSrc: Oral     SpO2: 93% 95%  96%  Weight:   66.5 kg   Height:        General exam: Elderly female, moderately built and nourished sitting on bedside commode.  No distress. Respiratory system: Clear to auscultation.  No increased work of breathing. Cardiovascular system: S1 & S2 heard, RRR. No JVD, murmurs, rubs,  gallops or clicks.  RLE without edema.  Trace left leg edema (postop).  Telemetry personally reviewed: Sinus rhythm with few PACs. Gastrointestinal system: Abdomen is nondistended, soft and nontender.  No bladder obviously palpable. Normal bowel sounds heard. Central nervous system: Alert and oriented. No focal neurological deficits. Extremities: Symmetric 5 x 5 power.  Left knee postop splint has been removed.  Surgical site without acute findings.  Left knee and lower leg with extensive bruising from surgery.  Left forearm bruising without overt bleeding. Skin: No rashes, lesions or  ulcers Psychiatry: Judgement and insight appear normal. Mood & affect appropriate.     Data Reviewed:   I have personally reviewed following labs and imaging studies   CBC: Recent Labs  Lab 02/28/22 0327 03/01/22 0334 03/02/22 0347  WBC 26.1* 23.7* 20.6*  NEUTROABS  --  11.0* 9.7*  HGB 10.9* 10.4* 10.3*  HCT 34.5* 32.3* 33.0*  MCV 97.2 96.4 97.9  PLT 307 301 098    Basic Metabolic Panel: Recent Labs  Lab 02/24/22 0316 02/25/22 0329 02/25/22 1950 02/26/22 0247 02/27/22 0322 02/28/22 0327 03/01/22 0334  NA 137 141  --  137 138 136 136  K 3.4* 2.8* 3.6 3.3* 4.5 3.5 3.5  CL 107 109  --  103 101 98 99  CO2 21* 24  --  '26 25 27 29  '$ GLUCOSE 145* 103*  --  103* 93 107* 95  BUN 33* 19  --  '11 13 18 19  '$ CREATININE 1.06* 0.62  --  0.57 0.75 0.65 0.51  CALCIUM 8.2* 7.9*  --  7.8* 8.5* 8.6* 8.4*  MG 2.4 2.1  --   --   --   --  1.8  PHOS 3.0 1.5* 2.8 2.4* 3.3  --  2.4*    Liver Function Tests: Recent Labs  Lab 02/24/22 0316 02/25/22 0329 02/26/22 0247 02/27/22 0322 03/01/22 0334  AST 22  --   --   --   --   ALT 24  --   --   --   --   ALKPHOS 81  --   --   --   --   BILITOT 1.2  --   --   --   --   PROT 5.4*  --   --   --   --   ALBUMIN 2.6* 2.5* 2.6* 2.9* 2.9*    CBG: No results for input(s): "GLUCAP" in the last 168 hours.   Microbiology Studies:   Recent Results (from the past 240 hour(s))  Culture, blood (Routine X 2) w Reflex to ID Panel     Status: None   Collection Time: 02/20/22  7:55 PM   Specimen: BLOOD  Result Value Ref Range Status   Specimen Description   Final    BLOOD LEFT ANTECUBITAL Performed at [facility] [address], [place], [place] [address]    Special Requests   Final    BOTTLES DRAWN AEROBIC AND ANAEROBIC Blood Culture adequate volume Performed at [facility] [address] [place], [place] [address]    Culture   Final    NO GROWTH 5 DAYS Performed at [facility], [place] [address], [place], [place] [address]    Report Status 02/26/2022 FINAL  Final  Culture, blood (Routine X 2) w Reflex to ID Panel     Status: None   Collection Time: 02/20/22  8:07 PM   Specimen: BLOOD  Result Value Ref Range Status  Specimen Description   Final    BLOOD BLOOD RIGHT HAND Performed at [facility] [address], [place], [place] [address]    Special Requests   Final    BOTTLES DRAWN AEROBIC ONLY Blood Culture results may not be optimal due to an inadequate volume of blood received in culture bottles Performed at [facility] [address], [place], [place] [address]    Culture   Final    NO GROWTH 5 DAYS Performed at [facility], [place] [address] [place], [place] [address]    Report Status 02/26/2022 FINAL  Final  Culture, blood (Routine X 2) w Reflex to ID Panel     Status: None   Collection Time: 02/23/22  2:10 PM   Specimen: BLOOD  Result Value Ref Range Status   Specimen Description   Final    BLOOD SITE NOT SPECIFIED Performed at [facility] [address] [place], [place] [address]    Special Requests   Final    BOTTLES DRAWN AEROBIC ONLY Blood Culture adequate volume Performed at [facility] [address], [place], [place] [address]    Culture   Final    NO GROWTH 5 DAYS Performed at [facility], [place] [address], [place], [place] [address]    Report Status 02/28/2022 FINAL  Final  Culture, blood (Routine X 2) w Reflex to ID Panel     Status: None   Collection Time: 02/23/22  2:10 PM   Specimen: BLOOD LEFT HAND  Result Value Ref Range Status   Specimen Description BLOOD LEFT HAND  Final   Special Requests   Final    BOTTLES DRAWN AEROBIC ONLY Blood Culture adequate volume   Culture   Final    NO GROWTH 5 DAYS Performed at [facility], [place] [address], [place], [place] [address]    Report Status 02/28/2022 FINAL  Final  Urine Culture     Status:  None   Collection Time: 02/23/22  3:05 PM   Specimen: Urine, Catheterized  Result Value Ref Range Status   Specimen Description   Final    URINE, CATHETERIZED Performed at [facility] [address], [place], [place] [address]    Special Requests   Final    NONE Performed at [facility], [place] [address], [place], [place] [address]    Culture   Final    NO GROWTH Performed at [facility], [place] [address], [place], [place] [address]    Report Status 02/27/2022 FINAL  Final  MRSA Next Gen by PCR, Nasal     Status: None   Collection Time: 02/23/22  5:49 PM   Specimen: Nasal Mucosa; Nasal Swab  Result Value Ref Range Status   MRSA by PCR Next Gen NOT DETECTED NOT DETECTED Final    Comment: (NOTE) The GeneXpert MRSA Assay (FDA approved for NASAL specimens only), is one component of a comprehensive MRSA colonization surveillance program. It is not intended to diagnose MRSA infection nor to guide or monitor treatment for MRSA infections. Test performance is not FDA approved in patients less than 7 years old. Performed at [facility], [place] [address] [place],  [address]     Radiology Studies:  No results found.  Scheduled Meds:    amiodarone  400 mg Oral BID   ezetimibe  10 mg Oral Daily   feeding supplement  237 mL  Oral BID BM   furosemide  40 mg Oral Daily   metoprolol tartrate  12.5 mg Oral BID   pantoprazole  40 mg Oral Daily   PHENobarbital  97.2 mg Oral QHS   potassium chloride  40 mEq Oral Daily   predniSONE  5 mg Oral Q breakfast   tamsulosin  0.4 mg Oral Daily   warfarin  6 mg Oral ONCE-1600   Warfarin - Pharmacist Dosing Inpatient   Does not apply q1600    Continuous Infusions:    sodium chloride     heparin 1,050 Units/hr (03/02/22 0200)   methocarbamol (ROBAXIN) IV       LOS: 9 days     Vernell Leep, MD,  Vernell Leep, MD,  FACP, [facility], [facility], [facility], [facility]  [facility]  Hospitalist & Physician Advisor [place]   To contact the attending provider between 7A-7P or the covering provider during after hours 7P-7A, please log into the web site [email or web address] and access using universal [place] password for that web site. If you do not have the password, please call the hospital operator.  03/02/2022, 8:48 AM

## 2022-03-02 NOTE — Progress Notes (Signed)
ANTICOAGULATION CONSULT NOTE  Pharmacy Consult for heparin >> warfarin Indication: atrial fibrillation  Allergies  Allergen Reactions   Sulfa Antibiotics Nausea Only   Penicillins Swelling    Tolerated Cephalosporin 01/04/20      Simvastatin Swelling    Patient Measurements: Height: '5\' 1"'$  (154.9 cm) Weight: 66.5 kg (146 lb 9.7 oz) IBW/kg (Calculated) : 47.8 Heparin Dosing Weight: 63.2 kg  Vital Signs: Temp: 98.1 F (36.7 C) (09/26 0614) BP: 132/74 (09/26 0614) Pulse Rate: 83 (09/26 0614)  Labs: Recent Labs    02/28/22 0327 03/01/22 0334 03/02/22 0347  HGB 10.9* 10.4* 10.3*  HCT 34.5* 32.3* 33.0*  PLT 307 301 312  LABPROT 15.6* 15.8* 20.8*  INR 1.3* 1.3* 1.8*  HEPARINUNFRC 0.36 0.60 0.54  CREATININE 0.65 0.51  --     Estimated Creatinine Clearance: 40.8 mL/min (by C-G formula based on SCr of 0.51 mg/dL).   Assessment: 22 yoF with PMH PAF not on anticoag PTA, who experienced AFib/RVR after TKA. She was started on heparin, now bridging to warfarin (more reliable monitoring) due to interaction between her phenobarbital (enzyme inducer) and DOACs.  Today, 03/02/22: - HL 0.54, therapeutic on 1050 units/hr heparin gtt - INR 1.8, approaching therapeutic  - 7.5 mg dose 9/25  - 6 mg doses 9/23 and 9/24 - Hgb 10.3, low but stable - Plts WNL - Regular diet ordered - Phenobarbital may reduce warfarin effect - No bleeding or line issues per RN  Goal of Therapy:  INR 2-3 Heparin level 0.3-0.7 units/ml Monitor platelets by anticoagulation protocol: Yes   Plan:  - Continue heparin gtt at 1050 units/hr - Warfarin 6 mg PO x 1 dose this evening - Daily HL, CBC, and INR - Monitor for signs and symptoms of bleeding  Cannon Kettle, PharmD Candidate 03/02/2022 7:39 AM

## 2022-03-03 LAB — CBC WITH DIFFERENTIAL/PLATELET
Abs Immature Granulocytes: 0.86 10*3/uL — ABNORMAL HIGH (ref 0.00–0.07)
Basophils Absolute: 0.2 10*3/uL — ABNORMAL HIGH (ref 0.0–0.1)
Basophils Relative: 1 %
Eosinophils Absolute: 0.3 10*3/uL (ref 0.0–0.5)
Eosinophils Relative: 1 %
HCT: 35.1 % — ABNORMAL LOW (ref 36.0–46.0)
Hemoglobin: 10.9 g/dL — ABNORMAL LOW (ref 12.0–15.0)
Immature Granulocytes: 4 %
Lymphocytes Relative: 49 %
Lymphs Abs: 9.9 10*3/uL — ABNORMAL HIGH (ref 0.7–4.0)
MCH: 30.8 pg (ref 26.0–34.0)
MCHC: 31.1 g/dL (ref 30.0–36.0)
MCV: 99.2 fL (ref 80.0–100.0)
Monocytes Absolute: 0.8 10*3/uL (ref 0.1–1.0)
Monocytes Relative: 4 %
Neutro Abs: 8.4 10*3/uL — ABNORMAL HIGH (ref 1.7–7.7)
Neutrophils Relative %: 41 %
Platelets: 339 10*3/uL (ref 150–400)
RBC: 3.54 MIL/uL — ABNORMAL LOW (ref 3.87–5.11)
RDW: 16.8 % — ABNORMAL HIGH (ref 11.5–15.5)
WBC: 20.4 10*3/uL — ABNORMAL HIGH (ref 4.0–10.5)
nRBC: 0.1 % (ref 0.0–0.2)

## 2022-03-03 LAB — RENAL FUNCTION PANEL
Albumin: 3.1 g/dL — ABNORMAL LOW (ref 3.5–5.0)
Anion gap: 8 (ref 5–15)
BUN: 13 mg/dL (ref 8–23)
CO2: 27 mmol/L (ref 22–32)
Calcium: 8.6 mg/dL — ABNORMAL LOW (ref 8.9–10.3)
Chloride: 102 mmol/L (ref 98–111)
Creatinine, Ser: 0.58 mg/dL (ref 0.44–1.00)
GFR, Estimated: >60 mL/min (ref >60)
Glucose, Bld: 88 mg/dL (ref 70–99)
Phosphorus: 3.1 mg/dL (ref 2.5–4.6)
Potassium: 3.9 mmol/L (ref 3.5–5.1)
Sodium: 137 mmol/L (ref 135–145)

## 2022-03-03 LAB — PROTIME-INR
INR: 2.6 — ABNORMAL HIGH (ref 0.8–1.2)
Prothrombin Time: 27.2 seconds — ABNORMAL HIGH (ref 11.4–15.2)

## 2022-03-03 LAB — MAGNESIUM: Magnesium: 1.9 mg/dL (ref 1.7–2.4)

## 2022-03-03 LAB — HEPARIN LEVEL (UNFRACTIONATED): Heparin Unfractionated: 0.82 IU/mL — ABNORMAL HIGH (ref 0.30–0.70)

## 2022-03-03 MED ORDER — WARFARIN SODIUM 5 MG PO TABS: 5.0000 mg | ORAL_TABLET | Freq: Every day | ORAL | Status: AC

## 2022-03-03 MED ORDER — ENSURE ENLIVE PO LIQD: 237.0000 mL | Freq: Two times a day (BID) | ORAL | 12 refills | Status: AC

## 2022-03-03 MED ORDER — WARFARIN SODIUM 5 MG PO TABS: 5.0000 mg | ORAL_TABLET | Freq: Every day | ORAL | Status: DC

## 2022-03-03 MED ORDER — POTASSIUM CHLORIDE CRYS ER 20 MEQ PO TBCR: 20.0000 meq | EXTENDED_RELEASE_TABLET | Freq: Every day | ORAL | Status: AC

## 2022-03-03 MED ORDER — AMIODARONE HCL 200 MG PO TABS: 200.0000 mg | ORAL_TABLET | Freq: Every day | ORAL | Status: AC

## 2022-03-03 MED ORDER — TAMSULOSIN HCL 0.4 MG PO CAPS: 0.4000 mg | ORAL_CAPSULE | Freq: Every day | ORAL | Status: AC

## 2022-03-03 NOTE — Progress Notes (Signed)
ANTICOAGULATION CONSULT NOTE  Pharmacy Consult for heparin >> warfarin Indication: atrial fibrillation  Allergies  Allergen Reactions   Sulfa Antibiotics Nausea Only   Penicillins Swelling    Tolerated Cephalosporin 01/04/20      Simvastatin Swelling    Patient Measurements: Height: '5\' 1"'$  (154.9 cm) Weight: 66.5 kg (146 lb 9.7 oz) IBW/kg (Calculated) : 47.8 Heparin Dosing Weight: 63.2 kg  Vital Signs: Temp: 98.5 F (36.9 C) (09/27 0533) BP: 143/79 (09/27 0533) Pulse Rate: 79 (09/27 0533)  Labs: Recent Labs    03/01/22 0334 03/02/22 0347 03/03/22 0337  HGB 10.4* 10.3* 10.9*  HCT 32.3* 33.0* 35.1*  PLT 301 312 339  LABPROT 15.8* 20.8* 27.2*  INR 1.3* 1.8* 2.6*  HEPARINUNFRC 0.60 0.54 0.82*  CREATININE 0.51  --  0.58     Estimated Creatinine Clearance: 40.8 mL/min (by C-G formula based on SCr of 0.58 mg/dL).   Assessment: 20 yoF with PMH PAF not on anticoag PTA, who experienced AFib/RVR after TKA. She was started on heparin, now bridging to warfarin (more reliable monitoring) due to interaction between her phenobarbital (enzyme inducer) and DOACs.  Today, 03/03/22: - INR 2.6, therapeutic x1  - 7.5 mg dose 9/25  - 6 mg doses 9/23, 9/24 and 9/26 - Hgb 10.9, low but stable - Plts WNL - DDI - Phenobarbital may reduce warfarin effect  Goal of Therapy:  INR 2-3 Heparin level 0.3-0.7 units/ml Monitor platelets by anticoagulation protocol: Yes   Plan:  - Discontinue heparin for therapeutic INR - Warfarin 5 mg PO daily - Recommend follow up INR 2-3 days after discharge - Daily INR while inpatient - Monitor for signs and symptoms of bleeding  Tawnya Crook, PharmD, BCPS Clinical Pharmacist 03/03/2022 9:32 AM

## 2022-03-03 NOTE — Progress Notes (Signed)
ANTICOAGULATION CONSULT NOTE  Pharmacy Consult for heparin >> warfarin Indication: atrial fibrillation  Allergies  Allergen Reactions   Sulfa Antibiotics Nausea Only   Penicillins Swelling    Tolerated Cephalosporin 01/04/20      Simvastatin Swelling    Patient Measurements: Height: '5\' 1"'$  (154.9 cm) Weight: 66.5 kg (146 lb 9.7 oz) IBW/kg (Calculated) : 47.8 Heparin Dosing Weight: 63.2 kg  Vital Signs: Temp: 97.8 F (36.6 C) (09/26 2124) BP: 121/63 (09/26 2124) Pulse Rate: 90 (09/26 2124)  Labs: Recent Labs    03/01/22 0334 03/02/22 0347 03/03/22 0337  HGB 10.4* 10.3* 10.9*  HCT 32.3* 33.0* 35.1*  PLT 301 312 339  LABPROT 15.8* 20.8* 27.2*  INR 1.3* 1.8* 2.6*  HEPARINUNFRC 0.60 0.54 0.82*  CREATININE 0.51  --  0.58     Estimated Creatinine Clearance: 40.8 mL/min (by C-G formula based on SCr of 0.58 mg/dL).   Assessment: 63 yoF with PMH PAF not on anticoag PTA, who experienced AFib/RVR after TKA. She was started on heparin, now bridging to warfarin (more reliable monitoring) due to interaction between her phenobarbital (enzyme inducer) and DOACs.  Today, 03/02/22: - HL 0.82, now supra-therapeutic on 1050 units/hr heparin gtt - INR 2.6, therapeutic x1  - 7.5 mg dose 9/25  - 6 mg doses 9/23, 9/24 and 9/26 - Hgb 10.9, low but stable - Plts WNL - Regular diet ordered - Phenobarbital may reduce warfarin effect - No bleeding or line issues per RN  Goal of Therapy:  INR 2-3 Heparin level 0.3-0.7 units/ml Monitor platelets by anticoagulation protocol: Yes   Plan:  - Decrease heparin gtt to 950 units/hr --> can likely d/c heparin once rounding MD evaluates patient - Warfarin 5 mg PO daily -->would recommend d/c home on this dose and recheck INR in ~3 days after discharge.  - Daily HL, CBC, and INR while inpatient - Monitor for signs and symptoms of bleeding  Netta Cedars, PharmD, BCPS 03/03/2022 4:21 AM

## 2022-03-03 NOTE — Plan of Care (Signed)
  Problem: Nutrition: Goal: Adequate nutrition will be maintained Outcome: Progressing   Problem: Pain Managment: Goal: General experience of comfort will improve Outcome: Progressing   

## 2022-03-03 NOTE — Progress Notes (Signed)
Patient seen and examined this morning.  Progress note from yesterday reviewed.  Patient on orthopedic service waiting on medical clearance.  Primarily waiting on INR to become therapeutic which it has today.  INR noted to be 2.6.  Heparin can be discontinued.  Warfarin can be resumed at 5 mg daily.  Amiodarone dose to be decreased to 200 mg once a day.  Other medications as per medication reconciliation.  She has voided urine on her own after removal of Foley catheter.  We will continue Flomax for now.  Can be discontinued once she is more active at the skilled nursing facility and has been voiding without difficulty.  Discussed with patient.  She does not have any questions.  Her vital signs are noted to be stable.  Other labs are stable from this morning.    Please review progress note by Dr. Algis Liming on 9/26 for further details.    Patient is stable for discharge to skilled nursing facility from medical standpoint.  This was communicated to orthopedic service.  [name] 03/03/2022

## 2022-03-03 NOTE — Progress Notes (Signed)
Subjective: 14 Days Post-Op Procedure(s) (LRB): TOTAL KNEE ARTHROPLASTY (Left) Patient seen in rounds this am for Dr. [name] Patient reports pain as mild.   No events overnight Was able to get up yesterday and ambulate with PT Was accepted to [facility] and waiting for INR levels to become therapeutic   Objective: Vital signs in last 24 hours: Temp:  [97.8 F (36.6 C)-98.9 F (37.2 C)] 98.5 F (36.9 C) (09/27 0533) Pulse Rate:  [79-90] 79 (09/27 0533) Resp:  [16-17] 17 (09/27 0533) BP: (121-143)/(63-79) 143/79 (09/27 0533) SpO2:  [95 %-97 %] 96 % (09/27 0533)  Intake/Output from previous day: 09/26 0701 - 09/27 0700 In: 239.7 [P.O.:120; I.V.:119.7] Out: 2975 [Urine:2975] Intake/Output this shift: Total I/O In: 239.7 [P.O.:120; I.V.:119.7] Out: 400 [Urine:400]  Recent Labs    03/01/22 0334 03/02/22 0347 03/03/22 0337  HGB 10.4* 10.3* 10.9*   Recent Labs    03/02/22 0347 03/03/22 0337  WBC 20.6* 20.4*  RBC 3.37* 3.54*  HCT 33.0* 35.1*  PLT 312 339   Recent Labs    03/01/22 0334 03/03/22 0337  NA 136 137  K 3.5 3.9  CL 99 102  CO2 29 27  BUN 19 13  CREATININE 0.51 0.58  GLUCOSE 95 88  CALCIUM 8.4* 8.6*   Recent Labs    03/02/22 0347 03/03/22 0337  INR 1.8* 2.6*    Neurologically intact Neurovascular intact Sensation intact distally Intact pulses distally Dorsiflexion/Plantar flexion intact Incision: dressing C/D/I- New dressings placed No cellulitis present Compartment soft   Assessment/Plan: 14 Days Post-Op Procedure(s) (LRB): TOTAL KNEE ARTHROPLASTY (Left) Up with therapy continue PT until able to get out of here Discharge to SNF hopeful for d/c today  Hospitalist waiting for INR to become therapeutic, 2.6 this am so hopeful for d/c today  If patient is able to be d/c today we will have her follow up in the office on Friday for her postop appointment    Drue Novel, PA-C [facility] with Dr. [name] [phone] 03/03/2022,  6:36 AM

## 2022-03-03 NOTE — TOC Transition Note (Signed)
Transition of Care [facility]) - CM/SW Discharge Note   Patient Details  Name: [name] MRN: [id number] Date of Birth: October 05, 1929  [facility] CM/SW Contact:  Lennart Pall, LCSW Phone Number: 03/03/2022, 11:03 AM   Clinical Narrative:    Pt medically cleared for dc to SNF today.  Pt and family accepted SNF bed at [facility] and have received insurance authorization [phone]).  PTAR called at 11:00am.  RN to call report to [phone].  No further TOC needs.   Final next level of care: Skilled Nursing Facility Barriers to Discharge: Barriers Resolved   Patient Goals and CMS Choice Patient states their goals for this hospitalization and ongoing recovery are:: To return home   Choice offered to / list presented to : Adult Children, Patient  Discharge Placement PASRR number recieved: 02/25/22            Patient chooses bed at: [facility] Patient to be transferred to facility by: [place] Name of family member notified: daughter, Edmonia Lynch Patient and family notified of of transfer: 03/03/22  Discharge Plan and Services In-house Referral: NA Discharge Planning Services: CM Consult Post Acute Care Choice: [place] ([place], [place] [phone])          DME Arranged: N/A DME Agency: NA                  Social Determinants of Health (SDOH) Interventions     Readmission Risk Interventions    02/25/2022    2:27 PM  Readmission Risk Prevention Plan  Transportation Screening Complete  PCP or Specialist Appt within 5-7 Days Complete  Home Care Screening Complete  Medication Review (RN CM) Complete
